# Patient Record
Sex: Female | Born: 1950 | Race: Black or African American | Hispanic: No | Marital: Married | Smoking: Former smoker
Health system: Southern US, Community
[De-identification: ages and names within clinical notes are randomized; demographics above are authoritative.]

## PROBLEM LIST (undated history)

## (undated) DIAGNOSIS — I10 Essential (primary) hypertension: Secondary | ICD-10-CM

## (undated) DIAGNOSIS — R5383 Other fatigue: Secondary | ICD-10-CM

## (undated) DIAGNOSIS — E739 Lactose intolerance, unspecified: Secondary | ICD-10-CM

## (undated) DIAGNOSIS — R5381 Other malaise: Secondary | ICD-10-CM

## (undated) DIAGNOSIS — F209 Schizophrenia, unspecified: Secondary | ICD-10-CM

## (undated) DIAGNOSIS — E785 Hyperlipidemia, unspecified: Secondary | ICD-10-CM

## (undated) DIAGNOSIS — G5600 Carpal tunnel syndrome, unspecified upper limb: Secondary | ICD-10-CM

## (undated) DIAGNOSIS — R7302 Impaired glucose tolerance (oral): Secondary | ICD-10-CM

## (undated) HISTORY — DX: Impaired glucose tolerance (oral): R73.02

## (undated) HISTORY — DX: Other fatigue: R53.83

## (undated) HISTORY — DX: Lactose intolerance, unspecified: E73.9

## (undated) HISTORY — DX: Carpal tunnel syndrome, unspecified upper limb: G56.00

## (undated) HISTORY — DX: Other malaise: R53.81

## (undated) HISTORY — DX: Schizophrenia, unspecified: F20.9

## (undated) HISTORY — DX: Essential (primary) hypertension: I10

## (undated) HISTORY — DX: Hyperlipidemia, unspecified: E78.5

---

## 1993-02-20 HISTORY — PX: OTHER SURGICAL HISTORY: SHX169

## 1997-02-20 HISTORY — PX: DILATION AND CURETTAGE OF UTERUS: SHX78

## 1998-01-28 ENCOUNTER — Ambulatory Visit (HOSPITAL_COMMUNITY): Admission: RE | Admit: 1998-01-28 | Discharge: 1998-01-28 | Payer: Self-pay | Admitting: Gynecology

## 1998-01-28 ENCOUNTER — Other Ambulatory Visit: Admission: RE | Admit: 1998-01-28 | Discharge: 1998-01-28 | Payer: Self-pay | Admitting: Gynecology

## 1998-02-01 ENCOUNTER — Ambulatory Visit (HOSPITAL_COMMUNITY): Admission: RE | Admit: 1998-02-01 | Discharge: 1998-02-01 | Payer: Self-pay | Admitting: Gynecology

## 1998-02-11 ENCOUNTER — Encounter: Payer: Self-pay | Admitting: Gynecology

## 1998-02-11 ENCOUNTER — Ambulatory Visit (HOSPITAL_COMMUNITY): Admission: RE | Admit: 1998-02-11 | Discharge: 1998-02-11 | Payer: Self-pay | Admitting: Gynecology

## 1998-02-15 ENCOUNTER — Ambulatory Visit (HOSPITAL_COMMUNITY): Admission: RE | Admit: 1998-02-15 | Discharge: 1998-02-15 | Payer: Self-pay | Admitting: Gynecology

## 2004-04-11 ENCOUNTER — Ambulatory Visit: Payer: Self-pay | Admitting: Family Medicine

## 2004-05-30 ENCOUNTER — Ambulatory Visit (HOSPITAL_COMMUNITY): Admission: RE | Admit: 2004-05-30 | Discharge: 2004-05-30 | Payer: Self-pay | Admitting: Internal Medicine

## 2005-04-17 ENCOUNTER — Ambulatory Visit: Payer: Self-pay | Admitting: Internal Medicine

## 2005-05-15 ENCOUNTER — Ambulatory Visit: Payer: Self-pay | Admitting: Internal Medicine

## 2005-06-26 ENCOUNTER — Ambulatory Visit: Payer: Self-pay | Admitting: Internal Medicine

## 2006-10-15 ENCOUNTER — Ambulatory Visit: Payer: Self-pay | Admitting: Internal Medicine

## 2006-12-18 ENCOUNTER — Telehealth (INDEPENDENT_AMBULATORY_CARE_PROVIDER_SITE_OTHER): Payer: Self-pay | Admitting: *Deleted

## 2007-10-15 ENCOUNTER — Encounter: Payer: Self-pay | Admitting: Internal Medicine

## 2007-12-13 ENCOUNTER — Encounter: Payer: Self-pay | Admitting: Internal Medicine

## 2008-06-25 ENCOUNTER — Ambulatory Visit: Payer: Self-pay | Admitting: Internal Medicine

## 2008-06-25 DIAGNOSIS — F209 Schizophrenia, unspecified: Secondary | ICD-10-CM

## 2008-06-25 DIAGNOSIS — E785 Hyperlipidemia, unspecified: Secondary | ICD-10-CM | POA: Insufficient documentation

## 2008-06-25 DIAGNOSIS — E739 Lactose intolerance, unspecified: Secondary | ICD-10-CM

## 2008-06-25 DIAGNOSIS — I10 Essential (primary) hypertension: Secondary | ICD-10-CM

## 2008-06-25 HISTORY — DX: Essential (primary) hypertension: I10

## 2008-06-25 HISTORY — DX: Lactose intolerance, unspecified: E73.9

## 2008-06-25 HISTORY — DX: Schizophrenia, unspecified: F20.9

## 2008-06-25 HISTORY — DX: Hyperlipidemia, unspecified: E78.5

## 2008-08-27 ENCOUNTER — Ambulatory Visit: Payer: Self-pay | Admitting: Internal Medicine

## 2008-08-27 DIAGNOSIS — R5381 Other malaise: Secondary | ICD-10-CM

## 2008-08-27 DIAGNOSIS — R5383 Other fatigue: Secondary | ICD-10-CM

## 2008-08-27 HISTORY — DX: Other malaise: R53.81

## 2008-08-27 LAB — CONVERTED CEMR LAB
ALT: 18 units/L (ref 0–35)
AST: 20 units/L (ref 0–37)
Albumin: 3.9 g/dL (ref 3.5–5.2)
Alkaline Phosphatase: 51 units/L (ref 39–117)
BUN: 15 mg/dL (ref 6–23)
Basophils Absolute: 0 10*3/uL (ref 0.0–0.1)
Basophils Relative: 0.4 % (ref 0.0–3.0)
Bilirubin Urine: NEGATIVE
Bilirubin, Direct: 0.1 mg/dL (ref 0.0–0.3)
CO2: 33 meq/L — ABNORMAL HIGH (ref 19–32)
Calcium: 9.5 mg/dL (ref 8.4–10.5)
Chloride: 101 meq/L (ref 96–112)
Cholesterol: 157 mg/dL (ref 0–200)
Creatinine, Ser: 1.3 mg/dL — ABNORMAL HIGH (ref 0.4–1.2)
Eosinophils Absolute: 0.1 10*3/uL (ref 0.0–0.7)
Eosinophils Relative: 1.2 % (ref 0.0–5.0)
Folate: 6.1 ng/mL
GFR calc non Af Amer: 54.52 mL/min (ref >60)
Glucose, Bld: 106 mg/dL — ABNORMAL HIGH (ref 70–99)
HCT: 41.1 % (ref 36.0–46.0)
HDL: 53.2 mg/dL (ref >39.00)
Hemoglobin, Urine: NEGATIVE
Hemoglobin: 13.8 g/dL (ref 12.0–15.0)
Hgb A1c MFr Bld: 5.9 % (ref 4.6–6.5)
Ketones, ur: NEGATIVE mg/dL
LDL Cholesterol: 77 mg/dL (ref 0–99)
Lymphocytes Relative: 34.8 % (ref 12.0–46.0)
Lymphs Abs: 2.4 10*3/uL (ref 0.7–4.0)
MCHC: 33.5 g/dL (ref 30.0–36.0)
MCV: 91.3 fL (ref 78.0–100.0)
Monocytes Absolute: 0.6 10*3/uL (ref 0.1–1.0)
Monocytes Relative: 9.4 % (ref 3.0–12.0)
Neutro Abs: 3.8 10*3/uL (ref 1.4–7.7)
Neutrophils Relative %: 54.2 % (ref 43.0–77.0)
Nitrite: NEGATIVE
Platelets: 216 10*3/uL (ref 150.0–400.0)
Potassium: 3.4 meq/L — ABNORMAL LOW (ref 3.5–5.1)
RBC: 4.51 M/uL (ref 3.87–5.11)
RDW: 13.1 % (ref 11.5–14.6)
Sed Rate: 12 mm/hr (ref 0–22)
Sodium: 143 meq/L (ref 135–145)
Specific Gravity, Urine: 1.02 (ref 1.000–1.030)
TSH: 2.17 microintl units/mL (ref 0.35–5.50)
Total Bilirubin: 0.8 mg/dL (ref 0.3–1.2)
Total CHOL/HDL Ratio: 3
Total Protein, Urine: NEGATIVE mg/dL
Total Protein: 7.3 g/dL (ref 6.0–8.3)
Triglycerides: 134 mg/dL (ref 0.0–149.0)
Urine Glucose: NEGATIVE mg/dL
Urobilinogen, UA: 0.2 (ref 0.0–1.0)
VLDL: 26.8 mg/dL (ref 0.0–40.0)
Vitamin B-12: 222 pg/mL (ref 211–911)
WBC: 6.9 10*3/uL (ref 4.5–10.5)
pH: 5.5 (ref 5.0–8.0)

## 2009-04-20 ENCOUNTER — Telehealth: Payer: Self-pay | Admitting: Internal Medicine

## 2009-06-07 ENCOUNTER — Ambulatory Visit (HOSPITAL_COMMUNITY): Admission: RE | Admit: 2009-06-07 | Discharge: 2009-06-07 | Payer: Self-pay | Admitting: Internal Medicine

## 2009-06-07 LAB — HM MAMMOGRAPHY: HM Mammogram: NEGATIVE

## 2010-03-11 ENCOUNTER — Telehealth: Payer: Self-pay | Admitting: Internal Medicine

## 2010-03-22 NOTE — Progress Notes (Signed)
    Immunization History:  Influenza Immunization History:    Influenza:  historical (02/09/2009)

## 2010-03-24 NOTE — Progress Notes (Signed)
Summary: Rx refill req  Phone Note Refill Request Message from:  Patient on March 11, 2010 10:06 AM  Refills Requested: Medication #1:  LOVASTATIN 40 MG  TABS 1 by mouth once daily   Dosage confirmed as above?Dosage Confirmed   Supply Requested: 3 months  Medication #2:  LISINOPRIL-HYDROCHLOROTHIAZIDE 20-12.5 MG TABS 2 by mouth once daily   Dosage confirmed as above?Dosage Confirmed   Supply Requested: 3 months Pt has appt scheduled in April 2012   Method Requested: Electronic Initial call taken by: Margaret Pyle, CMA,  March 11, 2010 10:06 AM    Prescriptions: LISINOPRIL-HYDROCHLOROTHIAZIDE 20-12.5 MG TABS (LISINOPRIL-HYDROCHLOROTHIAZIDE) 2 by mouth once daily  #180 Tablet x 2   Entered by:   Margaret Pyle, CMA   Authorized by:   Corwin Levins MD   Signed by:   Margaret Pyle, CMA on 03/11/2010   Method used:   Electronically to        [facility]  [address] [phone]* (retail)       [address]       [place], [place]  [id number]       Ph: [phone] or [phone]       Fax: [phone]   RxID:   [id number] LOVASTATIN 40 MG  TABS (LOVASTATIN) 1 by mouth once daily  #30 Tablet x 2   Entered by:   Margaret Pyle, CMA   Authorized by:   Corwin Levins MD   Signed by:   Margaret Pyle, CMA on 03/11/2010   Method used:   [facility]ically to        [facility]  [address] [phone]* (retail)       [address]       [place], [place]  [id number]       Ph: [phone] or [phone]       Fax: [phone]   RxID:   [id number]

## 2010-06-12 ENCOUNTER — Encounter: Payer: Self-pay | Admitting: Internal Medicine

## 2010-06-12 DIAGNOSIS — R7302 Impaired glucose tolerance (oral): Secondary | ICD-10-CM

## 2010-06-12 DIAGNOSIS — Z0000 Encounter for general adult medical examination without abnormal findings: Secondary | ICD-10-CM | POA: Insufficient documentation

## 2010-06-12 HISTORY — DX: Impaired glucose tolerance (oral): R73.02

## 2010-06-15 ENCOUNTER — Encounter: Payer: Self-pay | Admitting: Internal Medicine

## 2010-06-15 ENCOUNTER — Other Ambulatory Visit (INDEPENDENT_AMBULATORY_CARE_PROVIDER_SITE_OTHER): Payer: Medicare Other

## 2010-06-15 ENCOUNTER — Ambulatory Visit (INDEPENDENT_AMBULATORY_CARE_PROVIDER_SITE_OTHER): Payer: Medicare Other | Admitting: Internal Medicine

## 2010-06-15 VITALS — BP 108/78 | HR 77 | Temp 98.0°F | Ht 67.0 in | Wt 180.0 lb

## 2010-06-15 DIAGNOSIS — R7309 Other abnormal glucose: Secondary | ICD-10-CM

## 2010-06-15 DIAGNOSIS — R7302 Impaired glucose tolerance (oral): Secondary | ICD-10-CM

## 2010-06-15 DIAGNOSIS — R5381 Other malaise: Secondary | ICD-10-CM

## 2010-06-15 DIAGNOSIS — E785 Hyperlipidemia, unspecified: Secondary | ICD-10-CM

## 2010-06-15 DIAGNOSIS — R5383 Other fatigue: Secondary | ICD-10-CM

## 2010-06-15 DIAGNOSIS — I10 Essential (primary) hypertension: Secondary | ICD-10-CM

## 2010-06-15 LAB — HEPATIC FUNCTION PANEL
AST: 15 U/L (ref 0–37)
Albumin: 4 g/dL (ref 3.5–5.2)
Total Bilirubin: 1 mg/dL (ref 0.3–1.2)

## 2010-06-15 LAB — BASIC METABOLIC PANEL
BUN: 12 mg/dL (ref 6–23)
Calcium: 9.7 mg/dL (ref 8.4–10.5)
Creatinine, Ser: 1.2 mg/dL (ref 0.4–1.2)
GFR: 60.57 mL/min (ref >60.00)
Potassium: 3.6 mEq/L (ref 3.5–5.1)

## 2010-06-15 LAB — CBC WITH DIFFERENTIAL/PLATELET
Basophils Relative: 0.3 % (ref 0.0–3.0)
Eosinophils Relative: 0.6 % (ref 0.0–5.0)
HCT: 42.3 % (ref 36.0–46.0)
Hemoglobin: 14.5 g/dL (ref 12.0–15.0)
Lymphs Abs: 1.6 10*3/uL (ref 0.7–4.0)
MCV: 89.4 fl (ref 78.0–100.0)
Monocytes Absolute: 0.6 10*3/uL (ref 0.1–1.0)
Monocytes Relative: 9.6 % (ref 3.0–12.0)
Neutro Abs: 4 10*3/uL (ref 1.4–7.7)
RBC: 4.73 Mil/uL (ref 3.87–5.11)
WBC: 6.3 10*3/uL (ref 4.5–10.5)

## 2010-06-15 MED ORDER — BENZTROPINE MESYLATE 2 MG PO TABS: 2.0000 mg | ORAL_TABLET | Freq: Every day | ORAL | Status: DC

## 2010-06-15 MED ORDER — LOVASTATIN 40 MG PO TABS: 40.0000 mg | ORAL_TABLET | Freq: Every day | ORAL | Status: DC

## 2010-06-15 MED ORDER — LISINOPRIL-HYDROCHLOROTHIAZIDE 20-12.5 MG PO TABS: 2.0000 | ORAL_TABLET | Freq: Every day | ORAL | Status: DC

## 2010-06-15 NOTE — Assessment & Plan Note (Signed)
stable overall by hx and exam, most recent lab reviewed with pt, and pt to continue medical treatment as before  Lab Results  Component Value Date   WBC 6.9 08/27/2008   HGB 13.8 08/27/2008   HCT 41.1 08/27/2008   PLT 216.0 08/27/2008   CHOL 157 08/27/2008   TRIG 134.0 08/27/2008   HDL 53.20 08/27/2008   ALT 18 08/27/2008   AST 20 08/27/2008   NA 143 08/27/2008   K 3.4* 08/27/2008   CL 101 08/27/2008   CREATININE 1.3* 08/27/2008   BUN 15 08/27/2008   CO2 33* 08/27/2008   TSH 2.17 08/27/2008   HGBA1C 5.9 08/27/2008   To re-check labs today

## 2010-06-15 NOTE — Assessment & Plan Note (Signed)
stable overall by hx and exam, most recent lab reviewed with pt, and pt to continue medical treatment as before  Lab Results  Component Value Date   LDLCALC 77 08/27/2008   Ton cont diet and meds

## 2010-06-15 NOTE — Progress Notes (Signed)
Quick Note:  Voice message left on [employer] system - lab is negative, normal or otherwise stable, pt to continue same tx ______ 

## 2010-06-15 NOTE — Patient Instructions (Addendum)
Continue all other medications as before Please keep your appointments with your specialists as you have planned - mental health Please go to LAB in the Basement for the blood and/or urine tests to be done today Please call the number on the Blue Card (the [employer] System) for results of testing in 2-3 days Please return in 1 year for your yearly visit, or sooner if needed Your prescriptions were sent to the pharmacy (all except the haldol)

## 2010-06-15 NOTE — Progress Notes (Signed)
  Subjective:    Patient ID: [name] [name], female    DOB: [birth year], 60 y.o.   MRN: [id number]  HPI  Here to f/u; overall doing ok,  Pt denies chest pain, increased sob or doe, wheezing, orthopnea, PND, increased LE swelling, palpitations, dizziness or syncope.  Pt denies new neurological symptoms such as new headache, or facial or extremity weakness or numbness   Pt denies polydipsia, polyuria, or low sugar symptoms such as weakness or confusion improved with po intake.  Pt states overall good compliance with meds, trying to follow lower cholesterol, diabetic diet, wt overall stable but little exercise however.  Does have sense of ongoing fatigue, but denies signficant hypersomnolence.  Pt denies fever, wt loss, night sweats, loss of appetite, or other constitutional symptoms  Overall good compliance with treatment, and good medicine tolerability.  Denies worsening depressive symptoms, suicidal ideation, or panic Past Medical History  Diagnosis Date  . CTS (carpal tunnel syndrome)     bilateral  . GLUCOSE INTOLERANCE 06/25/2008  . HYPERLIPIDEMIA 06/25/2008  . SCHIZOPHRENIA 06/25/2008  . HYPERTENSION 06/25/2008  . FATIGUE 08/27/2008  . Impaired glucose tolerance 06/12/2010   Past Surgical History  Procedure Date  . S/p cervical procedure 1995    LEEP  . Dilation and curettage of uterus 1999    hx of after miscarriage    reports that she has quit smoking. She does not have any smokeless tobacco history on file. She reports that she does not drink alcohol. Her drug history not on file. family history includes Hypertension in her other and Stroke in her other. Allergies  Allergen Reactions  . Nabumetone     REACTION: rash   Current Outpatient Prescriptions on File Prior to Visit  Medication Sig Dispense Refill  . aspirin 81 MG EC tablet Take 81 mg by mouth daily.        . haloperidol (HALDOL) 10 MG tablet Take 10 mg by mouth daily.          Review of Systems Review of Systems  Constitutional:  Negative for diaphoresis and unexpected weight change.  HENT: Negative for drooling and tinnitus.   Eyes: Negative for photophobia and visual disturbance.  Respiratory: Negative for choking and stridor.   Gastrointestinal: Negative for vomiting and blood in stool.  Genitourinary: Negative for hematuria and decreased urine volume.  Musculoskeletal: Negative for gait problem.  Skin: Negative for color change and wound.  Neurological: Negative for tremors and numbness.  Psychiatric/Behavioral: Negative for decreased concentration. The patient is not hyperactive.       Objective:   Physical Exam BP 108/78  Pulse 77  Temp(Src) 98 F (36.7 C) (Oral)  Ht 5\' 7"  (1.702 m)  Wt 180 lb (81.647 kg)  BMI 28.19 kg/m2  SpO2 97% Physical Exam  VS noted Constitutional: Pt appears well-developed and well-nourished.  HENT: Head: Normocephalic.  Right Ear: External ear normal.  Left Ear: External ear normal.  Eyes: Conjunctivae and EOM are normal. Pupils are equal, round, and reactive to light.  Neck: Normal range of motion. Neck supple.  Cardiovascular: Normal rate and regular rhythm.   Pulmonary/Chest: Effort normal and breath sounds normal.  Abd:  Soft, NT, non-distended, + BS Neurological: Pt is alert. No cranial nerve deficit.  Skin: Skin is warm. No erythema.  Psychiatric: Pt behavior is normal. 1+ nervous, not depressed appearing        Assessment & Plan:

## 2010-06-15 NOTE — Assessment & Plan Note (Signed)
stable overall by hx and exam, most recent lab reviewed with pt, and pt to continue medical treatment as before  Lab Results  Component Value Date   HGBA1C 5.9 08/27/2008

## 2010-06-15 NOTE — Assessment & Plan Note (Signed)
Etiology unclear, Exam otherwise benign, to check labs as documented, follow with expectant management  

## 2010-06-19 ENCOUNTER — Encounter: Payer: Self-pay | Admitting: Internal Medicine

## 2011-01-17 ENCOUNTER — Other Ambulatory Visit: Payer: Self-pay | Admitting: Internal Medicine

## 2011-07-23 ENCOUNTER — Other Ambulatory Visit: Payer: Self-pay | Admitting: Internal Medicine

## 2011-08-23 ENCOUNTER — Other Ambulatory Visit: Payer: Self-pay | Admitting: Internal Medicine

## 2011-09-19 ENCOUNTER — Other Ambulatory Visit: Payer: Self-pay | Admitting: Internal Medicine

## 2011-10-11 ENCOUNTER — Ambulatory Visit (INDEPENDENT_AMBULATORY_CARE_PROVIDER_SITE_OTHER): Payer: Medicare Other | Admitting: Internal Medicine

## 2011-10-11 ENCOUNTER — Other Ambulatory Visit (INDEPENDENT_AMBULATORY_CARE_PROVIDER_SITE_OTHER): Payer: Medicare Other

## 2011-10-11 VITALS — BP 122/80 | HR 70 | Temp 97.3°F | Ht 68.0 in | Wt 177.5 lb

## 2011-10-11 DIAGNOSIS — R7302 Impaired glucose tolerance (oral): Secondary | ICD-10-CM

## 2011-10-11 DIAGNOSIS — I10 Essential (primary) hypertension: Secondary | ICD-10-CM

## 2011-10-11 DIAGNOSIS — E785 Hyperlipidemia, unspecified: Secondary | ICD-10-CM

## 2011-10-11 DIAGNOSIS — Z136 Encounter for screening for cardiovascular disorders: Secondary | ICD-10-CM

## 2011-10-11 DIAGNOSIS — R7309 Other abnormal glucose: Secondary | ICD-10-CM

## 2011-10-11 LAB — CBC WITH DIFFERENTIAL/PLATELET
Basophils Relative: 0.5 % (ref 0.0–3.0)
Eosinophils Relative: 0.7 % (ref 0.0–5.0)
HCT: 43.7 % (ref 36.0–46.0)
MCV: 91.9 fl (ref 78.0–100.0)
Monocytes Absolute: 0.5 10*3/uL (ref 0.1–1.0)
Monocytes Relative: 8.4 % (ref 3.0–12.0)
Neutrophils Relative %: 61.4 % (ref 43.0–77.0)
RBC: 4.76 Mil/uL (ref 3.87–5.11)
WBC: 6.6 10*3/uL (ref 4.5–10.5)

## 2011-10-11 LAB — URINALYSIS, ROUTINE W REFLEX MICROSCOPIC
Bilirubin Urine: NEGATIVE
Nitrite: NEGATIVE
Specific Gravity, Urine: 1.01 (ref 1.000–1.030)
Total Protein, Urine: NEGATIVE
pH: 6 (ref 5.0–8.0)

## 2011-10-11 LAB — BASIC METABOLIC PANEL
BUN: 10 mg/dL (ref 6–23)
Creatinine, Ser: 1.2 mg/dL (ref 0.4–1.2)
GFR: 56.94 mL/min — ABNORMAL LOW (ref >60.00)

## 2011-10-11 LAB — LIPID PANEL
LDL Cholesterol: 59 mg/dL (ref 0–99)
Total CHOL/HDL Ratio: 2
Triglycerides: 91 mg/dL (ref 0.0–149.0)

## 2011-10-11 LAB — HEPATIC FUNCTION PANEL
ALT: 15 U/L (ref 0–35)
Total Bilirubin: 0.8 mg/dL (ref 0.3–1.2)

## 2011-10-11 MED ORDER — LOVASTATIN 40 MG PO TABS: 40.0000 mg | ORAL_TABLET | Freq: Every day | ORAL | Status: DC

## 2011-10-11 MED ORDER — LISINOPRIL-HYDROCHLOROTHIAZIDE 20-12.5 MG PO TABS: ORAL_TABLET | ORAL | Status: DC

## 2011-10-11 NOTE — Assessment & Plan Note (Signed)
With recent wt loss likely improved, for a1c

## 2011-10-11 NOTE — Assessment & Plan Note (Addendum)
ECG reviewed as per emr, stable overall by hx and exam, most recent data reviewed with pt, and pt to continue medical treatment as before BP Readings from Last 3 Encounters:  10/11/11 122/80  06/15/10 108/78  08/27/08 128/72

## 2011-10-11 NOTE — Patient Instructions (Addendum)
Please remember to followup with your GYN for the yearly pap smear and/or mammogram Very good on the weight loss from last year! Please go to LAB in the Basement for the blood and/or urine tests to be done today You will be contacted by phone if any changes need to be made immediately.  Otherwise, you will receive a letter about your results with an explanation. Your medications were refilled today Please have the pharmacy call with any refills you may need. Please continue your efforts at being more active, low cholesterol diet, and weight control. Please return in 1 year for your yearly visit, or sooner if needed

## 2011-10-14 ENCOUNTER — Encounter: Payer: Self-pay | Admitting: Internal Medicine

## 2011-10-14 NOTE — Assessment & Plan Note (Signed)
stable overall by hx and exam, most recent data reviewed with pt, and pt to continue medical treatment as before Lab Results  Component Value Date   LDLCALC 59 10/11/2011

## 2011-10-14 NOTE — Progress Notes (Signed)
Subjective:    Patient ID: [name] [name], female    DOB: 13-Apr-1950, 61 y.o.   MRN: [id number]  HPI  Here to f/u; overall doing ok,  Pt denies chest pain, increased sob or doe, wheezing, orthopnea, PND, increased LE swelling, palpitations, dizziness or syncope.  Pt denies new neurological symptoms such as new headache, or facial or extremity weakness or numbness   Pt denies polydipsia, polyuria, or low sugar symptoms such as weakness or confusion improved with po intake.  Pt states overall good compliance with meds, trying to follow lower cholesterol, diabetic diet, wt overall down not to 177 with better diet, but little exercise however.  Needs med refills.   Pt denies fever, night sweats, loss of appetite, or other constitutional symptoms  Overall good compliance with treatment, and good medicine tolerability.  No new complaints Past Medical History  Diagnosis Date  . CTS (carpal tunnel syndrome)     bilateral  . GLUCOSE INTOLERANCE 06/25/2008  . HYPERLIPIDEMIA 06/25/2008  . SCHIZOPHRENIA 06/25/2008  . HYPERTENSION 06/25/2008  . FATIGUE 08/27/2008  . Impaired glucose tolerance 06/12/2010   Past Surgical History  Procedure Date  . S/p cervical procedure 1995    LEEP  . Dilation and curettage of uterus 1999    hx of after miscarriage    reports that she has quit smoking. She does not have any smokeless tobacco history on file. She reports that she does not drink alcohol. Her drug history not on file. family history includes Hypertension in her other and Stroke in her other. Allergies  Allergen Reactions  . Nabumetone     REACTION: rash   Current Outpatient Prescriptions on File Prior to Visit  Medication Sig Dispense Refill  . aspirin 81 MG EC tablet Take 81 mg by mouth daily.        . benztropine (COGENTIN) 2 MG tablet Take 1 tablet (2 mg total) by mouth daily.  90 tablet  3  . haloperidol (HALDOL) 10 MG tablet Take 10 mg by mouth daily.        Marland Kitchen lisinopril-hydrochlorothiazide  (PRINZIDE,ZESTORETIC) 20-12.5 MG per tablet TAKE 2 TABLETS BY MOUTH DAILY  180 tablet  3  . lovastatin (MEVACOR) 40 MG tablet Take 1 tablet (40 mg total) by mouth daily.  90 tablet  3   Review of Systems Review of Systems  Constitutional: Negative for diaphoresis and unexpected weight change.  HENT: Negative for drooling and tinnitus.   Eyes: Negative for photophobia and visual disturbance.  Respiratory: Negative for choking and stridor.   Gastrointestinal: Negative for vomiting and blood in stool.  Genitourinary: Negative for hematuria and decreased urine volume.  Musculoskeletal: Negative for gait problem.  Skin: Negative for color change and wound.  Neurological: Negative for tremors and numbness.  Psychiatric/Behavioral: Negative for decreased concentration. The patient is not hyperactive.      Objective:   Physical Exam BP 122/80  Pulse 70  Temp 97.3 F (36.3 C) (Oral)  Ht 5\' 8"  (1.727 m)  Wt 177 lb 8 oz (80.513 kg)  BMI 26.99 kg/m2  SpO2 97% Physical Exam  VS noted Constitutional: Pt appears well-developed and well-nourished.  HENT: Head: Normocephalic.  Right Ear: External ear normal.  Left Ear: External ear normal.  Eyes: Conjunctivae and EOM are normal. Pupils are equal, round, and reactive to light.  Neck: Normal range of motion. Neck supple.  Cardiovascular: Normal rate and regular rhythm.   Pulmonary/Chest: Effort normal and breath sounds normal.  Abd:  Soft, NT,  non-distended, + BS Neurological: Pt is alert. Not confused.  Skin: Skin is warm. No erythema.  Psychiatric: Pt behavior is normal. Thought content normal.     Assessment & Plan:

## 2011-11-03 ENCOUNTER — Other Ambulatory Visit: Payer: Self-pay | Admitting: Internal Medicine

## 2012-02-20 ENCOUNTER — Other Ambulatory Visit: Payer: Self-pay

## 2012-02-20 MED ORDER — LISINOPRIL-HYDROCHLOROTHIAZIDE 20-12.5 MG PO TABS: 2.0000 | ORAL_TABLET | Freq: Every day | ORAL | Status: DC

## 2012-10-28 ENCOUNTER — Other Ambulatory Visit: Payer: Self-pay | Admitting: Internal Medicine

## 2012-10-29 ENCOUNTER — Other Ambulatory Visit: Payer: Self-pay

## 2012-10-29 MED ORDER — LISINOPRIL-HYDROCHLOROTHIAZIDE 20-12.5 MG PO TABS: 2.0000 | ORAL_TABLET | Freq: Every day | ORAL | Status: DC

## 2012-12-16 ENCOUNTER — Other Ambulatory Visit: Payer: Self-pay | Admitting: Internal Medicine

## 2012-12-17 ENCOUNTER — Telehealth: Payer: Self-pay | Admitting: Internal Medicine

## 2012-12-17 MED ORDER — LOVASTATIN 40 MG PO TABS: 40.0000 mg | ORAL_TABLET | Freq: Every day | ORAL | Status: DC

## 2012-12-17 NOTE — Telephone Encounter (Signed)
Called the patient informed that she needs to schedule appointment  To continue getting refills.  The patient agreed to do so and I did send in #30 days of lovastatin to her pharmacy.

## 2012-12-17 NOTE — Telephone Encounter (Signed)
12/17/2012  Pt left message requesting refill for RX - lovastatin.  Contact # [phone]

## 2012-12-25 ENCOUNTER — Ambulatory Visit: Payer: Medicare Other | Admitting: Internal Medicine

## 2013-01-29 ENCOUNTER — Other Ambulatory Visit: Payer: Self-pay | Admitting: Internal Medicine

## 2013-02-06 ENCOUNTER — Other Ambulatory Visit: Payer: Self-pay | Admitting: Internal Medicine

## 2013-02-07 ENCOUNTER — Other Ambulatory Visit: Payer: Self-pay | Admitting: *Deleted

## 2013-02-07 MED ORDER — LOVASTATIN 40 MG PO TABS: 40.0000 mg | ORAL_TABLET | Freq: Every day | ORAL | Status: DC

## 2013-02-12 ENCOUNTER — Other Ambulatory Visit (INDEPENDENT_AMBULATORY_CARE_PROVIDER_SITE_OTHER): Payer: Medicare Other

## 2013-02-12 ENCOUNTER — Encounter: Payer: Self-pay | Admitting: Internal Medicine

## 2013-02-12 ENCOUNTER — Ambulatory Visit (INDEPENDENT_AMBULATORY_CARE_PROVIDER_SITE_OTHER): Payer: Medicare Other | Admitting: Internal Medicine

## 2013-02-12 VITALS — BP 114/80 | HR 80 | Temp 97.7°F | Ht 68.0 in | Wt 174.2 lb

## 2013-02-12 DIAGNOSIS — Z0000 Encounter for general adult medical examination without abnormal findings: Secondary | ICD-10-CM

## 2013-02-12 DIAGNOSIS — R7309 Other abnormal glucose: Secondary | ICD-10-CM

## 2013-02-12 DIAGNOSIS — E785 Hyperlipidemia, unspecified: Secondary | ICD-10-CM

## 2013-02-12 DIAGNOSIS — R7302 Impaired glucose tolerance (oral): Secondary | ICD-10-CM

## 2013-02-12 DIAGNOSIS — Z23 Encounter for immunization: Secondary | ICD-10-CM

## 2013-02-12 DIAGNOSIS — I10 Essential (primary) hypertension: Secondary | ICD-10-CM

## 2013-02-12 LAB — CBC WITH DIFFERENTIAL/PLATELET
Basophils Absolute: 0 10*3/uL (ref 0.0–0.1)
Basophils Relative: 0.3 % (ref 0.0–3.0)
Eosinophils Absolute: 0 10*3/uL (ref 0.0–0.7)
Hemoglobin: 14.9 g/dL (ref 12.0–15.0)
MCHC: 33.4 g/dL (ref 30.0–36.0)
MCV: 88.3 fl (ref 78.0–100.0)
Monocytes Absolute: 0.5 10*3/uL (ref 0.1–1.0)
Monocytes Relative: 8.3 % (ref 3.0–12.0)
Neutro Abs: 3.7 10*3/uL (ref 1.4–7.7)
RBC: 5.04 Mil/uL (ref 3.87–5.11)
RDW: 14.2 % (ref 11.5–14.6)

## 2013-02-12 LAB — URINALYSIS, ROUTINE W REFLEX MICROSCOPIC
Bilirubin Urine: NEGATIVE
Hgb urine dipstick: NEGATIVE
Ketones, ur: NEGATIVE
Specific Gravity, Urine: 1.01 (ref 1.000–1.030)
Total Protein, Urine: NEGATIVE
Urine Glucose: NEGATIVE
Urobilinogen, UA: 0.2 (ref 0.0–1.0)

## 2013-02-12 LAB — BASIC METABOLIC PANEL
BUN: 9 mg/dL (ref 6–23)
CO2: 30 mEq/L (ref 19–32)
Calcium: 9.6 mg/dL (ref 8.4–10.5)
Chloride: 100 mEq/L (ref 96–112)
Creatinine, Ser: 1.1 mg/dL (ref 0.4–1.2)
Sodium: 138 mEq/L (ref 135–145)

## 2013-02-12 LAB — LIPID PANEL
Cholesterol: 183 mg/dL (ref 0–200)
Triglycerides: 105 mg/dL (ref 0.0–149.0)

## 2013-02-12 LAB — HEPATIC FUNCTION PANEL
ALT: 13 U/L (ref 0–35)
Alkaline Phosphatase: 61 U/L (ref 39–117)
Bilirubin, Direct: 0.2 mg/dL (ref 0.0–0.3)
Total Bilirubin: 0.8 mg/dL (ref 0.3–1.2)
Total Protein: 7.6 g/dL (ref 6.0–8.3)

## 2013-02-12 MED ORDER — ESCITALOPRAM OXALATE 10 MG PO TABS: 5.0000 mg | ORAL_TABLET | Freq: Every day | ORAL | Status: DC

## 2013-02-12 MED ORDER — LOVASTATIN 40 MG PO TABS: 40.0000 mg | ORAL_TABLET | Freq: Every day | ORAL | Status: DC

## 2013-02-12 MED ORDER — LISINOPRIL-HYDROCHLOROTHIAZIDE 20-12.5 MG PO TABS: 2.0000 | ORAL_TABLET | Freq: Every day | ORAL | Status: DC

## 2013-02-12 NOTE — Patient Instructions (Addendum)
You had the tetanus shot today (The Td shot) No new medications or treatment today Please continue all other medications as before, and refills have been done if requested. Please have the pharmacy call with any other refills you may need. Please continue your efforts at being more active, low cholesterol diet, and weight control. You are otherwise up to date with prevention measures today.  Please go to the LAB in the Basement (turn left off the elevator) for the tests to be done today You will be contacted by phone if any changes need to be made immediately.  Otherwise, you will receive a letter about your results with an explanation, but please check with MyChart first.  You will be contacted regarding the referral for: screening mammogram at [facility]  Please keep your appointments with your specialists as you have planned - psychiatry  Please return in 1 year for your yearly visit, or sooner if needed

## 2013-02-12 NOTE — Assessment & Plan Note (Signed)
stable overall by history and exam, recent data reviewed with pt, and pt to continue medical treatment as before,  to f/u any worsening symptoms or concerns Lab Results  Component Value Date   LDLCALC 59 10/11/2011

## 2013-02-12 NOTE — Addendum Note (Signed)
Addended by: Scharlene Gloss B on: 02/12/2013 09:39 AM   Modules accepted: Orders

## 2013-02-12 NOTE — Assessment & Plan Note (Signed)
stable overall by history and exam, recent data reviewed with pt, and pt to continue medical treatment as before,  to f/u any worsening symptoms or concerns Lab Results  Component Value Date   HGBA1C 5.7 10/11/2011

## 2013-02-12 NOTE — Assessment & Plan Note (Signed)
stable overall by history and exam, recent data reviewed with pt, and pt to continue medical treatment as before,  to f/u any worsening symptoms or concerns BP Readings from Last 3 Encounters:  02/12/13 114/80  10/11/11 122/80  06/15/10 108/78

## 2013-02-12 NOTE — Progress Notes (Signed)
Subjective:    Patient ID: [name] [name], female    DOB: Jan 31, 1951, 62 y.o.   MRN: [id number]  HPI  Here for yearly f/u;  Overall doing ok;  Pt denies CP, worsening SOB, DOE, wheezing, orthopnea, PND, worsening LE edema, palpitations, dizziness or syncope.  Pt denies neurological change such as new headache, facial or extremity weakness.  Pt denies polydipsia, polyuria, or low sugar symptoms. Pt states overall good compliance with treatment and medications, good tolerability, and has been trying to follow lower cholesterol diet.  Pt denies worsening depressive symptoms, suicidal ideation or panic. No fever, night sweats, wt loss, loss of appetite, or other constitutional symptoms.  Pt states good ability with ADL's, has low fall risk, home safety reviewed and adequate, no other significant changes in hearing or vision, and only occasionally active with exercise. No new complaints. Now on lexapro 5 qd per psychiatry Past Medical History  Diagnosis Date  . CTS (carpal tunnel syndrome)     bilateral  . GLUCOSE INTOLERANCE 06/25/2008  . HYPERLIPIDEMIA 06/25/2008  . SCHIZOPHRENIA 06/25/2008  . HYPERTENSION 06/25/2008  . FATIGUE 08/27/2008  . Impaired glucose tolerance 06/12/2010   Past Surgical History  Procedure Laterality Date  . S/p cervical procedure  1995    LEEP  . Dilation and curettage of uterus  1999    hx of after miscarriage    reports that she has quit smoking. She does not have any smokeless tobacco history on file. She reports that she does not drink alcohol. Her drug history is not on file. family history includes Hypertension in her other; Stroke in her other. Allergies  Allergen Reactions  . Nabumetone     REACTION: rash   Current Outpatient Prescriptions on File Prior to Visit  Medication Sig Dispense Refill  . aspirin 81 MG EC tablet Take 81 mg by mouth daily.        . benztropine (COGENTIN) 2 MG tablet Take 1 tablet (2 mg total) by mouth daily.  90 tablet  3  . haloperidol  (HALDOL) 10 MG tablet Take 10 mg by mouth daily.         No current facility-administered medications on file prior to visit.   Review of Systems Constitutional: Negative for diaphoresis, activity change, appetite change or unexpected weight change.  HENT: Negative for hearing loss, ear pain, facial swelling, mouth sores and neck stiffness.   Eyes: Negative for pain, redness and visual disturbance.  Respiratory: Negative for shortness of breath and wheezing.   Cardiovascular: Negative for chest pain and palpitations.  Gastrointestinal: Negative for diarrhea, blood in stool, abdominal distention or other pain Genitourinary: Negative for hematuria, flank pain or change in urine volume.  Musculoskeletal: Negative for myalgias and joint swelling.  Skin: Negative for color change and wound.  Neurological: Negative for syncope and numbness. other than noted Hematological: Negative for adenopathy.  Psychiatric/Behavioral: Negative for hallucinations, self-injury, decreased concentration and agitation.      Objective:   Physical Exam BP 114/80  Pulse 80  Temp(Src) 97.7 F (36.5 C) (Oral)  Ht 5\' 8"  (1.727 m)  Wt 174 lb 4 oz (79.039 kg)  BMI 26.50 kg/m2  SpO2 96% VS noted,  Constitutional: Pt is oriented to person, place, and time. Appears well-developed and well-nourished.  Head: Normocephalic and atraumatic.  Right Ear: External ear normal.  Left Ear: External ear normal.  Nose: Nose normal.  Mouth/Throat: Oropharynx is clear and moist.  Eyes: Conjunctivae and EOM are normal. Pupils are equal,  round, and reactive to light.  Neck: Normal range of motion. Neck supple. No JVD present. No tracheal deviation present.  Cardiovascular: Normal rate, regular rhythm, normal heart sounds and intact distal pulses.   Pulmonary/Chest: Effort normal and breath sounds normal.  Abdominal: Soft. Bowel sounds are normal. There is no tenderness. No HSM  Musculoskeletal: Normal range of motion. Exhibits  no edema.  Lymphadenopathy:  Has no cervical adenopathy.  Neurological: Pt is alert and oriented to person, place, and time. Pt has normal reflexes. No cranial nerve deficit.  Skin: Skin is warm and dry. No rash noted.  Psychiatric:  Has  normal mood and affect. Behavior is normal.     Assessment & Plan:

## 2013-02-12 NOTE — Progress Notes (Signed)
Pre-visit discussion using our clinic review tool. No additional management support is needed unless otherwise documented below in the visit note.  

## 2013-02-14 ENCOUNTER — Encounter: Payer: Self-pay | Admitting: Internal Medicine

## 2013-02-14 LAB — HEMOGLOBIN A1C: Hgb A1c MFr Bld: 5.8 % (ref 4.6–6.5)

## 2013-03-26 DIAGNOSIS — F209 Schizophrenia, unspecified: Secondary | ICD-10-CM | POA: Diagnosis not present

## 2013-03-26 DIAGNOSIS — F202 Catatonic schizophrenia: Secondary | ICD-10-CM | POA: Diagnosis not present

## 2013-05-09 ENCOUNTER — Ambulatory Visit: Payer: Medicare Other | Admitting: Internal Medicine

## 2013-06-25 DIAGNOSIS — F209 Schizophrenia, unspecified: Secondary | ICD-10-CM | POA: Diagnosis not present

## 2013-09-22 DIAGNOSIS — F209 Schizophrenia, unspecified: Secondary | ICD-10-CM | POA: Diagnosis not present

## 2014-01-02 ENCOUNTER — Ambulatory Visit (INDEPENDENT_AMBULATORY_CARE_PROVIDER_SITE_OTHER): Payer: Medicare Other

## 2014-01-02 DIAGNOSIS — Z23 Encounter for immunization: Secondary | ICD-10-CM

## 2014-01-23 DIAGNOSIS — F209 Schizophrenia, unspecified: Secondary | ICD-10-CM | POA: Diagnosis not present

## 2014-03-02 ENCOUNTER — Telehealth: Payer: Self-pay | Admitting: Internal Medicine

## 2014-03-02 MED ORDER — LISINOPRIL-HYDROCHLOROTHIAZIDE 20-12.5 MG PO TABS: 2.0000 | ORAL_TABLET | Freq: Every day | ORAL | Status: DC

## 2014-03-02 NOTE — Telephone Encounter (Signed)
Sent enough until march appt...[name] [name]

## 2014-03-02 NOTE — Telephone Encounter (Signed)
Patient has appoint on march 4th.  She is requesting refill of lisinopril sent to [facility] Ph [phone].

## 2014-04-21 DIAGNOSIS — F209 Schizophrenia, unspecified: Secondary | ICD-10-CM | POA: Diagnosis not present

## 2014-04-24 ENCOUNTER — Encounter: Payer: Self-pay | Admitting: Internal Medicine

## 2014-04-24 ENCOUNTER — Other Ambulatory Visit (INDEPENDENT_AMBULATORY_CARE_PROVIDER_SITE_OTHER): Payer: Medicare Other

## 2014-04-24 ENCOUNTER — Ambulatory Visit (INDEPENDENT_AMBULATORY_CARE_PROVIDER_SITE_OTHER): Payer: Medicare Other | Admitting: Internal Medicine

## 2014-04-24 VITALS — BP 118/76 | HR 79 | Temp 97.7°F | Ht 68.0 in | Wt 175.0 lb

## 2014-04-24 DIAGNOSIS — E785 Hyperlipidemia, unspecified: Secondary | ICD-10-CM | POA: Diagnosis not present

## 2014-04-24 DIAGNOSIS — R7302 Impaired glucose tolerance (oral): Secondary | ICD-10-CM

## 2014-04-24 DIAGNOSIS — I10 Essential (primary) hypertension: Secondary | ICD-10-CM | POA: Diagnosis not present

## 2014-04-24 LAB — CBC WITH DIFFERENTIAL/PLATELET
BASOS ABS: 0 10*3/uL (ref 0.0–0.1)
Basophils Relative: 0.4 % (ref 0.0–3.0)
EOS ABS: 0 10*3/uL (ref 0.0–0.7)
Eosinophils Relative: 0.8 % (ref 0.0–5.0)
HEMATOCRIT: 42.8 % (ref 36.0–46.0)
Hemoglobin: 14.7 g/dL (ref 12.0–15.0)
LYMPHS ABS: 1.9 10*3/uL (ref 0.7–4.0)
Lymphocytes Relative: 28.6 % (ref 12.0–46.0)
MCHC: 34.3 g/dL (ref 30.0–36.0)
MCV: 87.2 fl (ref 78.0–100.0)
Monocytes Absolute: 0.7 10*3/uL (ref 0.1–1.0)
Monocytes Relative: 11.1 % (ref 3.0–12.0)
Neutro Abs: 3.8 10*3/uL (ref 1.4–7.7)
Neutrophils Relative %: 59.1 % (ref 43.0–77.0)
PLATELETS: 256 10*3/uL (ref 150.0–400.0)
RBC: 4.91 Mil/uL (ref 3.87–5.11)
RDW: 14.1 % (ref 11.5–15.5)
WBC: 6.5 10*3/uL (ref 4.0–10.5)

## 2014-04-24 LAB — HEPATIC FUNCTION PANEL
ALT: 11 U/L (ref 0–35)
AST: 14 U/L (ref 0–37)
Albumin: 4.2 g/dL (ref 3.5–5.2)
Alkaline Phosphatase: 67 U/L (ref 39–117)
Bilirubin, Direct: 0.1 mg/dL (ref 0.0–0.3)
Total Bilirubin: 0.5 mg/dL (ref 0.2–1.2)
Total Protein: 7.9 g/dL (ref 6.0–8.3)

## 2014-04-24 LAB — BASIC METABOLIC PANEL
BUN: 12 mg/dL (ref 6–23)
CO2: 31 meq/L (ref 19–32)
Calcium: 10 mg/dL (ref 8.4–10.5)
Chloride: 98 mEq/L (ref 96–112)
Creatinine, Ser: 1.16 mg/dL (ref 0.40–1.20)
GFR: 60.97 mL/min (ref >60.00)
GLUCOSE: 118 mg/dL — AB (ref 70–99)
POTASSIUM: 3.7 meq/L (ref 3.5–5.1)
Sodium: 135 mEq/L (ref 135–145)

## 2014-04-24 LAB — URINALYSIS, ROUTINE W REFLEX MICROSCOPIC
Bilirubin Urine: NEGATIVE
HGB URINE DIPSTICK: NEGATIVE
KETONES UR: NEGATIVE
LEUKOCYTES UA: NEGATIVE
NITRITE: NEGATIVE
Specific Gravity, Urine: 1.015 (ref 1.000–1.030)
Total Protein, Urine: NEGATIVE
Urine Glucose: NEGATIVE
Urobilinogen, UA: 0.2 (ref 0.0–1.0)
pH: 6 (ref 5.0–8.0)

## 2014-04-24 LAB — LIPID PANEL
CHOL/HDL RATIO: 3
CHOLESTEROL: 201 mg/dL — AB (ref 0–200)
HDL: 59.2 mg/dL (ref >39.00)
LDL CALC: 117 mg/dL — AB (ref 0–99)
NonHDL: 141.8
Triglycerides: 123 mg/dL (ref 0.0–149.0)
VLDL: 24.6 mg/dL (ref 0.0–40.0)

## 2014-04-24 LAB — HEMOGLOBIN A1C: Hgb A1c MFr Bld: 5.8 % (ref 4.6–6.5)

## 2014-04-24 LAB — TSH: TSH: 2.3 u[IU]/mL (ref 0.35–4.50)

## 2014-04-24 MED ORDER — LOVASTATIN 40 MG PO TABS: 40.0000 mg | ORAL_TABLET | Freq: Every day | ORAL | Status: DC

## 2014-04-24 MED ORDER — LISINOPRIL-HYDROCHLOROTHIAZIDE 20-12.5 MG PO TABS: 2.0000 | ORAL_TABLET | Freq: Every day | ORAL | Status: DC

## 2014-04-24 NOTE — Progress Notes (Signed)
Subjective:    Patient ID: [name] [name], female    DOB: 11/17/1950, 64 y.o.   MRN: [id number]  HPI  Here for yearly f/u;  Overall doing ok;  Pt denies CP, worsening SOB, DOE, wheezing, orthopnea, PND, worsening LE edema, palpitations, dizziness or syncope.  Pt denies neurological change such as new headache, facial or extremity weakness.  Pt denies polydipsia, polyuria, or low sugar symptoms. Pt states overall good compliance with treatment and medications, good tolerability, and has been trying to follow lower cholesterol diet.  Pt denies worsening depressive symptoms, suicidal ideation or panic. No fever, night sweats, wt loss, loss of appetite, or other constitutional symptoms.  Pt states good ability with ADL's, has low fall risk, home safety reviewed and adequate, no other significant changes in hearing or vision, and only occasionally active with exercise.  Cont's tpo see [facility] for psychiatric needs. Has been out of [facility] for 1 mo. No new complaints Past Medical History  Diagnosis Date  . CTS (carpal tunnel syndrome)     bilateral  . GLUCOSE INTOLERANCE 06/25/2008  . HYPERLIPIDEMIA 06/25/2008  . SCHIZOPHRENIA 06/25/2008  . HYPERTENSION 06/25/2008  . FATIGUE 08/27/2008  . Impaired glucose tolerance 06/12/2010   Past Surgical History  Procedure Laterality Date  . S/p cervical procedure  1995    LEEP  . Dilation and curettage of uterus  1999    hx of after miscarriage    reports that she has quit smoking. She does not have any smokeless tobacco history on file. She reports that she does not drink alcohol. Her drug history is not on file. family history includes Hypertension in her other; Stroke in her other. Allergies  Allergen Reactions  . Nabumetone     REACTION: rash   Current Outpatient Prescriptions on File Prior to Visit  Medication Sig Dispense Refill  . aspirin 81 MG EC tablet Take 81 mg by mouth daily.      . benztropine (COGENTIN) 2 MG tablet Take 1 tablet (2 mg total) by  mouth daily. 90 tablet 3   No current facility-administered medications on file prior to visit.     Review of Systems Constitutional: Negative for increased diaphoresis, other activity, appetite or other siginficant weight change  HENT: Negative for worsening hearing loss, ear pain, facial swelling, mouth sores and neck stiffness.   Eyes: Negative for other worsening pain, redness or visual disturbance.  Respiratory: Negative for shortness of breath and wheezing.   Cardiovascular: Negative for chest pain and palpitations.  Gastrointestinal: Negative for diarrhea, blood in stool, abdominal distention or other pain Genitourinary: Negative for hematuria, flank pain or change in urine volume.  Musculoskeletal: Negative for myalgias or other joint complaints.  Skin: Negative for color change and wound.  Neurological: Negative for syncope and numbness. other than noted Hematological: Negative for adenopathy. or other swelling Psychiatric/Behavioral: Negative for hallucinations, self-injury, decreased concentration or other worsening agitation.      Objective:   Physical Exam BP 118/76 mmHg  Pulse 79  Temp(Src) 97.7 F (36.5 C) (Oral)  Ht 5\' 8"  (1.727 m)  Wt 175 lb (79.379 kg)  BMI 26.61 kg/m2  SpO2 95% VS noted,  Constitutional: Pt is oriented to person, place, and time. Appears well-developed and well-nourished.  Head: Normocephalic and atraumatic.  Right Ear: External ear normal.  Left Ear: External ear normal.  Nose: Nose normal.  Mouth/Throat: Oropharynx is clear and moist.  Eyes: Conjunctivae and EOM are normal. Pupils are equal, round, and reactive  to light.  Neck: Normal range of motion. Neck supple. No JVD present. No tracheal deviation present.  Cardiovascular: Normal rate, regular rhythm, normal heart sounds and intact distal pulses.   Pulmonary/Chest: Effort normal and breath sounds without rales or wheezing  Abdominal: Soft. Bowel sounds are normal. NT. No HSM    Musculoskeletal: Normal range of motion. Exhibits no edema.  Lymphadenopathy:  Has no cervical adenopathy.  Neurological: Pt is alert and oriented to person, place, and time. Pt has normal reflexes. No cranial nerve deficit. Motor grossly intact Skin: Skin is warm and dry. No rash noted.  Psychiatric:  Has normal mood and affect. Behavior is normal.      Assessment & Plan:

## 2014-04-24 NOTE — Assessment & Plan Note (Signed)
stable overall by history and exam, recent data reviewed with pt, and pt to continue medical treatment as before,  to f/u any worsening symptoms or concerns Lab Results  Component Value Date   LDLCALC 105* 02/12/2013

## 2014-04-24 NOTE — Progress Notes (Signed)
Pre visit review using our clinic review tool, if applicable. No additional management support is needed unless otherwise documented below in the visit note. 

## 2014-04-24 NOTE — Patient Instructions (Addendum)
Please continue all other medications as before, and refills have been done if requested.  Please have the pharmacy call with any other refills you may need.  Please continue your efforts at being more active, low cholesterol diet, and weight control.  You are otherwise up to date with prevention measures today.  Please keep your appointments with your specialists as you may have planned  Please remember to followup with your GYN for the yearly pap smear and/or mammogram (at [facility])  Please go to the LAB in the Basement (turn left off the elevator) for the tests to be done today  You will be contacted by phone if any changes need to be made immediately.  Otherwise, you will receive a letter about your results with an explanation, but please check with MyChart first.  Please remember to sign up for MyChart if you have not done so, as this will be important to you in the future with finding out test results, communicating by private email, and scheduling acute appointments online when needed.  Please return in 1 year for your yearly visit, or sooner if needed

## 2014-04-24 NOTE — Assessment & Plan Note (Signed)
stable overall by history and exam, recent data reviewed with pt, and pt to continue medical treatment as before,  to f/u any worsening symptoms or concerns BP Readings from Last 3 Encounters:  04/24/14 118/76  02/12/13 114/80  10/11/11 122/80

## 2014-04-24 NOTE — Assessment & Plan Note (Signed)
stable overall by history and exam, recent data reviewed with pt, and pt to continue medical treatment as before,  to f/u any worsening symptoms or concerns Lab Results  Component Value Date   HGBA1C 5.8 02/12/2013

## 2014-07-07 DIAGNOSIS — F209 Schizophrenia, unspecified: Secondary | ICD-10-CM | POA: Diagnosis not present

## 2014-10-15 DIAGNOSIS — F209 Schizophrenia, unspecified: Secondary | ICD-10-CM | POA: Diagnosis not present

## 2014-12-04 ENCOUNTER — Ambulatory Visit (INDEPENDENT_AMBULATORY_CARE_PROVIDER_SITE_OTHER): Payer: Medicare Other

## 2014-12-04 DIAGNOSIS — Z23 Encounter for immunization: Secondary | ICD-10-CM

## 2015-01-01 DIAGNOSIS — F209 Schizophrenia, unspecified: Secondary | ICD-10-CM | POA: Diagnosis not present

## 2015-03-26 DIAGNOSIS — F209 Schizophrenia, unspecified: Secondary | ICD-10-CM | POA: Diagnosis not present

## 2015-04-27 ENCOUNTER — Encounter: Payer: Self-pay | Admitting: Internal Medicine

## 2015-04-27 ENCOUNTER — Other Ambulatory Visit (INDEPENDENT_AMBULATORY_CARE_PROVIDER_SITE_OTHER): Payer: Medicare Other

## 2015-04-27 ENCOUNTER — Ambulatory Visit (INDEPENDENT_AMBULATORY_CARE_PROVIDER_SITE_OTHER): Payer: Medicare Other | Admitting: Internal Medicine

## 2015-04-27 VITALS — BP 112/68 | HR 68 | Temp 97.9°F | Resp 20 | Ht 68.0 in | Wt 174.0 lb

## 2015-04-27 DIAGNOSIS — Z1159 Encounter for screening for other viral diseases: Secondary | ICD-10-CM | POA: Diagnosis not present

## 2015-04-27 DIAGNOSIS — Z0000 Encounter for general adult medical examination without abnormal findings: Secondary | ICD-10-CM

## 2015-04-27 DIAGNOSIS — E785 Hyperlipidemia, unspecified: Secondary | ICD-10-CM | POA: Diagnosis not present

## 2015-04-27 DIAGNOSIS — I10 Essential (primary) hypertension: Secondary | ICD-10-CM

## 2015-04-27 DIAGNOSIS — R7302 Impaired glucose tolerance (oral): Secondary | ICD-10-CM | POA: Diagnosis not present

## 2015-04-27 LAB — BASIC METABOLIC PANEL
BUN: 12 mg/dL (ref 6–23)
CO2: 29 mEq/L (ref 19–32)
Calcium: 10.5 mg/dL (ref 8.4–10.5)
Chloride: 99 mEq/L (ref 96–112)
Creatinine, Ser: 1.09 mg/dL (ref 0.40–1.20)
GFR: 65.3 mL/min (ref >60.00)
GLUCOSE: 103 mg/dL — AB (ref 70–99)
POTASSIUM: 3.8 meq/L (ref 3.5–5.1)
SODIUM: 139 meq/L (ref 135–145)

## 2015-04-27 LAB — URINALYSIS, ROUTINE W REFLEX MICROSCOPIC
Bilirubin Urine: NEGATIVE
Hgb urine dipstick: NEGATIVE
Ketones, ur: NEGATIVE
Leukocytes, UA: NEGATIVE
Nitrite: NEGATIVE
PH: 6 (ref 5.0–8.0)
RBC / HPF: NONE SEEN (ref 0–?)
Specific Gravity, Urine: <=1.005 — AB (ref 1.000–1.030)
TOTAL PROTEIN, URINE-UPE24: NEGATIVE
Urine Glucose: NEGATIVE
Urobilinogen, UA: 0.2 (ref 0.0–1.0)
WBC, UA: NONE SEEN (ref 0–?)

## 2015-04-27 LAB — HEPATIC FUNCTION PANEL
ALBUMIN: 4.3 g/dL (ref 3.5–5.2)
ALK PHOS: 61 U/L (ref 39–117)
ALT: 18 U/L (ref 0–35)
AST: 19 U/L (ref 0–37)
BILIRUBIN TOTAL: 0.6 mg/dL (ref 0.2–1.2)
Bilirubin, Direct: 0.1 mg/dL (ref 0.0–0.3)
Total Protein: 8.2 g/dL (ref 6.0–8.3)

## 2015-04-27 LAB — CBC WITH DIFFERENTIAL/PLATELET
BASOS PCT: 0.4 % (ref 0.0–3.0)
Basophils Absolute: 0 10*3/uL (ref 0.0–0.1)
EOS PCT: 1.1 % (ref 0.0–5.0)
Eosinophils Absolute: 0.1 10*3/uL (ref 0.0–0.7)
HCT: 44.7 % (ref 36.0–46.0)
Hemoglobin: 14.9 g/dL (ref 12.0–15.0)
LYMPHS ABS: 1.6 10*3/uL (ref 0.7–4.0)
Lymphocytes Relative: 20.6 % (ref 12.0–46.0)
MCHC: 33.4 g/dL (ref 30.0–36.0)
MCV: 89.4 fl (ref 78.0–100.0)
MONO ABS: 0.7 10*3/uL (ref 0.1–1.0)
Monocytes Relative: 8.7 % (ref 3.0–12.0)
NEUTROS ABS: 5.5 10*3/uL (ref 1.4–7.7)
NEUTROS PCT: 69.2 % (ref 43.0–77.0)
PLATELETS: 267 10*3/uL (ref 150.0–400.0)
RBC: 5 Mil/uL (ref 3.87–5.11)
RDW: 14.7 % (ref 11.5–15.5)
WBC: 7.9 10*3/uL (ref 4.0–10.5)

## 2015-04-27 LAB — LIPID PANEL
CHOLESTEROL: 189 mg/dL (ref 0–200)
HDL: 65.8 mg/dL (ref >39.00)
LDL CALC: 100 mg/dL — AB (ref 0–99)
NonHDL: 123.12
Total CHOL/HDL Ratio: 3
Triglycerides: 117 mg/dL (ref 0.0–149.0)
VLDL: 23.4 mg/dL (ref 0.0–40.0)

## 2015-04-27 LAB — TSH: TSH: 2.53 u[IU]/mL (ref 0.35–4.50)

## 2015-04-27 LAB — HEMOGLOBIN A1C: HEMOGLOBIN A1C: 5.9 % (ref 4.6–6.5)

## 2015-04-27 NOTE — Assessment & Plan Note (Signed)
stable overall by history and exam, recent data reviewed with pt, and pt to continue medical treatment as before,  to f/u any worsening symptoms or concerns Lab Results  Component Value Date   HGBA1C 5.8 04/24/2014    

## 2015-04-27 NOTE — Assessment & Plan Note (Signed)
stable overall by history and exam, recent data reviewed with pt, and pt to continue medical treatment as before,  to f/u any worsening symptoms or concerns Lab Results  Component Value Date   LDLCALC 117* 04/24/2014

## 2015-04-27 NOTE — Patient Instructions (Addendum)
Please continue all other medications as before, and refills have been done if requested.  Please have the pharmacy call with any other refills you may need.  Please continue your efforts at being more active, low cholesterol diet, and weight control.  You are otherwise up to date with prevention measures today.  Please keep your appointments with your specialists as you may have planned  Please go to the LAB in the Basement (turn left off the elevator) for the tests to be done today  You will be contacted by phone if any changes need to be made immediately.  Otherwise, you will receive a letter about your results with an explanation, but please check with MyChart first.  Please remember to sign up for MyChart if you have not done so, as this will be important to you in the future with finding out test results, communicating by private email, and scheduling acute appointments online when needed.  Please return in 6 months, or sooner if needed   Ms. Tuzzolino , Thank you for taking time to come for your Medicare Wellness Visit. I appreciate your ongoing commitment to your health goals. Please review the following plan we discussed and let me know if I can assist you in the future.   To have eye exam this year  Declined Hepatitis c   These are the goals we discussed: Goals    . patient     To stay healthy; taking your meds; eating right and getting as much exercise as she can       This is a list of the screening recommended for you and due dates:  Health Maintenance  Topic Date Due  .  Hepatitis C: One time screening is recommended by Center for Disease Control  (CDC) for  adults born from [birth year] through 1963.   Mar 23, 1950  . HIV Screening  10/11/1967  . Pap Smear  10/10/1973  . Mammogram  04/29/2012  . Shingles Vaccine  10/10/2012  . Flu Shot  09/21/2015  . Colon Cancer Screening  06/26/2018  . Tetanus Vaccine  02/13/2023

## 2015-04-27 NOTE — Progress Notes (Signed)
Pre visit review using our clinic review tool, if applicable. No additional management support is needed unless otherwise documented below in the visit note. 

## 2015-04-27 NOTE — Progress Notes (Signed)
Subjective:    Patient ID: [name] [name], female    DOB: [birth year], 65 y.o.   MRN: [id number]  HPI  Here for yearly f/u;  Overall doing ok;  Pt denies Chest pain, worsening SOB, DOE, wheezing, orthopnea, PND, worsening LE edema, palpitations, dizziness or syncope.  Pt denies neurological change such as new headache, facial or extremity weakness.  Pt denies polydipsia, polyuria, or low sugar symptoms. Pt states overall good compliance with treatment and medications, good tolerability, and has been trying to follow appropriate diet.  Pt denies worsening depressive symptoms, suicidal ideation or panic. No fever, night sweats, wt loss, loss of appetite, or other constitutional symptoms.  Pt states good ability with ADL's, has low fall risk, home safety reviewed and adequate, no other significant changes in hearing or vision, and only occasionally active with exercise. Declines mammogram  States has been out of lipitor for 1 wk, but willing to take, tolerates ok Wt Readings from Last 3 Encounters:  04/27/15 174 lb (78.926 kg)  04/24/14 175 lb (79.379 kg)  02/12/13 174 lb 4 oz (79.039 kg)   Past Medical History  Diagnosis Date  . CTS (carpal tunnel syndrome)     bilateral  . GLUCOSE INTOLERANCE 06/25/2008  . HYPERLIPIDEMIA 06/25/2008  . SCHIZOPHRENIA 06/25/2008  . HYPERTENSION 06/25/2008  . FATIGUE 08/27/2008  . Impaired glucose tolerance 06/12/2010   Past Surgical History  Procedure Laterality Date  . S/p cervical procedure  1995    LEEP  . Dilation and curettage of uterus  1999    hx of after miscarriage    reports that she has quit smoking. She does not have any smokeless tobacco history on file. She reports that she does not drink alcohol. Her drug history is not on file. family history includes Hypertension in her other; Stroke in her other. Allergies  Allergen Reactions  . Nabumetone     REACTION: rash   Current Outpatient Prescriptions on File Prior to Visit  Medication Sig Dispense  Refill  . aspirin 81 MG EC tablet Take 81 mg by mouth daily.      . benztropine (COGENTIN) 2 MG tablet Take 1 tablet (2 mg total) by mouth daily. 90 tablet 3  . escitalopram (LEXAPRO) 5 MG tablet Take 5 mg by mouth daily.    . haloperidol (HALDOL) 10 MG tablet Take 15 mg by mouth daily. Take 1 1/2 tab by mouth daily    . lisinopril-hydrochlorothiazide (PRINZIDE,ZESTORETIC) 20-12.5 MG per tablet Take 2 tablets by mouth daily. 60 tablet 11  . lovastatin (MEVACOR) 40 MG tablet Take 1 tablet (40 mg total) by mouth daily. 30 tablet 11   No current facility-administered medications on file prior to visit.   Review of Systems Constitutional: Negative for increased diaphoresis, other activity, appetite or siginficant weight change other than noted HENT: Negative for worsening hearing loss, ear pain, facial swelling, mouth sores and neck stiffness.   Eyes: Negative for other worsening pain, redness or visual disturbance.  Respiratory: Negative for shortness of breath and wheezing  Cardiovascular: Negative for chest pain and palpitations.  Gastrointestinal: Negative for diarrhea, blood in stool, abdominal distention or other pain Genitourinary: Negative for hematuria, flank pain or change in urine volume.  Musculoskeletal: Negative for myalgias or other joint complaints.  Skin: Negative for color change and wound or drainage.  Neurological: Negative for syncope and numbness. other than noted Hematological: Negative for adenopathy. or other swelling Psychiatric/Behavioral: Negative for hallucinations, SI, self-injury, decreased concentration or other worsening  agitation.      Objective:   Physical Exam BP 112/68 mmHg  Pulse 68  Temp(Src) 97.9 F (36.6 C) (Oral)  Resp 20  Wt 174 lb (78.926 kg)  SpO2 97% VS noted,  Constitutional: Pt is oriented to person, place, and time. Appears well-developed and well-nourished, in no significant distress Head: Normocephalic and atraumatic.  Right Ear:  External ear normal.  Left Ear: External ear normal.  Nose: Nose normal.  Mouth/Throat: Oropharynx is clear and moist.  Eyes: Conjunctivae and EOM are normal. Pupils are equal, round, and reactive to light.  Neck: Normal range of motion. Neck supple. No JVD present. No tracheal deviation present or significant neck LA or mass Cardiovascular: Normal rate, regular rhythm, normal heart sounds and intact distal pulses.   Pulmonary/Chest: Effort normal and breath sounds without rales or wheezing  Abdominal: Soft. Bowel sounds are normal. NT. No HSM  Musculoskeletal: Normal range of motion. Exhibits no edema.  Lymphadenopathy:  Has no cervical adenopathy.  Neurological: Pt is alert and oriented to person, place, and time. Pt has normal reflexes. No cranial nerve deficit. Motor grossly intact Skin: Skin is warm and dry. No rash noted.  Psychiatric:  Has normal mood and affect. Behavior is normal.     Assessment & Plan:

## 2015-04-27 NOTE — Assessment & Plan Note (Signed)
stable overall by history and exam, recent data reviewed with pt, and pt to continue medical treatment as before,  to f/u any worsening symptoms or concerns BP Readings from Last 3 Encounters:  04/27/15 112/68  04/24/14 118/76  02/12/13 114/80

## 2015-04-27 NOTE — Progress Notes (Signed)
Subjective:   [name] [name] is a 65 y.o. female who presents for an Initial Medicare Annual Wellness Visit.  Review of Systems    HRA assessment completed during visit; [name], [name] The Patient was informed that this wellness visit is to identify risk and educate on how to reduce risk for increase disease through lifestyle changes.   ROS deferred to CPE exam with physician  Medical and family hx Family dad had a stroke  Medical issues  HTN medical managed  Hyperlipidemia chol ratio is 3    BMI: 26 Diet; does not eat a lot of fried food Tries to eat a lot of foods with fiber Uses sweetner; not sugar Breakfast; sometimes; cereal; bacon eggs and biscuit Lunch: sandwich and soup Dinner; meat and vegetable; starch Snacks on crackers; piece of fruit   Exercise; housekeeping;  States she will try to start exercising; will walk a couple of times a week    SAFETY; lives with spouse;  Safety reviewed for the home; one level home No falls;  Bathroom safety; both shower and bath;  Warehouse manager; Yes Smoke detectors yes Firearms safety / to keep in a safe place if they exist Driving accidents and seatbelt/ no accidents Sun protection/ no;  Stressors; no stressors   Medication review/ no   Fall assessment no  Mobilization and Functional losses in the last year./ no  Sleep patterns sleeps ok, no issues    Urinary or fecal incontinence reviewed/ no problems with voiding or bowels    Counseling: Hep c; born between [birth year] and [birth year];  Understands she could have liver cancer; or have contacted the virus by vaccination or other; declines hepatitis c at this time/ Verbalizes understanding for risk and can have drawn at another time if she so chooses.  Given a copy of risk for Hep C  HIV / declines Colonoscopy; 06/2008 -declined [name] [name] consdier colo guard in the future  EKG: 09/2011 Mammogram March 2012 PAP does not have done/ declines for now Hearing: sufficient / 4000hz  in  both ears Ophthalmology exam; will schedule soon ; states she knows she needs to go  Immunizations / declines shingles  Health advice or referrals To have eye exam soon.   Current Care Team reviewed and updated  Cardiac Risk Factors include: dyslipidemia;sedentary lifestyle     Objective:    Today's Vitals   04/27/15 0846  BP: 112/68  Pulse: 68  Temp: 97.9 F (36.6 C)  TempSrc: Oral  Resp: 20  Height: 5\' 8"  (1.727 m)  Weight: 174 lb (78.926 kg)  SpO2: 97%    Current Medications (verified) Outpatient Encounter Prescriptions as of 04/27/2015  Medication Sig  . aspirin 81 MG EC tablet Take 81 mg by mouth daily.    . benztropine (COGENTIN) 2 MG tablet Take 1 tablet (2 mg total) by mouth daily.  Marland Kitchen escitalopram (LEXAPRO) 5 MG tablet Take 5 mg by mouth daily.  . haloperidol (HALDOL) 10 MG tablet Take 15 mg by mouth daily. Take 1 1/2 tab by mouth daily  . lisinopril-hydrochlorothiazide (PRINZIDE,ZESTORETIC) 20-12.5 MG per tablet Take 2 tablets by mouth daily.  Marland Kitchen lovastatin (MEVACOR) 40 MG tablet Take 1 tablet (40 mg total) by mouth daily.   No facility-administered encounter medications on file as of 04/27/2015.    Allergies (verified) Nabumetone   History: Past Medical History  Diagnosis Date  . CTS (carpal tunnel syndrome)     bilateral  . GLUCOSE INTOLERANCE 06/25/2008  . HYPERLIPIDEMIA 06/25/2008  . SCHIZOPHRENIA  06/25/2008  . HYPERTENSION 06/25/2008  . FATIGUE 08/27/2008  . Impaired glucose tolerance 06/12/2010   Past Surgical History  Procedure Laterality Date  . S/p cervical procedure  1995    LEEP  . Dilation and curettage of uterus  1999    hx of after miscarriage   Family History  Problem Relation Age of Onset  . Hypertension Other   . Stroke Other    Social History   Occupational History  . disabled psych    Social History Main Topics  . Smoking status: Former Research scientist (life sciences)  . Smokeless tobacco: Former Systems developer    Quit date: 02/21/1976     Comment: quit a long  time ago;   . Alcohol Use: No  . Drug Use: Not on file  . Sexual Activity: Not on file    Tobacco Counseling Counseling given: Not Answered   Activities of Daily Living In your present state of health, do you have any difficulty performing the following activities: 04/27/2015  Hearing? N  Vision? (No Data)  Difficulty concentrating or making decisions? N  Walking or climbing stairs? N  Dressing or bathing? N  Doing errands, shopping? N  Preparing Food and eating ? N  Using the Toilet? N  In the past six months, have you accidently leaked urine? N  Do you have problems with loss of bowel control? N  Managing your Medications? N  Managing your Finances? N  Housekeeping or managing your Housekeeping? N    Immunizations and Health Maintenance Immunization History  Administered Date(s) Administered  . Influenza Split 12/22/2011  . Influenza Whole 02/09/2009  . Influenza,inj,Quad PF,36+ Mos 01/02/2014, 12/04/2014  . Td 02/20/2002  . Tetanus 02/12/2013   Health Maintenance Due  Topic Date Due  . Hepatitis C Screening  1950-07-09  . HIV Screening  10/11/1967  . PAP SMEAR  10/10/1973  . MAMMOGRAM  04/29/2012  . ZOSTAVAX  10/10/2012    Patient Care Team: Biagio Borg, MD as PCP - Kaleen Mask  Indicate any recent Medical Services you may have received from other than [facility] providers in the past year (date may be approximate).     Assessment:   This is a routine wellness examination for [name] [name]. '  Assessment   Patient presents for yearly preventative medicine examination. Medicare questionnaire screening were completed, i.e. Functional; fall risk; depression, memory loss and hearing all unremarkable   All immunizations and health maintenance protocols were reviewed with the patient and needed orders were placed. Declines the shingles;  Declines hepatitis c but undertands risk of liver cancer if exposed.   Education provided for laboratory screens;  Going to have lab work  drawn  Medication reconciliation, past medical history, social history, problem list and allergies were reviewed in detail with the patient  Goals were established with regard to weight loss and exercise,  Declines Advanced Directive information at this time  Hearing/Vision screen  Hearing Screening   125Hz  250Hz  500Hz  1000Hz  2000Hz  4000Hz  8000Hz   Right ear:      100   Left ear:      100     4000hz  both ears   Dietary issues and exercise activities discussed: Goal is to watch the sweets; to exercise more Current Exercise Habits: Home exercise routine, Type of exercise: walking, Time (Minutes): 30, Frequency (Times/Week): 2, Weekly Exercise (Minutes/Week): 60, Intensity: Moderate  Goals    . patient     To stay healthy; taking your meds; eating right and getting as much exercise as she  can      Depression Screen PHQ 2/9 Scores 04/27/2015 04/27/2015 04/24/2014 02/12/2013  PHQ - 2 Score 0 0 1 0    Denied mood issues; states she is doing well for now Fall Risk Fall Risk  04/27/2015 04/27/2015 04/24/2014 02/12/2013  Falls in the past year? No No No No    Cognitive Function: No flowsheet data found.   AD8 Score 0 / states she has no memory issues;   Screening Tests Health Maintenance  Topic Date Due  . Hepatitis C Screening  Apr 18, 1950  . HIV Screening  10/11/1967  . PAP SMEAR  10/10/1973  . MAMMOGRAM  04/29/2012  . ZOSTAVAX  10/10/2012  . INFLUENZA VACCINE  09/21/2015  . COLONOSCOPY  06/26/2018  . TETANUS/TDAP  02/13/2023      Plan:   Will have an eye exam soon  May consider colo guard in the future/ colonoscopy was declined   Declines Hep c today  Declines shingles but was educated   During the course of the visit, Rhyann was educated and counseled about the following appropriate screening and preventive services:   Vaccines to include/Pneumoccal, Influenza, Hepatitis B, Td, Zostavax, HCV  Electrocardiogram/ 09/2011  Cardiovascular disease screening/ BP good BMI  good  Colorectal cancer screening- declined; may consider colo guard in the future  Bone density screening/ <65  Diabetes screening/ 5.8 to be checked again today  Glaucoma screening/ recommended eye exam  Mammography/PAP/ declines  Nutrition counseling/ wil cut back on sweets   Smoking cessation counseling  Patient Instructions (the written plan) were given to the patient.    [id number], RN   04/27/2015   Medical screening examination/treatment/procedure(s) were performed by non-physician practitioner and as supervising physician I was immediately available for consultation/collaboration. I agree with above. Cathlean Cower, MD

## 2015-04-30 ENCOUNTER — Other Ambulatory Visit: Payer: Self-pay | Admitting: Internal Medicine

## 2015-07-05 ENCOUNTER — Other Ambulatory Visit: Payer: Self-pay | Admitting: Internal Medicine

## 2015-07-27 DIAGNOSIS — F209 Schizophrenia, unspecified: Secondary | ICD-10-CM | POA: Diagnosis not present

## 2015-10-20 DIAGNOSIS — F209 Schizophrenia, unspecified: Secondary | ICD-10-CM | POA: Diagnosis not present

## 2015-12-28 ENCOUNTER — Ambulatory Visit (INDEPENDENT_AMBULATORY_CARE_PROVIDER_SITE_OTHER): Payer: Medicare Other

## 2015-12-28 DIAGNOSIS — Z23 Encounter for immunization: Secondary | ICD-10-CM | POA: Diagnosis not present

## 2016-01-28 ENCOUNTER — Other Ambulatory Visit: Payer: Self-pay | Admitting: Internal Medicine

## 2016-01-28 DIAGNOSIS — F209 Schizophrenia, unspecified: Secondary | ICD-10-CM | POA: Diagnosis not present

## 2016-04-07 DIAGNOSIS — F209 Schizophrenia, unspecified: Secondary | ICD-10-CM | POA: Diagnosis not present

## 2016-04-25 ENCOUNTER — Ambulatory Visit: Payer: Medicare Other | Admitting: Internal Medicine

## 2016-04-25 ENCOUNTER — Telehealth: Payer: Self-pay | Admitting: Internal Medicine

## 2016-04-25 NOTE — Telephone Encounter (Signed)
Attempted to call patient regarding awv. Patient's phone was busy. Will attempt to call patient again.

## 2016-07-20 ENCOUNTER — Other Ambulatory Visit: Payer: Self-pay | Admitting: Internal Medicine

## 2016-07-27 ENCOUNTER — Ambulatory Visit: Payer: Medicare Other | Admitting: Internal Medicine

## 2016-09-07 ENCOUNTER — Encounter: Payer: Medicare Other | Admitting: Internal Medicine

## 2016-09-26 ENCOUNTER — Other Ambulatory Visit: Payer: Self-pay | Admitting: Internal Medicine

## 2016-09-26 ENCOUNTER — Encounter: Payer: Self-pay | Admitting: Internal Medicine

## 2016-09-26 ENCOUNTER — Other Ambulatory Visit (INDEPENDENT_AMBULATORY_CARE_PROVIDER_SITE_OTHER): Payer: Medicare Other

## 2016-09-26 ENCOUNTER — Ambulatory Visit (INDEPENDENT_AMBULATORY_CARE_PROVIDER_SITE_OTHER): Payer: Medicare Other | Admitting: Internal Medicine

## 2016-09-26 VITALS — BP 110/68 | HR 97 | Ht 68.0 in | Wt 174.0 lb

## 2016-09-26 DIAGNOSIS — Z1159 Encounter for screening for other viral diseases: Secondary | ICD-10-CM

## 2016-09-26 DIAGNOSIS — R7302 Impaired glucose tolerance (oral): Secondary | ICD-10-CM

## 2016-09-26 DIAGNOSIS — I10 Essential (primary) hypertension: Secondary | ICD-10-CM | POA: Diagnosis not present

## 2016-09-26 DIAGNOSIS — E785 Hyperlipidemia, unspecified: Secondary | ICD-10-CM | POA: Diagnosis not present

## 2016-09-26 DIAGNOSIS — Z114 Encounter for screening for human immunodeficiency virus [HIV]: Secondary | ICD-10-CM

## 2016-09-26 DIAGNOSIS — Z1231 Encounter for screening mammogram for malignant neoplasm of breast: Secondary | ICD-10-CM | POA: Diagnosis not present

## 2016-09-26 DIAGNOSIS — Z129 Encounter for screening for malignant neoplasm, site unspecified: Secondary | ICD-10-CM

## 2016-09-26 LAB — CBC WITH DIFFERENTIAL/PLATELET
BASOS ABS: 0 10*3/uL (ref 0.0–0.1)
Basophils Relative: 0.5 % (ref 0.0–3.0)
EOS PCT: 1 % (ref 0.0–5.0)
Eosinophils Absolute: 0.1 10*3/uL (ref 0.0–0.7)
HCT: 42.1 % (ref 36.0–46.0)
HEMOGLOBIN: 14.1 g/dL (ref 12.0–15.0)
LYMPHS PCT: 26.6 % (ref 12.0–46.0)
Lymphs Abs: 1.6 10*3/uL (ref 0.7–4.0)
MCHC: 33.5 g/dL (ref 30.0–36.0)
MCV: 91.2 fl (ref 78.0–100.0)
MONOS PCT: 10.1 % (ref 3.0–12.0)
Monocytes Absolute: 0.6 10*3/uL (ref 0.1–1.0)
Neutro Abs: 3.8 10*3/uL (ref 1.4–7.7)
Neutrophils Relative %: 61.8 % (ref 43.0–77.0)
Platelets: 299 10*3/uL (ref 150.0–400.0)
RBC: 4.62 Mil/uL (ref 3.87–5.11)
RDW: 15.1 % (ref 11.5–15.5)
WBC: 6.1 10*3/uL (ref 4.0–10.5)

## 2016-09-26 LAB — HEPATIC FUNCTION PANEL
ALBUMIN: 4.2 g/dL (ref 3.5–5.2)
ALT: 11 U/L (ref 0–35)
AST: 14 U/L (ref 0–37)
Alkaline Phosphatase: 56 U/L (ref 39–117)
Bilirubin, Direct: 0.1 mg/dL (ref 0.0–0.3)
Total Bilirubin: 0.6 mg/dL (ref 0.2–1.2)
Total Protein: 8.4 g/dL — ABNORMAL HIGH (ref 6.0–8.3)

## 2016-09-26 LAB — BASIC METABOLIC PANEL
BUN: 10 mg/dL (ref 6–23)
CALCIUM: 10.1 mg/dL (ref 8.4–10.5)
CO2: 32 mEq/L (ref 19–32)
CREATININE: 1.03 mg/dL (ref 0.40–1.20)
Chloride: 99 mEq/L (ref 96–112)
GFR: 69.39 mL/min (ref >60.00)
GLUCOSE: 105 mg/dL — AB (ref 70–99)
Potassium: 3.4 mEq/L — ABNORMAL LOW (ref 3.5–5.1)
SODIUM: 138 meq/L (ref 135–145)

## 2016-09-26 LAB — LIPID PANEL
CHOL/HDL RATIO: 3
Cholesterol: 160 mg/dL (ref 0–200)
HDL: 51.7 mg/dL (ref >39.00)
LDL CALC: 76 mg/dL (ref 0–99)
NONHDL: 108.16
Triglycerides: 162 mg/dL — ABNORMAL HIGH (ref 0.0–149.0)
VLDL: 32.4 mg/dL (ref 0.0–40.0)

## 2016-09-26 LAB — TSH: TSH: 3.91 u[IU]/mL (ref 0.35–4.50)

## 2016-09-26 MED ORDER — LOVASTATIN 40 MG PO TABS: 40.0000 mg | ORAL_TABLET | Freq: Every day | ORAL | 3 refills | Status: DC

## 2016-09-26 MED ORDER — LISINOPRIL-HYDROCHLOROTHIAZIDE 20-12.5 MG PO TABS: 2.0000 | ORAL_TABLET | Freq: Every day | ORAL | 3 refills | Status: DC

## 2016-09-26 NOTE — Patient Instructions (Addendum)
Please continue all other medications as before, and refills have been done if requested.  Please have the pharmacy call with any other refills you may need.  Please continue your efforts at being more active, low cholesterol diet, and weight control  Please keep your appointments with your specialists as you may have planned  Please go to the LAB in the Basement (turn left off the elevator) for the tests to be done today  You will be contacted by phone if any changes need to be made immediately.  Otherwise, you will receive a letter about your results with an explanation, but please check with MyChart first.  Please remember to sign up for MyChart if you have not done so, as this will be important to you in the future with finding out test results, communicating by private email, and scheduling acute appointments online when needed.  If you have Medicare related insurance (such as traditional Medicare, [employer] or [employer], or similar), Please make an appointment at the [facility] with Sharee Pimple, the [employer], for your Wellness Visit in this office, which is a benefit with your insurance.  Please return in 6 months, or sooner if needed

## 2016-09-26 NOTE — Progress Notes (Addendum)
   Subjective:    Patient ID: [name] [name], female    DOB: 1950/06/03, 66 y.o.   MRN: [id number]  HPI  Here to f/u; overall doing ok,  Pt denies chest pain, increasing sob or doe, wheezing, orthopnea, PND, increased LE swelling, palpitations, dizziness or syncope.  Pt denies new neurological symptoms such as new headache, or facial or extremity weakness or numbness.  Pt denies polydipsia, polyuria, or low sugar episode.  Pt states overall good compliance with meds, mostly trying to follow appropriate diet, with wt overall stable,  but little exercise however.  Again declines colonoscopy.  OK for [facility] at [facility] Past Medical History:  Diagnosis Date  . CTS (carpal tunnel syndrome)    bilateral  . FATIGUE 08/27/2008  . GLUCOSE INTOLERANCE 06/25/2008  . HYPERLIPIDEMIA 06/25/2008  . HYPERTENSION 06/25/2008  . Impaired glucose tolerance 06/12/2010  . SCHIZOPHRENIA 06/25/2008   Past Surgical History:  Procedure Laterality Date  . DILATION AND CURETTAGE OF UTERUS  1999   hx of after miscarriage  . s/p cervical procedure  1995   LEEP    reports that she has quit smoking. She quit smokeless tobacco use about 40 years ago. She reports that she does not drink alcohol. Her drug history is not on file. family history includes Hypertension in her other; Stroke in her other. Allergies  Allergen Reactions  . Nabumetone     REACTION: rash   Current Outpatient Prescriptions on File Prior to Visit  Medication Sig Dispense Refill  . aspirin 81 MG EC tablet Take 81 mg by mouth daily.      . benztropine (COGENTIN) 2 MG tablet Take 1 tablet (2 mg total) by mouth daily. 90 tablet 3  . escitalopram (LEXAPRO) 5 MG tablet Take 5 mg by mouth daily.    . haloperidol (HALDOL) 10 MG tablet Take 15 mg by mouth daily. Take 1 1/2 tab by mouth daily     No current facility-administered medications on file prior to visit.    Review of Systems  Constitutional: Negative for other unusual diaphoresis or sweats HENT:  Negative for ear discharge or swelling Eyes: Negative for other worsening visual disturbances Respiratory: Negative for stridor or other swelling  Gastrointestinal: Negative for worsening distension or other blood Genitourinary: Negative for retention or other urinary change Musculoskeletal: Negative for other MSK pain or swelling Skin: Negative for color change or other new lesions Neurological: Negative for worsening tremors and other numbness  Psychiatric/Behavioral: Negative for worsening agitation or other fatigue All other system neg per pt    Objective:   Physical Exam BP 110/68   Pulse 97   Ht 5\' 8"  (1.727 m)   Wt 174 lb (78.9 kg)   SpO2 98%   BMI 26.46 kg/m  VS noted,  Constitutional: Pt appears in NAD HENT: Head: NCAT.  Right Ear: External ear normal.  Left Ear: External ear normal.  Eyes: . Pupils are equal, round, and reactive to light. Conjunctivae and EOM are normal Nose: without d/c or deformity Neck: Neck supple. Gross normal ROM Cardiovascular: Normal rate and regular rhythm.   Pulmonary/Chest: Effort normal and breath sounds without rales or wheezing.  Abd:  Soft, NT, ND, + BS, no organomegaly Neurological: Pt is alert. At baseline orientation, motor grossly intact Skin: Skin is warm. No rashes, other new lesions, no LE edema Psychiatric: Pt behavior is normal without agitation  No other exam findings     Assessment & Plan:

## 2016-09-27 ENCOUNTER — Encounter: Payer: Self-pay | Admitting: Internal Medicine

## 2016-09-27 ENCOUNTER — Other Ambulatory Visit: Payer: Self-pay | Admitting: Internal Medicine

## 2016-09-27 ENCOUNTER — Telehealth: Payer: Self-pay

## 2016-09-27 DIAGNOSIS — F209 Schizophrenia, unspecified: Secondary | ICD-10-CM | POA: Diagnosis not present

## 2016-09-27 LAB — URINALYSIS, ROUTINE W REFLEX MICROSCOPIC
BILIRUBIN URINE: NEGATIVE
KETONES UR: NEGATIVE
LEUKOCYTES UA: NEGATIVE
NITRITE: NEGATIVE
Specific Gravity, Urine: 1.015 (ref 1.000–1.030)
UROBILINOGEN UA: 1 (ref 0.0–1.0)
Urine Glucose: NEGATIVE
pH: 6.5 (ref 5.0–8.0)

## 2016-09-27 LAB — HEPATITIS C ANTIBODY: HCV AB: NONREACTIVE

## 2016-09-27 LAB — HIV ANTIBODY (ROUTINE TESTING W REFLEX): HIV 1&2 Ab, 4th Generation: NONREACTIVE

## 2016-09-27 MED ORDER — POTASSIUM CHLORIDE ER 10 MEQ PO TBCR: 10.0000 meq | EXTENDED_RELEASE_TABLET | Freq: Every day | ORAL | 3 refills | Status: DC

## 2016-09-27 NOTE — Telephone Encounter (Signed)
-----   Message from Biagio Borg, MD sent at 09/27/2016  9:57 AM EDT ----- Letter sent, cont same tx except  The test results show that your current treatment is OK, except the potassium is mildly low, most likely due to the diuretic fluid pill that is part the blood pressure pill.  We need to add a daily potassium 10 meq per day.  I will send a new prescription, and you should be called from the office.    Shirron to please inform pt, I will do rx

## 2016-09-27 NOTE — Telephone Encounter (Signed)
Informed pt, she expressed understanding 

## 2016-09-27 NOTE — Progress Notes (Signed)
   Subjective:    Patient ID: [name] [name], female    DOB: 1950/06/16, 66 y.o.   MRN: [id number]  HPI    Review of Systems     Objective:   Physical Exam        Assessment & Plan:

## 2016-09-28 NOTE — Assessment & Plan Note (Signed)
stable overall by history and exam, recent data reviewed with pt, and pt to continue medical treatment as before,  to f/u any worsening symptoms or concerns Lab Results  Component Value Date   HGBA1C 5.9 04/27/2015   

## 2016-09-28 NOTE — Assessment & Plan Note (Signed)
stable overall by history and exam, recent data reviewed with pt, and pt to continue medical treatment as before,  to f/u any worsening symptoms or concerns BP Readings from Last 3 Encounters:  09/26/16 110/68  04/27/15 112/68  04/24/14 118/76

## 2016-09-28 NOTE — Assessment & Plan Note (Signed)
stable overall by history and exam, recent data reviewed with pt, and pt to continue medical treatment as before,  to f/u any worsening symptoms or concerns Lab Results  Component Value Date   LDLCALC 76 09/26/2016

## 2016-10-10 ENCOUNTER — Encounter: Payer: Medicare Other | Admitting: Internal Medicine

## 2016-12-02 ENCOUNTER — Ambulatory Visit (INDEPENDENT_AMBULATORY_CARE_PROVIDER_SITE_OTHER): Payer: Medicare Other

## 2016-12-02 DIAGNOSIS — Z23 Encounter for immunization: Secondary | ICD-10-CM | POA: Diagnosis not present

## 2016-12-27 DIAGNOSIS — F209 Schizophrenia, unspecified: Secondary | ICD-10-CM | POA: Diagnosis not present

## 2017-03-27 DIAGNOSIS — F209 Schizophrenia, unspecified: Secondary | ICD-10-CM | POA: Diagnosis not present

## 2017-04-16 ENCOUNTER — Telehealth: Payer: Self-pay

## 2017-04-16 ENCOUNTER — Ambulatory Visit: Payer: Self-pay | Admitting: *Deleted

## 2017-04-16 MED ORDER — POTASSIUM CHLORIDE ER 10 MEQ PO TBCR: 10.0000 meq | EXTENDED_RELEASE_TABLET | Freq: Every day | ORAL | 3 refills | Status: DC

## 2017-04-16 NOTE — Telephone Encounter (Signed)
Copied from [place]. Topic: Quick Communication - Rx Refill/Question >> Apr 16, 2017  9:00 AM Antonieta Iba C wrote: Medication: potassium chloride (K-DUR) 10 MEQ tablet  -  pt called in requesting a call back to discuss her medication.   CB: phone: [phone]    Has the patient contacted their pharmacy? no      Agent: Please be advised that RX refills may take up to 3 business days. We ask that you follow-up with your pharmacy.

## 2017-04-16 NOTE — Telephone Encounter (Signed)
Pt stating that she has been experiencing right foot swelling with tenderness to the bottom of her foot since this weekend. Pt states the foot feels tight and is non pitting at this time. Pt denies any redness, fever or pain to the foot at this time. Pt initially thought the swelling was related to taking potassium chloride which she started taking again today after being off of the medication for 2 months. Explained to pt that swelling was not a symptom usually associated with taking potassium and she would need to have  further evaluation in the office. Pt advised that she would need to be seen for an office visit in the next 3 days based on her current symptoms. Pt verbalized understanding, but asked to have appt made for next week due to her schedule. Advised pt again that she should be seen sooner than next week, but the pt is asking to make appt for next week. Pt also advised that if swelling becomes worse and other symptoms develop prior to appt on Monday to call the office back. Appt scheduled for 3/4 with Dr. Jenny Reichmann per pt's request.  Reason for Disposition . [1] MILD swelling of both ankles (i.e., pedal edema) AND [2] new onset or worsening  Answer Assessment - Initial Assessment Questions 1. ONSET: "When did the swelling start?" (e.g., minutes, hours, days)     This weekend 2. LOCATION: "What part of the leg is swollen?"  "Are both legs swollen or just one leg?"     Just right foot and not the leg 3. SEVERITY: "How bad is the swelling?" (e.g., localized; mild, moderate, severe)  - Localized - small area of swelling localized to one leg  - MILD pedal edema - swelling limited to foot and ankle, pitting edema < 1/4 inch (6 mm) deep, rest and elevation eliminate most or all swelling  - MODERATE edema - swelling of lower leg to knee, pitting edema > 1/4 inch (6 mm) deep, rest and elevation only partially reduce swelling  - SEVERE edema - swelling extends above knee, facial or hand swelling present       Localized, right foot feels tight, non pitting 4. REDNESS: "Does the swelling look red or infected?"     No 5. PAIN: "Is the swelling painful to touch?" If so, ask: "How painful is it?"   (Scale 1-10; mild, moderate or severe)     Tenderness to the bottom of foot 6. FEVER: "Do you have a fever?" If so, ask: "What is it, how was it measured, and when did it start?"      No 7. CAUSE: "What do you think is causing the leg swelling?"     Taking potassium supplements 8. MEDICAL HISTORY: "Do you have a history of heart failure, kidney disease, liver failure, or cancer?"    HTN 9. RECURRENT SYMPTOM: "Have you had leg swelling before?" If so, ask: "When was the last time?" "What happened that time?"     No 10. OTHER SYMPTOMS: "Do you have any other symptoms?" (e.g., chest pain, difficulty breathing)       No complaints of other symptoms 11. PREGNANCY: "Is there any chance you are pregnant?" "When was your last menstrual period?"       n/a  Protocols used: LEG SWELLING AND EDEMA-A-AH

## 2017-04-17 NOTE — Telephone Encounter (Signed)
Please advise. PEC scheduled an appt for 3/4.

## 2017-04-17 NOTE — Telephone Encounter (Signed)
Ok to see 3/4 unless she needs sooner; I agree not likely K related, but ? Plantar fascitiis  She could also see Dr Raeford Razor for this possibly sooner than 3/4

## 2017-04-17 NOTE — Telephone Encounter (Signed)
Can you please schedule a sooner appt with Dr. Raeford Razor and cancel appt with Dr. Jenny Reichmann. Thanks!

## 2017-04-17 NOTE — Telephone Encounter (Signed)
Patient scheduled 2/28 with Raeford Razor

## 2017-04-19 ENCOUNTER — Ambulatory Visit: Payer: Medicare Other | Admitting: Family Medicine

## 2017-04-23 ENCOUNTER — Ambulatory Visit: Payer: Medicare Other | Admitting: Internal Medicine

## 2017-04-24 ENCOUNTER — Ambulatory Visit: Payer: Medicare Other | Admitting: Family Medicine

## 2017-05-01 ENCOUNTER — Ambulatory Visit (INDEPENDENT_AMBULATORY_CARE_PROVIDER_SITE_OTHER): Payer: Medicare Other | Admitting: Internal Medicine

## 2017-05-01 ENCOUNTER — Encounter: Payer: Self-pay | Admitting: Internal Medicine

## 2017-05-01 ENCOUNTER — Telehealth: Payer: Self-pay | Admitting: Internal Medicine

## 2017-05-01 VITALS — BP 118/84 | HR 106 | Temp 98.4°F | Ht 68.0 in | Wt 170.0 lb

## 2017-05-01 DIAGNOSIS — M109 Gout, unspecified: Secondary | ICD-10-CM | POA: Diagnosis not present

## 2017-05-01 DIAGNOSIS — I10 Essential (primary) hypertension: Secondary | ICD-10-CM | POA: Diagnosis not present

## 2017-05-01 DIAGNOSIS — R7302 Impaired glucose tolerance (oral): Secondary | ICD-10-CM

## 2017-05-01 MED ORDER — COLCHICINE 0.6 MG PO TABS: ORAL_TABLET | ORAL | 1 refills | Status: DC

## 2017-05-01 MED ORDER — INDOMETHACIN 50 MG PO CAPS: 50.0000 mg | ORAL_CAPSULE | Freq: Three times a day (TID) | ORAL | 1 refills | Status: DC | PRN

## 2017-05-01 NOTE — Assessment & Plan Note (Signed)
Presumed by hx now improved, for colchicine prn recurrence.

## 2017-05-01 NOTE — Progress Notes (Signed)
Subjective:    Patient ID: [name] [name], female    DOB: 1950/07/05, 67 y.o.   MRN: [id number]  HPI  Here to f/u with c/o acute onset mild to mod right first MTP red, tender, swelling without trauma or fever for several days recently but now improved.  No prior hx of gout.  Pt denies chest pain, increased sob or doe, wheezing, orthopnea, PND, increased LE swelling, palpitations, dizziness or syncope.   Pt denies polydipsia, polyuria,  Potassium pill makes her nausea, resolved without.  Does not want to take.  No other interval change or new compalints  Pt denies new neurological symptoms such as new headache, or facial or extremity weakness or numbness  Past Medical History:  Diagnosis Date  . CTS (carpal tunnel syndrome)    bilateral  . FATIGUE 08/27/2008  . GLUCOSE INTOLERANCE 06/25/2008  . HYPERLIPIDEMIA 06/25/2008  . HYPERTENSION 06/25/2008  . Impaired glucose tolerance 06/12/2010  . SCHIZOPHRENIA 06/25/2008   Past Surgical History:  Procedure Laterality Date  . DILATION AND CURETTAGE OF UTERUS  1999   hx of after miscarriage  . s/p cervical procedure  1995   LEEP    reports that she has quit smoking. She quit smokeless tobacco use about 41 years ago. She reports that she does not drink alcohol. Her drug history is not on file. family history includes Hypertension in her other; Stroke in her other. Allergies  Allergen Reactions  . Nabumetone     REACTION: rash   Current Outpatient Medications on File Prior to Visit  Medication Sig Dispense Refill  . aspirin 81 MG EC tablet Take 81 mg by mouth daily.      . benztropine (COGENTIN) 2 MG tablet Take 1 tablet (2 mg total) by mouth daily. 90 tablet 3  . escitalopram (LEXAPRO) 5 MG tablet Take 5 mg by mouth daily.    . haloperidol (HALDOL) 10 MG tablet Take 15 mg by mouth daily. Take 1 1/2 tab by mouth daily    . lisinopril-hydrochlorothiazide (PRINZIDE,ZESTORETIC) 20-12.5 MG tablet Take 2 tablets by mouth daily. 180 tablet 3  . lovastatin  (MEVACOR) 40 MG tablet Take 1 tablet (40 mg total) by mouth daily. 90 tablet 3  . potassium chloride (K-DUR) 10 MEQ tablet Take 1 tablet (10 mEq total) by mouth daily. 90 tablet 3   No current facility-administered medications on file prior to visit.    Review of Systems  Constitutional: Negative for other unusual diaphoresis or sweats HENT: Negative for ear discharge or swelling Eyes: Negative for other worsening visual disturbances Respiratory: Negative for stridor or other swelling  Gastrointestinal: Negative for worsening distension or other blood Genitourinary: Negative for retention or other urinary change Musculoskeletal: Negative for other MSK pain or swelling Skin: Negative for color change or other new lesions Neurological: Negative for worsening tremors and other numbness  Psychiatric/Behavioral: Negative for worsening agitation or other fatigue All other system neg per pt    Objective:   Physical Exam BP 118/84   Pulse (!) 106   Temp 98.4 F (36.9 C) (Oral)   Ht 5\' 8"  (1.727 m)   Wt 170 lb (77.1 kg)   SpO2 100%   BMI 25.85 kg/m  VS noted,  Constitutional: Pt appears in NAD HENT: Head: NCAT.  Right Ear: External ear normal.  Left Ear: External ear normal.  Eyes: . Pupils are equal, round, and reactive to light. Conjunctivae and EOM are normal Nose: without d/c or deformity Neck: Neck supple. Johney Maine  normal ROM Cardiovascular: Normal rate and regular rhythm.   Pulmonary/Chest: Effort normal and breath sounds without rales or wheezing.  Abd:  Soft, NT, ND, + BS, no organomegaly Neurological: Pt is alert. At baseline orientation, motor grossly intact Skin: Skin is warm. No rashes, other new lesions, no LE edema Psychiatric: Pt behavior is normal without agitation  No other exam findings       Assessment & Plan:

## 2017-05-01 NOTE — Telephone Encounter (Signed)
Narcissa for change indocin to colchicine asd - done erx

## 2017-05-01 NOTE — Assessment & Plan Note (Signed)
stable overall by history and exam, recent data reviewed with pt, and pt to continue medical treatment as before,  to f/u any worsening symptoms or concern Lab Results  Component Value Date   HGBA1C 5.9 04/27/2015

## 2017-05-01 NOTE — Telephone Encounter (Signed)
Copied from [place]. Topic: Quick Communication - See Telephone Encounter >> May 01, 2017 12:22 PM Bea Graff, NT wrote: CRM for notification. See Telephone encounter for: Pt states that her pharmacy called her and said that she is allergic to the indomethacin (INDOCIN). Need something else ordered.  05/01/17.

## 2017-05-01 NOTE — Patient Instructions (Signed)
Please take all new medication as prescribed - the medication for gout if it tries to come back  Please continue all other medications as before, and refills have been done if requested.  Please have the pharmacy call with any other refills you may need.  Please continue your efforts at being more active, low cholesterol diet, and weight control  Please keep your appointments with your specialists as you may have planned

## 2017-05-01 NOTE — Assessment & Plan Note (Signed)
stable overall by history and exam, recent data reviewed with pt, and pt to continue medical treatment as before,  to f/u any worsening symptoms or concerns Lab Results  Component Value Date   HGBA1C 5.9 04/27/2015

## 2017-05-03 ENCOUNTER — Other Ambulatory Visit: Payer: Self-pay

## 2017-05-03 ENCOUNTER — Inpatient Hospital Stay (HOSPITAL_COMMUNITY)
Admission: EM | Admit: 2017-05-03 | Discharge: 2017-05-08 | DRG: 064 | Disposition: A | Discharge status: Rehabilitation Facility | Payer: Medicare Other | Attending: Family Medicine | Admitting: Family Medicine

## 2017-05-03 ENCOUNTER — Encounter (HOSPITAL_COMMUNITY): Payer: Self-pay | Admitting: Emergency Medicine

## 2017-05-03 ENCOUNTER — Telehealth: Payer: Self-pay | Admitting: *Deleted

## 2017-05-03 DIAGNOSIS — Z7982 Long term (current) use of aspirin: Secondary | ICD-10-CM

## 2017-05-03 DIAGNOSIS — J986 Disorders of diaphragm: Secondary | ICD-10-CM | POA: Diagnosis not present

## 2017-05-03 DIAGNOSIS — I21A1 Myocardial infarction type 2: Secondary | ICD-10-CM | POA: Diagnosis not present

## 2017-05-03 DIAGNOSIS — D696 Thrombocytopenia, unspecified: Secondary | ICD-10-CM | POA: Diagnosis present

## 2017-05-03 DIAGNOSIS — E861 Hypovolemia: Secondary | ICD-10-CM | POA: Diagnosis present

## 2017-05-03 DIAGNOSIS — H538 Other visual disturbances: Secondary | ICD-10-CM | POA: Diagnosis present

## 2017-05-03 DIAGNOSIS — I2602 Saddle embolus of pulmonary artery with acute cor pulmonale: Secondary | ICD-10-CM | POA: Diagnosis not present

## 2017-05-03 DIAGNOSIS — I82441 Acute embolism and thrombosis of right tibial vein: Secondary | ICD-10-CM | POA: Diagnosis present

## 2017-05-03 DIAGNOSIS — E871 Hypo-osmolality and hyponatremia: Secondary | ICD-10-CM | POA: Diagnosis not present

## 2017-05-03 DIAGNOSIS — G8191 Hemiplegia, unspecified affecting right dominant side: Secondary | ICD-10-CM | POA: Diagnosis present

## 2017-05-03 DIAGNOSIS — C799 Secondary malignant neoplasm of unspecified site: Secondary | ICD-10-CM

## 2017-05-03 DIAGNOSIS — F209 Schizophrenia, unspecified: Secondary | ICD-10-CM | POA: Diagnosis present

## 2017-05-03 DIAGNOSIS — R482 Apraxia: Secondary | ICD-10-CM | POA: Diagnosis present

## 2017-05-03 DIAGNOSIS — N390 Urinary tract infection, site not specified: Secondary | ICD-10-CM | POA: Diagnosis present

## 2017-05-03 DIAGNOSIS — I2510 Atherosclerotic heart disease of native coronary artery without angina pectoris: Secondary | ICD-10-CM | POA: Diagnosis present

## 2017-05-03 DIAGNOSIS — Z66 Do not resuscitate: Secondary | ICD-10-CM

## 2017-05-03 DIAGNOSIS — R180 Malignant ascites: Secondary | ICD-10-CM

## 2017-05-03 DIAGNOSIS — R5381 Other malaise: Secondary | ICD-10-CM | POA: Diagnosis present

## 2017-05-03 DIAGNOSIS — I6521 Occlusion and stenosis of right carotid artery: Secondary | ICD-10-CM | POA: Diagnosis present

## 2017-05-03 DIAGNOSIS — N179 Acute kidney failure, unspecified: Secondary | ICD-10-CM | POA: Diagnosis present

## 2017-05-03 DIAGNOSIS — I82411 Acute embolism and thrombosis of right femoral vein: Secondary | ICD-10-CM | POA: Diagnosis present

## 2017-05-03 DIAGNOSIS — R188 Other ascites: Secondary | ICD-10-CM

## 2017-05-03 DIAGNOSIS — R29702 NIHSS score 2: Secondary | ICD-10-CM | POA: Diagnosis present

## 2017-05-03 DIAGNOSIS — Z87891 Personal history of nicotine dependence: Secondary | ICD-10-CM

## 2017-05-03 DIAGNOSIS — I639 Cerebral infarction, unspecified: Secondary | ICD-10-CM | POA: Diagnosis not present

## 2017-05-03 DIAGNOSIS — D649 Anemia, unspecified: Secondary | ICD-10-CM | POA: Diagnosis present

## 2017-05-03 DIAGNOSIS — R531 Weakness: Secondary | ICD-10-CM | POA: Diagnosis not present

## 2017-05-03 DIAGNOSIS — I82431 Acute embolism and thrombosis of right popliteal vein: Secondary | ICD-10-CM

## 2017-05-03 DIAGNOSIS — I82890 Acute embolism and thrombosis of other specified veins: Secondary | ICD-10-CM | POA: Diagnosis present

## 2017-05-03 DIAGNOSIS — C7989 Secondary malignant neoplasm of other specified sites: Secondary | ICD-10-CM | POA: Diagnosis present

## 2017-05-03 DIAGNOSIS — D6869 Other thrombophilia: Secondary | ICD-10-CM | POA: Diagnosis present

## 2017-05-03 DIAGNOSIS — I10 Essential (primary) hypertension: Secondary | ICD-10-CM | POA: Diagnosis present

## 2017-05-03 DIAGNOSIS — Z888 Allergy status to other drugs, medicaments and biological substances status: Secondary | ICD-10-CM

## 2017-05-03 DIAGNOSIS — C801 Malignant (primary) neoplasm, unspecified: Secondary | ICD-10-CM | POA: Diagnosis present

## 2017-05-03 DIAGNOSIS — Z79899 Other long term (current) drug therapy: Secondary | ICD-10-CM

## 2017-05-03 DIAGNOSIS — E785 Hyperlipidemia, unspecified: Secondary | ICD-10-CM | POA: Diagnosis present

## 2017-05-03 DIAGNOSIS — I82811 Embolism and thrombosis of superficial veins of right lower extremities: Secondary | ICD-10-CM | POA: Diagnosis present

## 2017-05-03 DIAGNOSIS — R404 Transient alteration of awareness: Secondary | ICD-10-CM | POA: Diagnosis not present

## 2017-05-03 DIAGNOSIS — C7800 Secondary malignant neoplasm of unspecified lung: Secondary | ICD-10-CM | POA: Diagnosis present

## 2017-05-03 DIAGNOSIS — Z515 Encounter for palliative care: Secondary | ICD-10-CM

## 2017-05-03 MED ORDER — SODIUM CHLORIDE 0.9 % IV SOLN: 100.0000 mL/h | INTRAVENOUS | Status: DC

## 2017-05-03 MED ORDER — SODIUM CHLORIDE 0.9 % IV BOLUS (SEPSIS)
500.0000 mL | Freq: Once | INTRAVENOUS | Status: AC
  Administered 2017-05-03: 500 mL via INTRAVENOUS

## 2017-05-03 NOTE — ED Triage Notes (Signed)
Patient with right sided weakness that started on Tuesday.  She had a Dr's appt and has gotten progressively worse and not getting out of bed since then.  She is dizzy and just not feeling well.  CAOx4, no pain.  She is orthostatic and tachycardic with EMS.

## 2017-05-03 NOTE — Telephone Encounter (Signed)
Ok this is done 

## 2017-05-03 NOTE — Telephone Encounter (Signed)
Copied from [place] [id number]. Topic: Referral - Status >> May 03, 2017  8:58 AM Aurelio Brash B wrote: Reason for CRM: PT is requesting a referral to eye Dr.   [name] states her eyesight is not good.  PT states she thinks its her age,  eyesight has gradually got worse over the years   MD is out of the office until Monday will hold until her return.Marland KitchenJohny [name]

## 2017-05-03 NOTE — ED Provider Notes (Signed)
[facility] EMERGENCY DEPARTMENT Provider Note   CSN: [id number] Arrival date & time: 05/03/17  2333     History   Chief Complaint Chief Complaint  Patient presents with  . Weakness    HPI [name] [name] is a 67 y.o. female.  Level 5 caveat for altered mental status.  Patient presents via EMS with concern for stroke.  Husband at bedside reports she is been progressively worsening for the past 2 days has not been able to walk or get out of bed.  She complains of feeling dizzy and not feeling well.  She saw her doctor 2 days ago for gout and her last seen normal was the evening of March 12.  EMS found her to have right-sided weakness with drift of the right arm and right leg.  Patient denies any headache, chest pain, shortness of breath, abdominal pain, nausea, vomiting or fever.  She has a history of hypertension, hyperlipidemia and schizophrenia.  Husband reports she did take an extra dose of Haldol.  EMS states she was orthostatic and tachycardic with standing.   The history is provided by the EMS personnel, a relative and the patient. The history is limited by the condition of the patient.  Weakness  Primary symptoms include dizziness. Pertinent negatives include no shortness of breath, no chest pain and no vomiting.    Past Medical History:  Diagnosis Date  . CTS (carpal tunnel syndrome)    bilateral  . FATIGUE 08/27/2008  . GLUCOSE INTOLERANCE 06/25/2008  . HYPERLIPIDEMIA 06/25/2008  . HYPERTENSION 06/25/2008  . Impaired glucose tolerance 06/12/2010  . SCHIZOPHRENIA 06/25/2008    Patient Active Problem List   Diagnosis Date Noted  . Acute gouty arthritis 05/01/2017  . Impaired glucose tolerance 06/12/2010  . Preventative health care 06/12/2010  . FATIGUE 08/27/2008  . Hyperlipidemia 06/25/2008  . SCHIZOPHRENIA 06/25/2008  . Essential hypertension 06/25/2008    Past Surgical History:  Procedure Laterality Date  . DILATION AND CURETTAGE OF UTERUS  1999   hx of after miscarriage  . s/p cervical procedure  1995   LEEP    OB History    No data available       Home Medications    Prior to Admission medications   Medication Sig Start Date End Date Taking? Authorizing Provider  aspirin 81 MG EC tablet Take 81 mg by mouth daily.      [provider]  benztropine (COGENTIN) 2 MG tablet Take 1 tablet (2 mg total) by mouth daily. 06/15/10   Biagio Borg, MD  colchicine 0.6 MG tablet 1 tab by mouth as needed for gout pain and swelling with repeat 1 per hour until improved or diarrhea 05/01/17   Biagio Borg, MD  escitalopram (LEXAPRO) 5 MG tablet Take 5 mg by mouth daily.    [provider]  haloperidol (HALDOL) 10 MG tablet Take 15 mg by mouth daily. Take 1 1/2 tab by mouth daily    [provider]  lisinopril-hydrochlorothiazide (PRINZIDE,ZESTORETIC) 20-12.5 MG tablet Take 2 tablets by mouth daily. 09/26/16   Biagio Borg, MD  lovastatin (MEVACOR) 40 MG tablet Take 1 tablet (40 mg total) by mouth daily. 09/26/16   Biagio Borg, MD  potassium chloride (K-DUR) 10 MEQ tablet Take 1 tablet (10 mEq total) by mouth daily. 04/16/17   Biagio Borg, MD    Family History Family History  Problem Relation Age of Onset  . Hypertension Other   . Stroke Other  Social History Social History   Tobacco Use  . Smoking status: Former Research scientist (life sciences)  . Smokeless tobacco: Former Systems developer    Quit date: 02/21/1976  . Tobacco comment: quit a long time ago;   Substance Use Topics  . Alcohol use: No    Alcohol/week: 0.0 oz  . Drug use: Not on file     Allergies   Nabumetone   Review of Systems Review of Systems  Constitutional: Positive for activity change, appetite change and fatigue. Negative for fever.  HENT: Negative for congestion and rhinorrhea.   Eyes: Negative for visual disturbance.  Respiratory: Negative for cough, chest tightness and shortness of breath.   Cardiovascular: Negative for chest pain.  Gastrointestinal:  Negative for abdominal pain, nausea and vomiting.  Genitourinary: Negative for dysuria, vaginal bleeding and vaginal discharge.  Musculoskeletal: Negative for arthralgias and myalgias.  Neurological: Positive for dizziness, weakness and light-headedness. Negative for speech difficulty.   all other systems are negative except as noted in the HPI and PMH.     Physical Exam Updated Vital Signs BP 112/79 (BP Location: Left Arm)   Pulse (!) 123   Temp 98.7 F (37.1 C) (Oral)   Resp 19   Ht 5\' 8"  (1.727 m)   Wt 79.8 kg (175 lb 14.8 oz)   SpO2 97%   BMI 26.75 kg/m   Physical Exam  Constitutional: She is oriented to person, place, and time. She appears well-developed and well-nourished. No distress.  HENT:  Head: Normocephalic and atraumatic.  Mouth/Throat: Oropharynx is clear and moist. No oropharyngeal exudate.  Mildly dry mucous membranes  Eyes: Conjunctivae and EOM are normal. Pupils are equal, round, and reactive to light.  Neck: Normal range of motion. Neck supple.  No meningismus.  Cardiovascular: Normal rate, normal heart sounds and intact distal pulses.  No murmur heard. Tachycardic 120s  Pulmonary/Chest: Effort normal and breath sounds normal. No respiratory distress.  Abdominal: Soft. There is no tenderness. There is no rebound and no guarding.  Musculoskeletal: Normal range of motion. She exhibits no edema or tenderness.  Neurological: She is alert and oriented to person, place, and time. No cranial nerve deficit. She exhibits normal muscle tone. Coordination normal.  Patient is alert and oriented x3.  She has decreased grip strength on the right.  There is some difficulty holding right arm up against gravity.  Right arm pronator drift.  Difficulty raising right leg off the bed especially with weakness from knee down.  Unable to flex or extend ankle.  No  Skin: Skin is warm. Capillary refill takes less than 2 seconds.  Psychiatric: She has a normal mood and affect. Her  behavior is normal.  Nursing note and vitals reviewed.    ED Treatments / Results  Labs (all labs ordered are listed, but only abnormal results are displayed) Labs Reviewed  PROTIME-INR - Abnormal; Notable for the following components:      Result Value   Prothrombin Time 16.2 (*)    All other components within normal limits  CBC - Abnormal; Notable for the following components:   WBC 12.2 (*)    RBC 3.85 (*)    Hemoglobin 10.3 (*)    HCT 30.8 (*)    RDW 15.9 (*)    Platelets 121 (*)    All other components within normal limits  DIFFERENTIAL - Abnormal; Notable for the following components:   Neutro Abs 10.0 (*)    Monocytes Absolute 1.1 (*)    All other components within normal limits  COMPREHENSIVE METABOLIC PANEL - Abnormal; Notable for the following components:   Sodium 133 (*)    Chloride 100 (*)    Glucose, Bld 132 (*)    Creatinine, Ser 1.63 (*)    Calcium 8.4 (*)    Total Protein 6.2 (*)    Albumin 2.4 (*)    ALT 8 (*)    GFR calc non Af Amer 32 (*)    GFR calc Af Amer 37 (*)    All other components within normal limits  URINALYSIS, ROUTINE W REFLEX MICROSCOPIC - Abnormal; Notable for the following components:   APPearance HAZY (*)    Hgb urine dipstick MODERATE (*)    Protein, ur 100 (*)    Leukocytes, UA LARGE (*)    Bacteria, UA RARE (*)    Squamous Epithelial / LPF 0-5 (*)    All other components within normal limits  TROPONIN I - Abnormal; Notable for the following components:   Troponin I 0.12 (*)    All other components within normal limits  HEMOGLOBIN A1C - Abnormal; Notable for the following components:   Hgb A1c MFr Bld 6.2 (*)    All other components within normal limits  LIPID PANEL - Abnormal; Notable for the following components:   HDL 38 (*)    All other components within normal limits  BASIC METABOLIC PANEL - Abnormal; Notable for the following components:   Sodium 131 (*)    CO2 20 (*)    Glucose, Bld 103 (*)    Creatinine, Ser 1.48  (*)    Calcium 8.3 (*)    GFR calc non Af Amer 36 (*)    GFR calc Af Amer 42 (*)    All other components within normal limits  CBC - Abnormal; Notable for the following components:   WBC 10.9 (*)    Hemoglobin 10.3 (*)    HCT 31.0 (*)    RDW 15.9 (*)    Platelets 121 (*)    All other components within normal limits  VITAMIN B12 - Abnormal; Notable for the following components:   Vitamin B-12 114 (*)    All other components within normal limits  FOLATE - Abnormal; Notable for the following components:   Folate 4.4 (*)    All other components within normal limits  IRON AND TIBC - Abnormal; Notable for the following components:   Iron 16 (*)    TIBC 167 (*)    Saturation Ratios 10 (*)    All other components within normal limits  FERRITIN - Abnormal; Notable for the following components:   Ferritin 528 (*)    All other components within normal limits  I-STAT CHEM 8, ED - Abnormal; Notable for the following components:   Sodium 132 (*)    Chloride 98 (*)    Creatinine, Ser 1.50 (*)    Glucose, Bld 128 (*)    Calcium, Ion 1.14 (*)    Hemoglobin 10.9 (*)    HCT 32.0 (*)    All other components within normal limits  I-STAT TROPONIN, ED - Abnormal; Notable for the following components:   Troponin i, poc 0.15 (*)    All other components within normal limits  URINE CULTURE  ETHANOL  APTT  RAPID URINE DRUG SCREEN, HOSP PERFORMED  RETICULOCYTES  TROPONIN I  TROPONIN I  POC OCCULT BLOOD, ED  TYPE AND SCREEN  ABO/RH    EKG  EKG Interpretation  Date/Time:  Friday May 04 2017 00:40:01 EDT Ventricular Rate:  109 PR Interval:    QRS Duration: 58 QT Interval:  331 QTC Calculation: 446 R Axis:   116 Text Interpretation:  Sinus or ectopic atrial tachycardia Right axis deviation Nonspecific T abnrm, anterolateral leads Minimal ST elevation, inferior leads No previous ECGs available Confirmed by Ezequiel Essex [phone]) on 05/04/2017 1:08:28 AM Also confirmed by Ezequiel Essex  [phone]), editor Oswaldo Milian, [place] ([id number])  on 05/04/2017 6:58:29 AM       Radiology Dg Chest 2 View  Result Date: 05/04/2017 CLINICAL DATA:  Acute onset of right-sided weakness. Dizziness. Tachycardia and orthostatic hypotension. EXAM: CHEST - 2 VIEW COMPARISON:  None. FINDINGS: There is mild elevation of the right hemidiaphragm. There is no evidence of focal opacification, pleural effusion or pneumothorax. The heart is normal in size; the mediastinal contour is within normal limits. No acute osseous abnormalities are seen. IMPRESSION: Mild elevation of the right hemidiaphragm. Lungs remain grossly clear. Electronically Signed   By: Garald Balding M.D.   On: 05/04/2017 01:25   Ct Head Wo Contrast  Result Date: 05/04/2017 CLINICAL DATA:  Right-sided weakness and dizziness EXAM: CT HEAD WITHOUT CONTRAST TECHNIQUE: Contiguous axial images were obtained from the base of the skull through the vertex without intravenous contrast. COMPARISON:  None. FINDINGS: Brain: Areas of focal hypoattenuation within both occipital lobes suggesting recent infarction. No hemorrhage or mass effect. Otherwise normal appearance of the brain parenchyma and extra axial spaces for age. Vascular: No hyperdense vessel or unexpected vascular calcification. Skull: Normal visualized skull base, calvarium and extracranial soft tissues. Sinuses/Orbits: No sinus fluid levels or advanced mucosal thickening. No mastoid effusion. Normal orbits. 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 MRI recommended. These results were called by telephone at the time of interpretation on 05/04/2017 at 1:23 am to Dr. Ezequiel Essex , who verbally acknowledged these results. Electronically Signed   By: Ulyses Jarred M.D.   On: 05/04/2017 01:23    Procedures Procedures (including critical care time)  Medications Ordered in ED Medications  sodium chloride 0.9 % bolus 500 mL (not  administered)    Followed by  0.9 %  sodium chloride infusion (not administered)     Initial Impression / Assessment and Plan / ED Course  I have reviewed the triage vital signs and the nursing notes.  Pertinent labs & imaging results that were available during my care of the patient were reviewed by me and considered in my medical decision making (see chart for details).    Patient with altered mental status and right-sided weakness for the past 2 days.  Code stroke not activated due to delay in presentation.  Patient found to be tachycardic and dry appearing.  She is given IV fluids.  Patient not a TPA candidate due to delayed presentation.  Hemoglobin has dropped 4 g in the past year.  FOBT is negative.  Denies any dark or bloody stools.  CT head shows subacute occipital infarcts. Does not explain R sided weakness. D/w Dr. Lorraine Lax of neurology who will evaluate.   IV Hydration given.  Tachycardia improving. Labs show AK I with drop in hemoglobin.  Hemoccult is negative. Mild troponin elevation without chest pain or shortness of breath.  Patient will be admitted for stroke workup.  Discussed with Dr. Myna Hidalgo.  CRITICAL CARE Performed by: Ezequiel Essex Total critical care time: 34 minutes Critical care time was exclusive of separately billable procedures and treating other patients. Critical care was necessary to treat or prevent imminent or  life-threatening deterioration. Critical care was time spent personally by me on the following activities: development of treatment plan with patient and/or surrogate as well as nursing, discussions with consultants, evaluation of patient's response to treatment, examination of patient, obtaining history from patient or surrogate, ordering and performing treatments and interventions, ordering and review of laboratory studies, ordering and review of radiographic studies, pulse oximetry and re-evaluation of patient's condition.   Final Clinical  Impressions(s) / ED Diagnoses   Final diagnoses:  None    ED Discharge Orders    None       Kailand Seda, Annie Main, MD 05/04/17 [id number]

## 2017-05-04 ENCOUNTER — Encounter (HOSPITAL_COMMUNITY): Payer: Self-pay | Admitting: *Deleted

## 2017-05-04 ENCOUNTER — Emergency Department (HOSPITAL_COMMUNITY): Payer: Medicare Other

## 2017-05-04 ENCOUNTER — Other Ambulatory Visit: Payer: Self-pay

## 2017-05-04 ENCOUNTER — Inpatient Hospital Stay (HOSPITAL_COMMUNITY): Payer: Medicare Other

## 2017-05-04 ENCOUNTER — Observation Stay (HOSPITAL_BASED_OUTPATIENT_CLINIC_OR_DEPARTMENT_OTHER): Payer: Medicare Other

## 2017-05-04 ENCOUNTER — Observation Stay (HOSPITAL_COMMUNITY): Payer: Medicare Other

## 2017-05-04 DIAGNOSIS — I10 Essential (primary) hypertension: Secondary | ICD-10-CM | POA: Diagnosis not present

## 2017-05-04 DIAGNOSIS — Z87891 Personal history of nicotine dependence: Secondary | ICD-10-CM | POA: Diagnosis not present

## 2017-05-04 DIAGNOSIS — I21A1 Myocardial infarction type 2: Secondary | ICD-10-CM | POA: Diagnosis present

## 2017-05-04 DIAGNOSIS — I2602 Saddle embolus of pulmonary artery with acute cor pulmonale: Secondary | ICD-10-CM

## 2017-05-04 DIAGNOSIS — I63433 Cerebral infarction due to embolism of bilateral posterior cerebral arteries: Secondary | ICD-10-CM | POA: Diagnosis not present

## 2017-05-04 DIAGNOSIS — I82811 Embolism and thrombosis of superficial veins of right lower extremities: Secondary | ICD-10-CM | POA: Diagnosis present

## 2017-05-04 DIAGNOSIS — I82441 Acute embolism and thrombosis of right tibial vein: Secondary | ICD-10-CM | POA: Diagnosis present

## 2017-05-04 DIAGNOSIS — N179 Acute kidney failure, unspecified: Secondary | ICD-10-CM | POA: Diagnosis not present

## 2017-05-04 DIAGNOSIS — D6869 Other thrombophilia: Secondary | ICD-10-CM | POA: Diagnosis present

## 2017-05-04 DIAGNOSIS — D649 Anemia, unspecified: Secondary | ICD-10-CM | POA: Diagnosis present

## 2017-05-04 DIAGNOSIS — E861 Hypovolemia: Secondary | ICD-10-CM | POA: Diagnosis present

## 2017-05-04 DIAGNOSIS — G8191 Hemiplegia, unspecified affecting right dominant side: Secondary | ICD-10-CM | POA: Diagnosis not present

## 2017-05-04 DIAGNOSIS — R7989 Other specified abnormal findings of blood chemistry: Secondary | ICD-10-CM | POA: Diagnosis not present

## 2017-05-04 DIAGNOSIS — C7802 Secondary malignant neoplasm of left lung: Secondary | ICD-10-CM | POA: Diagnosis not present

## 2017-05-04 DIAGNOSIS — I82411 Acute embolism and thrombosis of right femoral vein: Secondary | ICD-10-CM | POA: Diagnosis present

## 2017-05-04 DIAGNOSIS — D62 Acute posthemorrhagic anemia: Secondary | ICD-10-CM | POA: Diagnosis not present

## 2017-05-04 DIAGNOSIS — R180 Malignant ascites: Secondary | ICD-10-CM | POA: Diagnosis not present

## 2017-05-04 DIAGNOSIS — Z515 Encounter for palliative care: Secondary | ICD-10-CM | POA: Diagnosis not present

## 2017-05-04 DIAGNOSIS — I82431 Acute embolism and thrombosis of right popliteal vein: Secondary | ICD-10-CM

## 2017-05-04 DIAGNOSIS — Z66 Do not resuscitate: Secondary | ICD-10-CM | POA: Diagnosis not present

## 2017-05-04 DIAGNOSIS — E871 Hypo-osmolality and hyponatremia: Secondary | ICD-10-CM | POA: Diagnosis not present

## 2017-05-04 DIAGNOSIS — F209 Schizophrenia, unspecified: Secondary | ICD-10-CM

## 2017-05-04 DIAGNOSIS — N390 Urinary tract infection, site not specified: Secondary | ICD-10-CM | POA: Diagnosis not present

## 2017-05-04 DIAGNOSIS — J986 Disorders of diaphragm: Secondary | ICD-10-CM | POA: Diagnosis not present

## 2017-05-04 DIAGNOSIS — I639 Cerebral infarction, unspecified: Secondary | ICD-10-CM

## 2017-05-04 DIAGNOSIS — I2699 Other pulmonary embolism without acute cor pulmonale: Secondary | ICD-10-CM | POA: Diagnosis not present

## 2017-05-04 DIAGNOSIS — I69351 Hemiplegia and hemiparesis following cerebral infarction affecting right dominant side: Secondary | ICD-10-CM | POA: Diagnosis not present

## 2017-05-04 DIAGNOSIS — C482 Malignant neoplasm of peritoneum, unspecified: Secondary | ICD-10-CM | POA: Diagnosis not present

## 2017-05-04 DIAGNOSIS — C7800 Secondary malignant neoplasm of unspecified lung: Secondary | ICD-10-CM | POA: Diagnosis present

## 2017-05-04 DIAGNOSIS — I2510 Atherosclerotic heart disease of native coronary artery without angina pectoris: Secondary | ICD-10-CM | POA: Diagnosis present

## 2017-05-04 DIAGNOSIS — C7801 Secondary malignant neoplasm of right lung: Secondary | ICD-10-CM | POA: Diagnosis not present

## 2017-05-04 DIAGNOSIS — H538 Other visual disturbances: Secondary | ICD-10-CM | POA: Diagnosis present

## 2017-05-04 DIAGNOSIS — C7989 Secondary malignant neoplasm of other specified sites: Secondary | ICD-10-CM | POA: Diagnosis present

## 2017-05-04 DIAGNOSIS — E785 Hyperlipidemia, unspecified: Secondary | ICD-10-CM | POA: Diagnosis present

## 2017-05-04 DIAGNOSIS — R188 Other ascites: Secondary | ICD-10-CM | POA: Diagnosis not present

## 2017-05-04 DIAGNOSIS — D72829 Elevated white blood cell count, unspecified: Secondary | ICD-10-CM | POA: Diagnosis not present

## 2017-05-04 DIAGNOSIS — I82890 Acute embolism and thrombosis of other specified veins: Secondary | ICD-10-CM | POA: Diagnosis present

## 2017-05-04 DIAGNOSIS — C799 Secondary malignant neoplasm of unspecified site: Secondary | ICD-10-CM | POA: Diagnosis not present

## 2017-05-04 DIAGNOSIS — E8809 Other disorders of plasma-protein metabolism, not elsewhere classified: Secondary | ICD-10-CM | POA: Diagnosis not present

## 2017-05-04 LAB — TROPONIN I
TROPONIN I: 0.12 ng/mL — AB (ref <0.03)
TROPONIN I: 0.13 ng/mL — AB (ref <0.03)
Troponin I: 0.15 ng/mL (ref <0.03)

## 2017-05-04 LAB — CBC
HEMATOCRIT: 30.8 % — AB (ref 36.0–46.0)
HEMATOCRIT: 31 % — AB (ref 36.0–46.0)
HEMOGLOBIN: 10.3 g/dL — AB (ref 12.0–15.0)
Hemoglobin: 10.3 g/dL — ABNORMAL LOW (ref 12.0–15.0)
MCH: 26.4 pg (ref 26.0–34.0)
MCH: 26.8 pg (ref 26.0–34.0)
MCHC: 33.2 g/dL (ref 30.0–36.0)
MCHC: 33.4 g/dL (ref 30.0–36.0)
MCV: 79.5 fL (ref 78.0–100.0)
MCV: 80 fL (ref 78.0–100.0)
Platelets: 121 10*3/uL — ABNORMAL LOW (ref 150–400)
Platelets: 121 10*3/uL — ABNORMAL LOW (ref 150–400)
RBC: 3.85 MIL/uL — ABNORMAL LOW (ref 3.87–5.11)
RBC: 3.9 MIL/uL (ref 3.87–5.11)
RDW: 15.9 % — AB (ref 11.5–15.5)
RDW: 15.9 % — ABNORMAL HIGH (ref 11.5–15.5)
WBC: 10.9 10*3/uL — AB (ref 4.0–10.5)
WBC: 12.2 10*3/uL — ABNORMAL HIGH (ref 4.0–10.5)

## 2017-05-04 LAB — RAPID URINE DRUG SCREEN, HOSP PERFORMED
Amphetamines: NOT DETECTED
Barbiturates: NOT DETECTED
Benzodiazepines: NOT DETECTED
COCAINE: NOT DETECTED
OPIATES: NOT DETECTED
Tetrahydrocannabinol: NOT DETECTED

## 2017-05-04 LAB — I-STAT TROPONIN, ED: TROPONIN I, POC: 0.15 ng/mL — AB (ref 0.00–0.08)

## 2017-05-04 LAB — HEMOGLOBIN A1C
HEMOGLOBIN A1C: 6.2 % — AB (ref 4.8–5.6)
MEAN PLASMA GLUCOSE: 131.24 mg/dL

## 2017-05-04 LAB — COMPREHENSIVE METABOLIC PANEL
ALT: 8 U/L — ABNORMAL LOW (ref 14–54)
ANION GAP: 11 (ref 5–15)
AST: 18 U/L (ref 15–41)
Albumin: 2.4 g/dL — ABNORMAL LOW (ref 3.5–5.0)
Alkaline Phosphatase: 54 U/L (ref 38–126)
BUN: 15 mg/dL (ref 6–20)
CHLORIDE: 100 mmol/L — AB (ref 101–111)
CO2: 22 mmol/L (ref 22–32)
Calcium: 8.4 mg/dL — ABNORMAL LOW (ref 8.9–10.3)
Creatinine, Ser: 1.63 mg/dL — ABNORMAL HIGH (ref 0.44–1.00)
GFR, EST AFRICAN AMERICAN: 37 mL/min — AB (ref >60)
GFR, EST NON AFRICAN AMERICAN: 32 mL/min — AB (ref >60)
Glucose, Bld: 132 mg/dL — ABNORMAL HIGH (ref 65–99)
POTASSIUM: 3.7 mmol/L (ref 3.5–5.1)
Sodium: 133 mmol/L — ABNORMAL LOW (ref 135–145)
Total Bilirubin: 0.6 mg/dL (ref 0.3–1.2)
Total Protein: 6.2 g/dL — ABNORMAL LOW (ref 6.5–8.1)

## 2017-05-04 LAB — URINALYSIS, ROUTINE W REFLEX MICROSCOPIC
BILIRUBIN URINE: NEGATIVE
Glucose, UA: NEGATIVE mg/dL
KETONES UR: NEGATIVE mg/dL
Nitrite: NEGATIVE
Protein, ur: 100 mg/dL — AB
Specific Gravity, Urine: 1.009 (ref 1.005–1.030)
pH: 6 (ref 5.0–8.0)

## 2017-05-04 LAB — LIPID PANEL
Cholesterol: 137 mg/dL (ref 0–200)
HDL: 38 mg/dL — AB (ref >40)
LDL Cholesterol: 71 mg/dL (ref 0–99)
Total CHOL/HDL Ratio: 3.6 RATIO
Triglycerides: 139 mg/dL (ref <150)
VLDL: 28 mg/dL (ref 0–40)

## 2017-05-04 LAB — I-STAT CHEM 8, ED
BUN: 15 mg/dL (ref 6–20)
Calcium, Ion: 1.14 mmol/L — ABNORMAL LOW (ref 1.15–1.40)
Chloride: 98 mmol/L — ABNORMAL LOW (ref 101–111)
Creatinine, Ser: 1.5 mg/dL — ABNORMAL HIGH (ref 0.44–1.00)
GLUCOSE: 128 mg/dL — AB (ref 65–99)
HEMATOCRIT: 32 % — AB (ref 36.0–46.0)
HEMOGLOBIN: 10.9 g/dL — AB (ref 12.0–15.0)
POTASSIUM: 3.7 mmol/L (ref 3.5–5.1)
SODIUM: 132 mmol/L — AB (ref 135–145)
TCO2: 22 mmol/L (ref 22–32)

## 2017-05-04 LAB — ABO/RH: ABO/RH(D): O POS

## 2017-05-04 LAB — DIFFERENTIAL
BASOS ABS: 0 10*3/uL (ref 0.0–0.1)
BASOS PCT: 0 %
EOS ABS: 0.1 10*3/uL (ref 0.0–0.7)
Eosinophils Relative: 1 %
Lymphocytes Relative: 8 %
Lymphs Abs: 1 10*3/uL (ref 0.7–4.0)
MONO ABS: 1.1 10*3/uL — AB (ref 0.1–1.0)
MONOS PCT: 9 %
Neutro Abs: 10 10*3/uL — ABNORMAL HIGH (ref 1.7–7.7)
Neutrophils Relative %: 82 %

## 2017-05-04 LAB — TYPE AND SCREEN
ABO/RH(D): O POS
ANTIBODY SCREEN: NEGATIVE

## 2017-05-04 LAB — RETICULOCYTES
RBC.: 3.9 MIL/uL (ref 3.87–5.11)
Retic Count, Absolute: 50.7 10*3/uL (ref 19.0–186.0)
Retic Ct Pct: 1.3 % (ref 0.4–3.1)

## 2017-05-04 LAB — BASIC METABOLIC PANEL
ANION GAP: 10 (ref 5–15)
BUN: 14 mg/dL (ref 6–20)
CHLORIDE: 101 mmol/L (ref 101–111)
CO2: 20 mmol/L — ABNORMAL LOW (ref 22–32)
Calcium: 8.3 mg/dL — ABNORMAL LOW (ref 8.9–10.3)
Creatinine, Ser: 1.48 mg/dL — ABNORMAL HIGH (ref 0.44–1.00)
GFR calc non Af Amer: 36 mL/min — ABNORMAL LOW (ref >60)
GFR, EST AFRICAN AMERICAN: 42 mL/min — AB (ref >60)
Glucose, Bld: 103 mg/dL — ABNORMAL HIGH (ref 65–99)
POTASSIUM: 3.8 mmol/L (ref 3.5–5.1)
SODIUM: 131 mmol/L — AB (ref 135–145)

## 2017-05-04 LAB — APTT: APTT: 32 s (ref 24–36)

## 2017-05-04 LAB — PROTIME-INR
INR: 1.32
Prothrombin Time: 16.2 seconds — ABNORMAL HIGH (ref 11.4–15.2)

## 2017-05-04 LAB — IRON AND TIBC
IRON: 16 ug/dL — AB (ref 28–170)
Saturation Ratios: 10 % — ABNORMAL LOW (ref 10.4–31.8)
TIBC: 167 ug/dL — ABNORMAL LOW (ref 250–450)
UIBC: 151 ug/dL

## 2017-05-04 LAB — POC OCCULT BLOOD, ED: Fecal Occult Bld: NEGATIVE

## 2017-05-04 LAB — FOLATE: Folate: 4.4 ng/mL — ABNORMAL LOW (ref >5.9)

## 2017-05-04 LAB — ETHANOL

## 2017-05-04 LAB — FERRITIN: FERRITIN: 528 ng/mL — AB (ref 11–307)

## 2017-05-04 LAB — VITAMIN B12: Vitamin B-12: 114 pg/mL — ABNORMAL LOW (ref 180–914)

## 2017-05-04 MED ORDER — HALOPERIDOL 5 MG PO TABS
15.0000 mg | ORAL_TABLET | Freq: Every day | ORAL | Status: DC
  Administered 2017-05-04 – 2017-05-08 (×5): 15 mg via ORAL
  Filled 2017-05-04 (×6): qty 3

## 2017-05-04 MED ORDER — ACETAMINOPHEN 160 MG/5ML PO SOLN: 650.0000 mg | ORAL | Status: DC | PRN

## 2017-05-04 MED ORDER — ACETAMINOPHEN 325 MG PO TABS
650.0000 mg | ORAL_TABLET | ORAL | Status: DC | PRN
  Administered 2017-05-05 – 2017-05-07 (×9): 650 mg via ORAL
  Filled 2017-05-04 (×9): qty 2

## 2017-05-04 MED ORDER — SODIUM CHLORIDE 0.9 % IV SOLN
1.0000 g | Freq: Every day | INTRAVENOUS | Status: DC
  Administered 2017-05-04 – 2017-05-08 (×4): 1 g via INTRAVENOUS
  Filled 2017-05-04 (×6): qty 10

## 2017-05-04 MED ORDER — PRAVASTATIN SODIUM 40 MG PO TABS
40.0000 mg | ORAL_TABLET | Freq: Every day | ORAL | Status: DC
  Administered 2017-05-04 – 2017-05-07 (×4): 40 mg via ORAL
  Filled 2017-05-04 (×4): qty 1

## 2017-05-04 MED ORDER — ACETAMINOPHEN 650 MG RE SUPP: 650.0000 mg | RECTAL | Status: DC | PRN

## 2017-05-04 MED ORDER — CYANOCOBALAMIN 1000 MCG/ML IJ SOLN
1000.0000 ug | Freq: Once | INTRAMUSCULAR | Status: AC
Start: 2017-05-04 — End: 2017-05-04
  Administered 2017-05-04: 1000 ug via INTRAMUSCULAR
  Filled 2017-05-04: qty 1

## 2017-05-04 MED ORDER — SODIUM CHLORIDE 0.9 % IV BOLUS (SEPSIS)
1000.0000 mL | Freq: Once | INTRAVENOUS | Status: AC
  Administered 2017-05-04: 1000 mL via INTRAVENOUS

## 2017-05-04 MED ORDER — HEPARIN (PORCINE) IN NACL 100-0.45 UNIT/ML-% IJ SOLN
1100.0000 [IU]/h | INTRAMUSCULAR | Status: DC
  Administered 2017-05-04: 1200 [IU]/h via INTRAVENOUS
  Filled 2017-05-04: qty 250

## 2017-05-04 MED ORDER — SENNOSIDES-DOCUSATE SODIUM 8.6-50 MG PO TABS: 1.0000 | ORAL_TABLET | Freq: Every evening | ORAL | Status: DC | PRN

## 2017-05-04 MED ORDER — IOPAMIDOL (ISOVUE-370) INJECTION 76%
INTRAVENOUS | Status: AC
  Administered 2017-05-04: 60 mL
  Filled 2017-05-04: qty 100

## 2017-05-04 MED ORDER — ESCITALOPRAM OXALATE 10 MG PO TABS
5.0000 mg | ORAL_TABLET | Freq: Every day | ORAL | Status: DC
  Administered 2017-05-04 – 2017-05-08 (×5): 5 mg via ORAL
  Filled 2017-05-04 (×5): qty 1

## 2017-05-04 MED ORDER — BENZTROPINE MESYLATE 0.5 MG PO TABS
2.0000 mg | ORAL_TABLET | Freq: Every day | ORAL | Status: DC
  Administered 2017-05-04 – 2017-05-08 (×5): 2 mg via ORAL
  Filled 2017-05-04 (×5): qty 4

## 2017-05-04 MED ORDER — ASPIRIN EC 81 MG PO TBEC
81.0000 mg | DELAYED_RELEASE_TABLET | Freq: Every day | ORAL | Status: DC
  Administered 2017-05-04 – 2017-05-08 (×5): 81 mg via ORAL
  Filled 2017-05-04 (×5): qty 1

## 2017-05-04 MED ORDER — STROKE: EARLY STAGES OF RECOVERY BOOK
Freq: Once | Status: AC
  Administered 2017-05-04: 1

## 2017-05-04 MED ORDER — SODIUM CHLORIDE 0.9 % IV SOLN
INTRAVENOUS | Status: AC
  Administered 2017-05-04: 16:00:00 via INTRAVENOUS

## 2017-05-04 MED ORDER — APIXABAN 5 MG PO TABS
10.0000 mg | ORAL_TABLET | Freq: Two times a day (BID) | ORAL | Status: DC
  Administered 2017-05-04: 10 mg via ORAL
  Filled 2017-05-04: qty 2

## 2017-05-04 MED ORDER — METOPROLOL TARTRATE 5 MG/5ML IV SOLN
5.0000 mg | Freq: Four times a day (QID) | INTRAVENOUS | Status: DC
  Administered 2017-05-04 – 2017-05-05 (×5): 5 mg via INTRAVENOUS
  Filled 2017-05-04 (×6): qty 5

## 2017-05-04 MED ORDER — APIXABAN 5 MG PO TABS: 5.0000 mg | ORAL_TABLET | Freq: Two times a day (BID) | ORAL | Status: DC

## 2017-05-04 MED ORDER — IOPAMIDOL (ISOVUE-370) INJECTION 76%
INTRAVENOUS | Status: AC
  Administered 2017-05-04: 50 mL
  Filled 2017-05-04: qty 50

## 2017-05-04 MED ORDER — VITAMIN B-12 1000 MCG PO TABS
1000.0000 ug | ORAL_TABLET | Freq: Every day | ORAL | Status: DC
  Administered 2017-05-05 – 2017-05-08 (×4): 1000 ug via ORAL
  Filled 2017-05-04 (×4): qty 1

## 2017-05-04 MED ORDER — SODIUM CHLORIDE 0.9 % IV SOLN
INTRAVENOUS | Status: AC
  Administered 2017-05-04: 05:00:00 via INTRAVENOUS

## 2017-05-04 NOTE — Progress Notes (Addendum)
Patient admitted early this morning by my partner.  67 year old female admitted with blurred vision right-sided, and loss of appetite and loss of weight.  Per family she has not been eating or drinking well in the past 1 week.  She is admitted with a diagnosis of subacute stroke.  Her CT of the head showed subacute infarct in bilateral occipital lobes without mass-effect or hemorrhage.  MRI of the brain and MRA of the head and neck echocardiogram is pending at this time.  P T OT and speech eval pending at this time.  Labs shows a sodium of 131 potassium 3.8 mean glucose of 131 BUN of 14 creatinine 1.48.  Troponin 0 0.12-0.13.  EKG with sinus tach no acute changes but with very low voltage.  Doppler of the lower extremity shows acute DVT involving the femoral and posterior tibial veins of the right lower extremity.  Acute superficial vein thrombosis also noted.  Neurology has been consulted appreciate their input.  Will consult cardiology for elevated troponin atypical EKG findings.  Follow-up echo.  I will start her on Eliquis twice a day  For DVT.  Her lipid panel shows a LDL of 71 triglycerides 139 HDL 38 cholesterol 137.  Hemoglobin A1c 6.2.  B12 level is 114.  Will start B12 supplementation.  Continue aspirin and statin.patient in sinus tach asymptomatic.check ekg,ct chest ro PE.continue ivf.lopressor iv.  I WENT OFF AT 7 PM.The results of CT CHEST WERE NOT called to me by radiology.

## 2017-05-04 NOTE — Progress Notes (Signed)
OT Cancellation Note  Patient Details Name: [name] [name] MRN: [id number] DOB: [birth year]   Cancelled Treatment:    Reason Eval/Treat Not Completed: Medical issues which prohibited therapy; Pt noted to have DVT in RLE as of this AM. Will follow up for OT eval as pt is medically ready and as schedule allows.  Lou Cal, OT Pager [phone] 05/04/2017   [name] 05/04/2017, 10:34 AM

## 2017-05-04 NOTE — Telephone Encounter (Signed)
Notified pt MD has placed referral will receive call back once appt has been set-up w/appt info.[name] KitchenJohny Chess

## 2017-05-04 NOTE — Progress Notes (Signed)
Notified Dr. Rodena Piety that I did not put the SCD this am because patient has DVT. She gave verbal order to d/c the order for SCD. Told her also that IVF order expired at this time. She gave verbal to reorder for another 10 hrs.

## 2017-05-04 NOTE — Progress Notes (Addendum)
STROKE TEAM PROGRESS NOTE   SUBJECTIVE (INTERVAL HISTORY) Her husband is at the bedside.  Overall she feels her condition is stable. However, she still has tachycardia and needed metoprolol injection. Found to have RLE DVT. Negative TCD bubble study, concerning for malignancy. Will need pan CT.    OBJECTIVE Temp:  [98.3 F (36.8 C)-99.6 F (37.6 C)] 98.8 F (37.1 C) (03/15 2100) Pulse Rate:  [102-124] 113 (03/15 2100) Cardiac Rhythm: Sinus tachycardia (03/15 2116) Resp:  [18-29] 18 (03/15 2100) BP: (102-141)/(58-80) 114/73 (03/15 2100) SpO2:  [91 %-97 %] 91 % (03/15 2100) Weight:  [175 lb 14.8 oz (79.8 kg)] 175 lb 14.8 oz (79.8 kg) (03/15 0300)  No results for input(s): GLUCAP in the last 168 hours. Recent Labs  Lab 05/03/17 2352 05/04/17 0000 05/04/17 0457  NA 133* 132* 131*  K 3.7 3.7 3.8  CL 100* 98* 101  CO2 22  --  20*  GLUCOSE 132* 128* 103*  BUN 15 15 14   CREATININE 1.63* 1.50* 1.48*  CALCIUM 8.4*  --  8.3*   Recent Labs  Lab 05/03/17 2352  AST 18  ALT 8*  ALKPHOS 54  BILITOT 0.6  PROT 6.2*  ALBUMIN 2.4*   Recent Labs  Lab 05/03/17 2352 05/04/17 0000 05/04/17 0457  WBC 12.2*  --  10.9*  NEUTROABS 10.0*  --   --   HGB 10.3* 10.9* 10.3*  HCT 30.8* 32.0* 31.0*  MCV 80.0  --  79.5  PLT 121*  --  121*   Recent Labs  Lab 05/04/17 0457 05/04/17 1010 05/04/17 1653  TROPONINI 0.12* 0.13* 0.15*   Recent Labs    05/03/17 2352  LABPROT 16.2*  INR 1.32   Recent Labs    05/04/17 0212  COLORURINE YELLOW  LABSPEC 1.009  PHURINE 6.0  GLUCOSEU NEGATIVE  HGBUR MODERATE*  BILIRUBINUR NEGATIVE  KETONESUR NEGATIVE  PROTEINUR 100*  NITRITE NEGATIVE  LEUKOCYTESUR LARGE*       Component Value Date/Time   CHOL 137 05/04/2017 0457   TRIG 139 05/04/2017 0457   HDL 38 (L) 05/04/2017 0457   CHOLHDL 3.6 05/04/2017 0457   VLDL 28 05/04/2017 0457   LDLCALC 71 05/04/2017 0457   Lab Results  Component Value Date   HGBA1C 6.2 (H) 05/04/2017       Component Value Date/Time   LABOPIA NONE DETECTED 05/04/2017 0212   COCAINSCRNUR NONE DETECTED 05/04/2017 0212   LABBENZ NONE DETECTED 05/04/2017 0212   AMPHETMU NONE DETECTED 05/04/2017 0212   THCU NONE DETECTED 05/04/2017 0212   LABBARB NONE DETECTED 05/04/2017 0212    Recent Labs  Lab 05/03/17 2352  ETH <10    I have personally reviewed the radiological images below and agree with the radiology interpretations.  Ct Angio Head W Or Wo Contrast  Result Date: 05/04/2017 CLINICAL DATA:  Stroke follow-up. Bilateral occipital infarcts on CT. Right-sided weakness. Blurry vision. Dizziness. EXAM: CT ANGIOGRAPHY HEAD AND NECK TECHNIQUE: Multidetector CT imaging of the head and neck was performed using the standard protocol during bolus administration of intravenous contrast. Multiplanar CT image reconstructions and MIPs were obtained to evaluate the vascular anatomy. Carotid stenosis measurements (when applicable) are obtained utilizing NASCET criteria, using the distal internal carotid diameter as the denominator. CONTRAST:  50 mL Isovue 370 COMPARISON:  Noncontrast head CT earlier today. No prior angiographic imaging. FINDINGS: CTA NECK FINDINGS Aortic arch: Normal variant aortic arch branching pattern with a common origin of the brachiocephalic and left common carotid arteries. Widely patent arch  vessel origins. Right carotid system: Widely patent common carotid artery. ICA occlusion at its origin without reconstitution in the neck. Patent ECA. Left carotid system: Patent without evidence of stenosis or dissection. Vertebral arteries: Patent and codominant without evidence of stenosis or dissection. Skeleton: Moderate disc degeneration throughout the cervical spine. Other neck: No mass or enlarged lymph nodes. Upper chest: Clear lung apices. Review of the MIP images confirms the above findings CTA HEAD FINDINGS Anterior circulation: The intracranial right ICA is occluded proximally with distal  reconstitution at the supraclinoid level via the posterior communicating artery. The intracranial left ICA is widely patent. ACAs and MCAs are patent with mild branch vessel irregularity but no evidence of proximal branch occlusion or significant stenosis. No aneurysm or vascular malformation. Posterior circulation: The intracranial vertebral arteries are widely patent to the basilar. Left PICA, bilateral AICA, and bilateral SCA origins are patent. Right PICA is not well seen. The basilar artery is widely patent. There are small to medium-sized right and diminutive left posterior communicating arteries. PCAs are patent without evidence of significant stenosis. No aneurysm or vascular malformation. Venous sinuses: Patent. Anatomic variants: None. Delayed phase: Left caudate lacunar infarct, more conspicuous than on the earlier CT though favored to be chronic given evidence of mild volume loss. No abnormal enhancement. Review of the MIP images confirms the above findings IMPRESSION: 1. 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 2. Otherwise patent major arterial vasculature in the circle of Willis and neck without evidence of significant stenosis. These results were communicated to Dr. Rosalin Hawking at 11:52 am on 05/04/2017 by text page via the [facility] messaging system. Electronically Signed   By: Logan Bores M.D.   On: 05/04/2017 11:53   Dg Chest 2 View  Result Date: 05/04/2017 CLINICAL DATA:  Acute onset of right-sided weakness. Dizziness. Tachycardia and orthostatic hypotension. EXAM: CHEST - 2 VIEW COMPARISON:  None. FINDINGS: There is mild elevation of the right hemidiaphragm. There is no evidence of focal opacification, pleural effusion or pneumothorax. The heart is normal in size; the mediastinal contour is within normal limits. No acute osseous abnormalities are seen. IMPRESSION: Mild elevation of the right hemidiaphragm. Lungs remain grossly clear. Electronically Signed   By: Garald Balding M.D.   On: 05/04/2017 01:25   Ct Head Wo Contrast  Result Date: 05/04/2017 CLINICAL DATA:  Right-sided weakness and dizziness EXAM: CT HEAD WITHOUT CONTRAST TECHNIQUE: Contiguous axial images were obtained from the base of the skull through the vertex without intravenous contrast. COMPARISON:  None. FINDINGS: Brain: Areas of focal hypoattenuation within both occipital lobes suggesting recent infarction. No hemorrhage or mass effect. Otherwise normal appearance of the brain parenchyma and extra axial spaces for age. Vascular: No hyperdense vessel or unexpected vascular calcification. Skull: Normal visualized skull base, calvarium and extracranial soft tissues. Sinuses/Orbits: No sinus fluid levels or advanced mucosal thickening. No mastoid effusion. Normal orbits. 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 MRI recommended. These results were called by telephone at the time of interpretation on 05/04/2017 at 1:23 am to Dr. Ezequiel Essex , who verbally acknowledged these results. Electronically Signed   By: Ulyses Jarred M.D.   On: 05/04/2017 01:23   Ct Angio Neck W Or Wo Contrast  Result Date: 05/04/2017 CLINICAL DATA:  Stroke follow-up. Bilateral occipital infarcts on CT. Right-sided weakness. Blurry vision. Dizziness. EXAM: CT ANGIOGRAPHY HEAD AND NECK TECHNIQUE: Multidetector CT imaging of the head and neck was performed using the  standard protocol during bolus administration of intravenous contrast. Multiplanar CT image reconstructions and MIPs were obtained to evaluate the vascular anatomy. Carotid stenosis measurements (when applicable) are obtained utilizing NASCET criteria, using the distal internal carotid diameter as the denominator. CONTRAST:  50 mL Isovue 370 COMPARISON:  Noncontrast head CT earlier today. No prior angiographic imaging. FINDINGS: CTA NECK FINDINGS Aortic arch: Normal variant aortic arch branching pattern with a  common origin of the brachiocephalic and left common carotid arteries. Widely patent arch vessel origins. Right carotid system: Widely patent common carotid artery. ICA occlusion at its origin without reconstitution in the neck. Patent ECA. Left carotid system: Patent without evidence of stenosis or dissection. Vertebral arteries: Patent and codominant without evidence of stenosis or dissection. Skeleton: Moderate disc degeneration throughout the cervical spine. Other neck: No mass or enlarged lymph nodes. Upper chest: Clear lung apices. Review of the MIP images confirms the above findings CTA HEAD FINDINGS Anterior circulation: The intracranial right ICA is occluded proximally with distal reconstitution at the supraclinoid level via the posterior communicating artery. The intracranial left ICA is widely patent. ACAs and MCAs are patent with mild branch vessel irregularity but no evidence of proximal branch occlusion or significant stenosis. No aneurysm or vascular malformation. Posterior circulation: The intracranial vertebral arteries are widely patent to the basilar. Left PICA, bilateral AICA, and bilateral SCA origins are patent. Right PICA is not well seen. The basilar artery is widely patent. There are small to medium-sized right and diminutive left posterior communicating arteries. PCAs are patent without evidence of significant stenosis. No aneurysm or vascular malformation. Venous sinuses: Patent. Anatomic variants: None. Delayed phase: Left caudate lacunar infarct, more conspicuous than on the earlier CT though favored to be chronic given evidence of mild volume loss. No abnormal enhancement. Review of the MIP images confirms the above findings IMPRESSION: 1. 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 2. Otherwise patent major arterial vasculature in the circle of Willis and neck without evidence of significant stenosis. These results were communicated to Dr. Rosalin Hawking at 11:52 am  on 05/04/2017 by text page via the [facility] messaging system. Electronically Signed   By: Logan Bores M.D.   On: 05/04/2017 11:53   Ct Angio Chest Pe W Or Wo Contrast  Result Date: 05/04/2017 CLINICAL DATA:  67 year old female with history of acute deep venous thrombosis today. EXAM: CT ANGIOGRAPHY CHEST WITH CONTRAST TECHNIQUE: Multidetector CT imaging of the chest was performed using the standard protocol during bolus administration of intravenous contrast. Multiplanar CT image reconstructions and MIPs were obtained to evaluate the vascular anatomy. CONTRAST:  87mL ISOVUE-370 IOPAMIDOL (ISOVUE-370) INJECTION 76% COMPARISON:  No priors. FINDINGS: Cardiovascular: In the distal left main pulmonary artery extending into lobar, segmental and subsegmental sized branches throughout the left lung there is a large filling defect, compatible with pulmonary embolism. This appears to be predominantly nonocclusive, and some of the filling defects are centrally located while others are more eccentric, suggesting a combination of both acute and chronic thrombus. In addition, in the distal right main pulmonary artery there is a small filling defect, compatible with additional embolism. Heart size is upper limits of normal, however, right ventricle appears dilated when compared with the (right ventricular diameter of 44 mm versus 38 mm on the left (RV to LV ratio of 1.16)). There is no significant pericardial fluid, thickening or pericardial calcification. Mediastinum/Nodes: No pathologically enlarged mediastinal or hilar lymph nodes. Esophagus is unremarkable in appearance. No axillary lymphadenopathy. Lungs/Pleura: Several scattered  small 2-5 mm pulmonary nodules are noted throughout the lungs bilaterally. Patchy areas of ground-glass attenuation in the lungs bilaterally interspersed with areas of lucency, which could reflect heterogeneous perfusion of the lungs in the setting of pulmonary embolism. No confluent consolidative  airspace disease. Trace bilateral pleural effusions lying dependently. Upper Abdomen: Diffuse low attenuation throughout the visualized hepatic parenchyma, indicative of hepatic steatosis. Large volume of ascites incompletely imaged. Nodularity throughout the omentum, concerning for intraperitoneal spread of malignancy. Left kidney is severely atrophic. Musculoskeletal: There are no aggressive appearing lytic or blastic lesions noted in the visualized portions of the skeleton. Review of the MIP images confirms the above findings. IMPRESSION: 1. Study is positive for a large burden of what appears to be both chronic thrombus an acute pulmonary embolism in the lungs bilaterally (left much greater than right), with CT evidence of right heart strain (RV/LV Ratio = 1.16) consistent with at least submassive (intermediate risk) PE. The presence of right heart strain has been associated with an increased risk of morbidity and mortality. Please activate Code PE by paging [phone]. 2. Multiple small pulmonary nodules scattered throughout the lungs bilaterally measuring 2-5 mm in size. These are nonspecific, but given the findings in the abdomen, these are concerning for potential metastatic lesions. Attention on follow-up studies is recommended. 3. Trace bilateral pleural effusions lying dependently. 4. Large volume of ascites in the abdomen is likely malignant given the apparent omental nodularity. Further evaluation with dedicated CT the abdomen and pelvis with IV contrast is recommended in the near future to evaluate for source of primary malignancy. Critical Value/emergent results were called by telephone at the time of interpretation on 05/04/2017 at 7:34 pm to nurse Jenny Reichmann for Dr. Landis Gandy, who verbally acknowledged these results. Electronically Signed   By: Vinnie Langton M.D.   On: 05/04/2017 19:35   Mr Brain Wo Contrast  Result Date: 05/04/2017 CLINICAL DATA:  67 y/o F; stroke follow-up. Right-sided  weakness and blurred vision. EXAM: MRI HEAD WITHOUT CONTRAST TECHNIQUE: Axial DWI, coronal DWI, sagittal T1 FLAIR, axial T2 FLAIR, and axial SWAN sequences were acquired. COMPARISON:  05/04/2017 CT of head and CTA of head. FINDINGS: Brain: Small regions of reduced diffusion in right posterior parietal and left occipital lobes compatible with acute/early subacute infarction. Additionally there are a few punctate foci with reduced diffusion on ADC in left frontal and right parietal lobes as well as left thalamus. There are numerous additional foci of hyperintensity scattered throughout the supratentorial and infratentorial brain with intermediate to increased diffusion on ADC compatible with late subacute to chronic infarctions. Curvilinear susceptibility hypointensity within the right posterior parietal acute infarction is compatible with petechial hemorrhage. No additional susceptibility hypointensity to indicate intracranial hemorrhage. No hydrocephalus or significant mass effect. Areas of infarction are associated with T2 FLAIR hyperintense signal abnormality and there is a background of moderate chronic microvascular ischemic changes of the brain. Vascular: Loss of flow void in right petrous and cavernous ICA with known occlusion. Skull and upper cervical spine: Normal marrow signal. Sinuses/Orbits: Negative. Other: None. IMPRESSION: 1. Small regions of acute/early subacute infarction within right posterior parietal and left occipital lobes stable from prior CT given differences in technique. Few additional punctate foci in left frontal, right parietal, and left thalamus. 2. Petechial hemorrhage associated with right posterior parietal infarction. 3. Multiple additional subcentimeter foci of late subacute to chronic infarction scattered throughout the supratentorial and infratentorial brain. 4. Background of moderate chronic microvascular ischemic changes of the brain. Electronically Signed  By: Kristine Garbe M.D.   On: 05/04/2017 21:05   LE venous doppler  Right: There is evidence of acute DVT in the Femoral vein, and Posterior Tibial veins. There is evidence of acute superficial thrombosis of the great saphenous vein. No cystic structure found in the popliteal fossa. Left: There is no evidence of deep vein thrombosis in the lower extremity.There is no evidence of superficial venous thrombosis. No cystic structure found in the popliteal fossa.  TCD bubble study Negative for PFO  TTE pending   PHYSICAL EXAM  Temp:  [98.3 F (36.8 C)-99.6 F (37.6 C)] 98.8 F (37.1 C) (03/15 2100) Pulse Rate:  [102-124] 113 (03/15 2100) Resp:  [18-29] 18 (03/15 2100) BP: (102-141)/(58-80) 114/73 (03/15 2100) SpO2:  [91 %-97 %] 91 % (03/15 2100) Weight:  [175 lb 14.8 oz (79.8 kg)] 175 lb 14.8 oz (79.8 kg) (03/15 0300)  General - Well nourished, well developed, in no apparent distress.  Ophthalmologic - fundi not visualized due to noncooperation.  Abdomen - extended abdomen, nontender, but semi-firm   Cardiovascular - Regular rhythm but tachycardia.  Mental Status -  Level of arousal and orientation to time, place, and person were intact. Language including expression, naming, repetition, comprehension was assessed and found intact. Fund of Knowledge was assessed and was intact  Cranial Nerves II - XII - II - Visual field intact OU, mild simultagnosia on the left. III, IV, VI - Extraocular movements intact. V - Facial sensation intact bilaterally. VII - Facial movement intact bilaterally. VIII - Hearing & vestibular intact bilaterally. X - Palate elevates symmetrically. XI - Chin turning & shoulder shrug intact bilaterally. XII - Tongue protrusion intact.  Motor Strength - The patient's strength was normal in all extremities and pronator drift was absent.  Bulk was normal and fasciculations were absent.   Motor Tone - Muscle tone was assessed at the neck and appendages and  was normal.  Reflexes - The patient's reflexes were symmetrical in all extremities and she had no pathological reflexes.  Sensory - Light touch, temperature/pinprick were assessed and were symmetrical.    Coordination - The patient had normal movements in the hands with no ataxia or dysmetria but left ocular apraxia.  Tremor was absent.  Gait and Station - deferred.   ASSESSMENT/PLAN [name] [name] is a 67 y.o. female with history of hypertension, hyperlipidemia, schizophrenia admitted for transient right-sided weakness, blurry vision and dizziness. No tPA given due to out of window.    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 Yes; Fluid consistency: Thin   aspirin 81 mg daily prior to admission, now on heparin IV.   Ongoing aggressive stroke risk factor management  Therapy recommendations:  Pending   Disposition:  Pending   DVT and PE  LE venous doppler showed RLE DVT  CTA chest showed acute and chronic PE with sub-massive PE  On heparin IV  Likely due to hypercoagulable state secondary to advance maligancy  Abdominal and lung metastatic cancer  CTA chest showed multiple lung nodules  CTA upper abdomen showed large volume ascites, omental nodularity  Likely the cause of hypercoagulable state  On haparin IV  Recommend pan CT for further evaluation of malignancy  Oncology consult    Hypertension Stable Permissive hypertension (OK if <180/105) for 24-48 hours post stroke and then gradually normalized within 5-7 days.  Long term BP goal normotensive  Hyperlipidemia  Home meds:  lovastatin   LDL 71, goal < 70  Now on pravastatin  Other Stroke Risk Factors  Advanced age  Other Active Problems  Schizophrenia   AKI Cre 1.63->1.50->1.48  Hospital day # 0    Rosalin Hawking, MD PhD Stroke Neurology 05/04/2017 10:53 PM    To contact Stroke Continuity provider, please refer to [email or web address] After hours, contact General Neurology

## 2017-05-04 NOTE — Progress Notes (Signed)
Called and notified Dr. Rodena Piety that patient is positive for DVT on RLE per Vascular Tech, Marya Amsler

## 2017-05-04 NOTE — Progress Notes (Signed)
Stroke sheet started on patient will pass on in report.

## 2017-05-04 NOTE — ED Notes (Signed)
purwick in place at this time. 

## 2017-05-04 NOTE — H&P (Signed)
History and Physical    BAMBIE PIZZOLATO NID:[id number] DOB: [birth year] DOA: 05/03/2017  PCP: Biagio Borg, MD   Patient coming from: Home  Chief Complaint: Blurred vision, weakness, loss of appetite   HPI: [name] [name] is a 67 y.o. female with medical history significant for schizophrenia, coronary artery disease, and hypertension, now presenting to the emergency department for evaluation of weakness, malaise, blurred vision, and loss of appetite.  Patient reports that she been in her usual state of health until approximately 3-4 days ago when she developed a nonspecific malaise and loss of appetite.  Then, approximately 2 days ago, she noted weakness, particularly on her right side, as well as blurred vision.  She denies fevers, chills, chest pain, palpitations, cough, or shortness of breath.  No abdominal pain, but mild lower abdominal discomfort.   ED Course: Upon arrival to the ED, patient is found to be afebrile, saturating well on room air, tachycardic to 120, and normal blood pressure.  EKG features a sinus or ectopic atrial tachycardia with rate 109.  Chest x-ray is notable for mild elevation of the right hemidiaphragm, but with lungs clear.  Noncontrast head CT reveals a small bilateral occipital acute to early subacute occipital infarct without hemorrhage or mass-effect.  Chemistry panel reveals a sodium of 133, albumin 2.4, and creatinine of 1.63, up from priors in the 1-1.1 range.  CBC is notable for leukocytosis to 12,200, mild thrombocytopenia, and hemoglobin of 10.3, down from 14.1 last August.  Fecal occult blood testing was negative.  INR is slightly elevated.  Troponin is elevated to 0.15.  UDS is negative and ethanol is undetectable.  Urine is compatible with infection.  Neurology was consulted by the ED physician, type and screen was performed, patient was treated with 1.5 L of normal saline, remained slightly tachycardic but otherwise stable, and will be observed on the telemetry  unit for ongoing evaluation and management of ischemic CVA.  Review of Systems:  All other systems reviewed and apart from HPI, are negative.  Past Medical History:  Diagnosis Date  . CTS (carpal tunnel syndrome)    bilateral  . FATIGUE 08/27/2008  . GLUCOSE INTOLERANCE 06/25/2008  . HYPERLIPIDEMIA 06/25/2008  . HYPERTENSION 06/25/2008  . Impaired glucose tolerance 06/12/2010  . SCHIZOPHRENIA 06/25/2008    Past Surgical History:  Procedure Laterality Date  . DILATION AND CURETTAGE OF UTERUS  1999   hx of after miscarriage  . s/p cervical procedure  1995   LEEP     reports that she has quit smoking. She quit smokeless tobacco use about 41 years ago. She reports that she does not drink alcohol. Her drug history is not on file.  Allergies  Allergen Reactions  . Nabumetone     REACTION: rash    Family History  Problem Relation Age of Onset  . Hypertension Other   . Stroke Other      Prior to Admission medications   Medication Sig Start Date End Date Taking? Authorizing Provider  aspirin 81 MG EC tablet Take 81 mg by mouth daily.     Yes [provider]  benztropine (COGENTIN) 2 MG tablet Take 1 tablet (2 mg total) by mouth daily. 06/15/10  Yes Biagio Borg, MD  colchicine 0.6 MG tablet 1 tab by mouth as needed for gout pain and swelling with repeat 1 per hour until improved or diarrhea 05/01/17  Yes Biagio Borg, MD  escitalopram (LEXAPRO) 5 MG tablet Take 5 mg by mouth  daily.   Yes [provider]  haloperidol (HALDOL) 10 MG tablet Take 15 mg by mouth daily. Take 1 1/2 tab by mouth daily   Yes [provider]  lisinopril-hydrochlorothiazide (PRINZIDE,ZESTORETIC) 20-12.5 MG tablet Take 2 tablets by mouth daily. 09/26/16  Yes Biagio Borg, MD  lovastatin (MEVACOR) 40 MG tablet Take 1 tablet (40 mg total) by mouth daily. 09/26/16  Yes Biagio Borg, MD  potassium chloride (K-DUR) 10 MEQ tablet Take 1 tablet (10 mEq total) by mouth daily. 04/16/17  Yes Biagio Borg, MD    Physical Exam: Vitals:   05/04/17 0130 05/04/17 0200 05/04/17 0230 05/04/17 0300  BP: 114/74 130/77 129/80 (!) 141/80  Pulse: (!) 118 (!) 112 (!) 102 (!) 117  Resp: (!) 26 (!) 29 20 20   Temp:    98.6 F (37 C)  TempSrc:    Oral  SpO2: 93% 94% 96% 94%  Weight:    79.8 kg (175 lb 14.8 oz)  Height:    5\' 8"  (1.727 m)      Constitutional: NAD, calm  Eyes: PERTLA, lids and conjunctivae normal ENMT: Mucous membranes are moist. Posterior pharynx clear of any exudate or lesions.   Neck: normal, supple, no masses, no thyromegaly Respiratory: clear to auscultation bilaterally, no wheezing. No accessory muscle use.  Cardiovascular: Rate ~120 and regular. No significant JVD. Abdomen: No distension, no tenderness, no masses palpated. Bowel sounds normal.  Musculoskeletal: no clubbing / cyanosis. No joint deformity upper and lower extremities.   Skin: no significant rashes, lesions, ulcers. Warm, dry, well-perfused. Neurologic: No gross facial asymmetry. Mild dysarthria. Sensation to light touch intact. Mild global weakness.  Psychiatric: Alert and oriented x 3. Pleasant and cooperative.     Labs on Admission: I have personally reviewed following labs and imaging studies  CBC: Recent Labs  Lab 05/03/17 2352 05/04/17 0000  WBC 12.2*  --   NEUTROABS 10.0*  --   HGB 10.3* 10.9*  HCT 30.8* 32.0*  MCV 80.0  --   PLT 121*  --    Basic Metabolic Panel: Recent Labs  Lab 05/03/17 2352 05/04/17 0000  NA 133* 132*  K 3.7 3.7  CL 100* 98*  CO2 22  --   GLUCOSE 132* 128*  BUN 15 15  CREATININE 1.63* 1.50*  CALCIUM 8.4*  --    GFR: Estimated Creatinine Clearance: 42 mL/min (A) (by C-G formula based on SCr of 1.5 mg/dL (H)). Liver Function Tests: Recent Labs  Lab 05/03/17 2352  AST 18  ALT 8*  ALKPHOS 54  BILITOT 0.6  PROT 6.2*  ALBUMIN 2.4*   No results for input(s): LIPASE, AMYLASE in the last 168 hours. No results for input(s): AMMONIA in the last 168  hours. Coagulation Profile: Recent Labs  Lab 05/03/17 2352  INR 1.32   Cardiac Enzymes: No results for input(s): CKTOTAL, CKMB, CKMBINDEX, TROPONINI in the last 168 hours. BNP (last 3 results) No results for input(s): PROBNP in the last 8760 hours. HbA1C: No results for input(s): HGBA1C in the last 72 hours. CBG: No results for input(s): GLUCAP in the last 168 hours. Lipid Profile: No results for input(s): CHOL, HDL, LDLCALC, TRIG, CHOLHDL, LDLDIRECT in the last 72 hours. Thyroid Function Tests: No results for input(s): TSH, T4TOTAL, FREET4, T3FREE, THYROIDAB in the last 72 hours. Anemia Panel: No results for input(s): VITAMINB12, FOLATE, FERRITIN, TIBC, IRON, RETICCTPCT in the last 72 hours. Urine analysis:    Component Value Date/Time   COLORURINE YELLOW  05/04/2017 0212   APPEARANCEUR HAZY (A) 05/04/2017 0212   LABSPEC 1.009 05/04/2017 0212   PHURINE 6.0 05/04/2017 0212   GLUCOSEU NEGATIVE 05/04/2017 0212   GLUCOSEU NEGATIVE 09/26/2016 1658   HGBUR MODERATE (A) 05/04/2017 0212   BILIRUBINUR NEGATIVE 05/04/2017 0212   KETONESUR NEGATIVE 05/04/2017 0212   PROTEINUR 100 (A) 05/04/2017 0212   UROBILINOGEN 1.0 09/26/2016 1658   NITRITE NEGATIVE 05/04/2017 0212   LEUKOCYTESUR LARGE (A) 05/04/2017 0212   Sepsis Labs: @LABRCNTIP (procalcitonin:4,lacticidven:4) )No results found for this or any previous visit (from the past 240 hour(s)).   Radiological Exams on Admission: Dg Chest 2 View  Result Date: 05/04/2017 CLINICAL DATA:  Acute onset of right-sided weakness. Dizziness. Tachycardia and orthostatic hypotension. EXAM: CHEST - 2 VIEW COMPARISON:  None. FINDINGS: There is mild elevation of the right hemidiaphragm. There is no evidence of focal opacification, pleural effusion or pneumothorax. The heart is normal in size; the mediastinal contour is within normal limits. No acute osseous abnormalities are seen. IMPRESSION: Mild elevation of the right hemidiaphragm. Lungs remain  grossly clear. Electronically Signed   By: Garald Balding M.D.   On: 05/04/2017 01:25   Ct Head Wo Contrast  Result Date: 05/04/2017 CLINICAL DATA:  Right-sided weakness and dizziness EXAM: CT HEAD WITHOUT CONTRAST TECHNIQUE: Contiguous axial images were obtained from the base of the skull through the vertex without intravenous contrast. COMPARISON:  None. FINDINGS: Brain: Areas of focal hypoattenuation within both occipital lobes suggesting recent infarction. No hemorrhage or mass effect. Otherwise normal appearance of the brain parenchyma and extra axial spaces for age. Vascular: No hyperdense vessel or unexpected vascular calcification. Skull: Normal visualized skull base, calvarium and extracranial soft tissues. Sinuses/Orbits: No sinus fluid levels or advanced mucosal thickening. No mastoid effusion. Normal orbits. 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 MRI recommended. These results were called by telephone at the time of interpretation on 05/04/2017 at 1:23 am to Dr. Ezequiel Essex , who verbally acknowledged these results. Electronically Signed   By: Ulyses Jarred M.D.   On: 05/04/2017 01:23    EKG: Independently reviewed. Sinus or ectopic atrial tachycardia (rate 109).   Assessment/Plan  1. Ischemic CVA  - Presents with 2 days of blurred vision, malaise, and weakness that is worse on right  - CT head reveals acute or early subacute infarcts in bilateral occipital lobes without mass-effect or hemorrhage   - Neurology is consulting and much appreciated  - Check MRI brain, MRA head & neck, echocardiogram, fasting lipids, A1c  - Continue cardiac monitoring, frequent neuro checks, PT/OT/SLP evals  - Continue ASA, statin    2. Acute kidney injury  - SCr is 1.63 on admission, previously 1-1.1  - Likely prerenal azotemia in setting of loss of appetite for several days  - Treated in ED with 1.5 liters NS and continued on IVF  -  Renally-dose medications, avoid nephrotoxins, repeat chem panel in am    3. UTI  - No dysuria, but has lower abdominal discomfort and malaise  - UA compatible with infection - Plan to send urine for culture and start Rocephin   4. Schizophrenia  - Stable; denies SI, HI, or hallucinations  - Continue Haldol with cogentin, Lexapro    5. Anemia; thrombocytopenia  - Hgb is 10.3 on admission, down from 14.1 last August  - Platelets 121,000 on admission, previously normal  - No bleeding identified; FOBT negative in ED   - Check anemia panel  6. Hypertension  - BP at goal  - Hold lisinopril-HCTZ for now pending vascular imaging   7. Hyponatremia  - Serum sodium is 133 in setting of hypovolemia  - Treated in ED with 1.5 liters NS and continued on NS infusion  - Repeat chem panel in am   8. Tachyarrhythmia  - Presents with sinus or ectopic atrial tachycardia, rate 110-120  - Appears to be sinus on monitor but continues to have HR in 110-120 range despite IVF  - Plan to confirm she received the IVF as no bag hanging, repeat EKG   9. Elevated troponin - Troponin elevated to 0.15 on admission without angina  - Continue cardiac monitoring, trend troponin, repeat EKG, continue ASA and statin    DVT prophylaxis: SCD's  Code Status: Full  Family Communication: Husband updated at bedside Consults called: Neurology Admission status: Observation    Vianne Bulls, MD [facility] Pager [phone]  If 7PM-7AM, please contact night-coverage [email or web address] Password [email or web address]  05/04/2017, 4:43 AM

## 2017-05-04 NOTE — Progress Notes (Signed)
Accompanied patient to CT scan earlier from 11:00 to 11:45H. No apparent distress noted.

## 2017-05-04 NOTE — Progress Notes (Signed)
Transcranial bubble study completed. Dr. Erlinda Hong performed. No HITS at rest or during Valsalva. No apparent PFO. Oda Cogan, BS, RDMS, RVT

## 2017-05-04 NOTE — Consult Note (Signed)
Reason for Consult:Troponin elevation Referring Physician: Jacki Cones, MD  [name] [name] is an 67 y.o. female.   HPI:   67 y/o female with hypertension, Hyperlipidemia, schizophrenia, presented to [facility] emergency department on 05/04/2017 with complaints of generalized weakness, blurred vision. Workup showed noncontrast head CT with small bilateral occipital acute to early subacute infarcts without hemorrhage or mass effect.  CT angiogram showed completely occluded proximal right ICA, with reconstitution and distal ICA. Reportedly, transcranial ultrasound with bubble study did not show any shunts. Vascular ultrasound of lower extremity showed acute DVT and femoral vein, posterior tibial vein, and acute superficial thrombosis of great saphenous vein. Troponin mildly elevated at 0.15 ng/mL without any rise and fall suggesting myocardial infarction. We were consulted for the troponin elevation.   Talking to patient and her husband, it appears that patient has been having bilateral leg swelling and pain for last 5 days. This was followed by generalized weakness, focal right foot weakness, and blurry vision starting Thursday. Prior to this, patient has been significantly limited in her functional ability, perhaps due to her underlying psychiatric illness, for last several days. Her only physical activity includes basic minimal hygiene. She denies any chest pain. She does endorse shortness of breath, not necessarily related to exertion. She's not had any lightheadedness, presyncopal or syncopal episodes. Since she's been in the hospital, she says that her vision and weakness is improving.  On a separate note, she reports fibroids and vaginal bleeding, last episode of week ago. Noticeably, her hemoglobin and platelet count is much lower than her baseline. She has had acute kidney injury this hospital admission.     Past Medical History:  Diagnosis Date  . CTS (carpal tunnel syndrome)     bilateral  . FATIGUE 08/27/2008  . GLUCOSE INTOLERANCE 06/25/2008  . HYPERLIPIDEMIA 06/25/2008  . HYPERTENSION 06/25/2008  . Impaired glucose tolerance 06/12/2010  . SCHIZOPHRENIA 06/25/2008    Past Surgical History:  Procedure Laterality Date  . DILATION AND CURETTAGE OF UTERUS  1999   hx of after miscarriage  . s/p cervical procedure  1995   LEEP    Family History  Problem Relation Age of Onset  . Hypertension Other   . Stroke Other     Social History:  reports that she has quit smoking. She quit smokeless tobacco use about 41 years ago. She reports that she does not drink alcohol. Her drug history is not on file.  Allergies:  Allergies  Allergen Reactions  . Nabumetone Rash    Medications: I have reviewed the patient's current medications.  Results for orders placed or performed during the hospital encounter of 05/03/17 (from the past 48 hour(s))  Ethanol     Status: None   Collection Time: 05/03/17 11:52 PM  Result Value Ref Range   Alcohol, Ethyl (B) <10 <10 mg/dL    Comment:        LOWEST DETECTABLE LIMIT FOR SERUM ALCOHOL IS 10 mg/dL FOR MEDICAL PURPOSES ONLY Performed at [facility] [address] [place], [place] [address]   Protime-INR     Status: Abnormal   Collection Time: 05/03/17 11:52 PM  Result Value Ref Range   Prothrombin Time 16.2 (H) 11.4 - 15.2 seconds   INR 1.32     Comment: Performed at [facility] [address], [place], [place] [address]  APTT     Status: None   Collection Time: 05/03/17 11:52 PM  Result Value Ref Range   aPTT  32 24 - 36 seconds    Comment: Performed at [facility], [place] [address] [place], [place] [address]  CBC     Status: Abnormal   Collection Time: 05/03/17 11:52 PM  Result Value Ref Range   WBC 12.2 (H) 4.0 - 10.5 K/uL   RBC 3.85 (L) 3.87 - 5.11 MIL/uL   Hemoglobin 10.3 (L) 12.0 - 15.0 g/dL   HCT 30.8 (L) 36.0 - 46.0 %   MCV 80.0 78.0 - 100.0 fL   MCH 26.8 26.0 - 34.0 pg   MCHC 33.4 30.0 -  36.0 g/dL   RDW 15.9 (H) 11.5 - 15.5 %   Platelets 121 (L) 150 - 400 K/uL    Comment: Performed at [facility], [place] [address], [place], [place] [address]  Differential     Status: Abnormal   Collection Time: 05/03/17 11:52 PM  Result Value Ref Range   Neutrophils Relative % 82 %   Neutro Abs 10.0 (H) 1.7 - 7.7 K/uL   Lymphocytes Relative 8 %   Lymphs Abs 1.0 0.7 - 4.0 K/uL   Monocytes Relative 9 %   Monocytes Absolute 1.1 (H) 0.1 - 1.0 K/uL   Eosinophils Relative 1 %   Eosinophils Absolute 0.1 0.0 - 0.7 K/uL   Basophils Relative 0 %   Basophils Absolute 0.0 0.0 - 0.1 K/uL    Comment: Performed at [facility] [address], [place], [place] [address]  Comprehensive metabolic panel     Status: Abnormal   Collection Time: 05/03/17 11:52 PM  Result Value Ref Range   Sodium 133 (L) 135 - 145 mmol/L   Potassium 3.7 3.5 - 5.1 mmol/L   Chloride 100 (L) 101 - 111 mmol/L   CO2 22 22 - 32 mmol/L   Glucose, Bld 132 (H) 65 - 99 mg/dL   BUN 15 6 - 20 mg/dL   Creatinine, Ser 1.63 (H) 0.44 - 1.00 mg/dL   Calcium 8.4 (L) 8.9 - 10.3 mg/dL   Total Protein 6.2 (L) 6.5 - 8.1 g/dL   Albumin 2.4 (L) 3.5 - 5.0 g/dL   AST 18 15 - 41 U/L   ALT 8 (L) 14 - 54 U/L   Alkaline Phosphatase 54 38 - 126 U/L   Total Bilirubin 0.6 0.3 - 1.2 mg/dL   GFR calc non Af Amer 32 (L) >60 mL/min   GFR calc Af Amer 37 (L) >60 mL/min    Comment: (NOTE) The eGFR has been calculated using the CKD EPI equation. This calculation has not been validated in all clinical situations. eGFR's persistently <60 mL/min signify possible Chronic Kidney Disease.    Anion gap 11 5 - 15    Comment: Performed at [facility] [address], [place], [place] [address]  I-stat troponin, ED     Status: Abnormal   Collection Time: 05/03/17 11:57 PM  Result Value Ref Range   Troponin i, poc 0.15 (HH) 0.00 - 0.08 ng/mL   Comment NOTIFIED PHYSICIAN    Comment 3            Comment: Due to the release kinetics of  cTnI, a negative result within the first hours of the onset of symptoms does not rule out myocardial infarction with certainty. If myocardial infarction is still suspected, repeat the test at appropriate intervals.   I-Stat Chem 8, ED     Status: Abnormal   Collection Time: 05/04/17 12:00 AM  Result Value Ref Range  Sodium 132 (L) 135 - 145 mmol/L   Potassium 3.7 3.5 - 5.1 mmol/L   Chloride 98 (L) 101 - 111 mmol/L   BUN 15 6 - 20 mg/dL   Creatinine, Ser 1.50 (H) 0.44 - 1.00 mg/dL   Glucose, Bld 128 (H) 65 - 99 mg/dL   Calcium, Ion 1.14 (L) 1.15 - 1.40 mmol/L   TCO2 22 22 - 32 mmol/L   Hemoglobin 10.9 (L) 12.0 - 15.0 g/dL   HCT 32.0 (L) 36.0 - 46.0 %  POC occult blood, ED Provider will collect     Status: None   Collection Time: 05/04/17 12:43 AM  Result Value Ref Range   Fecal Occult Bld NEGATIVE NEGATIVE  Type and screen [place]     Status: None   Collection Time: 05/04/17  1:30 AM  Result Value Ref Range   ABO/RH(D) O POS    Antibody Screen NEG    Sample Expiration      05/07/2017 Performed at [facility], [place] [address], [place], [place] [address]   ABO/Rh     Status: None   Collection Time: 05/04/17  1:30 AM  Result Value Ref Range   ABO/RH(D)      O POS Performed at [facility] [address], [place], [place] [address]   Urine rapid drug screen (hosp performed)     Status: None   Collection Time: 05/04/17  2:12 AM  Result Value Ref Range   Opiates NONE DETECTED NONE DETECTED   Cocaine NONE DETECTED NONE DETECTED   Benzodiazepines NONE DETECTED NONE DETECTED   Amphetamines NONE DETECTED NONE DETECTED   Tetrahydrocannabinol NONE DETECTED NONE DETECTED   Barbiturates NONE DETECTED NONE DETECTED    Comment: (NOTE) DRUG SCREEN FOR MEDICAL PURPOSES ONLY.  IF CONFIRMATION IS NEEDED FOR ANY PURPOSE, NOTIFY LAB WITHIN 5 DAYS. LOWEST DETECTABLE LIMITS FOR URINE DRUG SCREEN Drug Class                     Cutoff  (ng/mL) Amphetamine and metabolites    1000 Barbiturate and metabolites    200 Benzodiazepine                 465 Tricyclics and metabolites     300 Opiates and metabolites        300 Cocaine and metabolites        300 THC                            50 Performed at [facility], [place] [address] [place], [place] [address]   Urinalysis, Routine w reflex microscopic     Status: Abnormal   Collection Time: 05/04/17  2:12 AM  Result Value Ref Range   Color, Urine YELLOW YELLOW   APPearance HAZY (A) CLEAR   Specific Gravity, Urine 1.009 1.005 - 1.030   pH 6.0 5.0 - 8.0   Glucose, UA NEGATIVE NEGATIVE mg/dL   Hgb urine dipstick MODERATE (A) NEGATIVE   Bilirubin Urine NEGATIVE NEGATIVE   Ketones, ur NEGATIVE NEGATIVE mg/dL   Protein, ur 100 (A) NEGATIVE mg/dL   Nitrite NEGATIVE NEGATIVE   Leukocytes, UA LARGE (A) NEGATIVE   RBC / HPF 6-30 0 - 5 RBC/hpf   WBC, UA TOO NUMEROUS TO COUNT 0 - 5 WBC/hpf   Bacteria, UA RARE (A) NONE SEEN   Squamous Epithelial / LPF 0-5 (A) NONE SEEN  Mucus PRESENT     Comment: Performed at [facility], [place] [address], [place], [place] [address]  Troponin I (q 6hr x 3)     Status: Abnormal   Collection Time: 05/04/17  4:57 AM  Result Value Ref Range   Troponin I 0.12 (HH) <0.03 ng/mL    Comment: CRITICAL RESULT CALLED TO, READ BACK BY AND VERIFIED WITH: Titus Dubin J,RN 05/04/17 0350 WAYK Performed at [facility] [address] [place], [place] [address]   Hemoglobin A1c     Status: Abnormal   Collection Time: 05/04/17  4:57 AM  Result Value Ref Range   Hgb A1c MFr Bld 6.2 (H) 4.8 - 5.6 %    Comment: (NOTE) Pre diabetes:          5.7%-6.4% Diabetes:              >6.4% Glycemic control for   <7.0% adults with diabetes    Mean Plasma Glucose 131.24 mg/dL    Comment: Performed at [facility] [address], [place], [place] [address]  Lipid panel     Status: Abnormal   Collection Time: 05/04/17  4:57 AM  Result Value Ref  Range   Cholesterol 137 0 - 200 mg/dL   Triglycerides 139 <150 mg/dL   HDL 38 (L) >40 mg/dL   Total CHOL/HDL Ratio 3.6 RATIO   VLDL 28 0 - 40 mg/dL   LDL Cholesterol 71 0 - 99 mg/dL    Comment:        Total Cholesterol/HDL:CHD Risk Coronary Heart Disease Risk Table                     Men   Women  1/2 Average Risk   3.4   3.3  Average Risk       5.0   4.4  2 X Average Risk   9.6   7.1  3 X Average Risk  23.4   11.0        Use the calculated Patient Ratio above and the CHD Risk Table to determine the patient's CHD Risk.        ATP III CLASSIFICATION (LDL):  <100     mg/dL   Optimal  100-129  mg/dL   Near or Above                    Optimal  130-159  mg/dL   Borderline  160-189  mg/dL   High  >190     mg/dL   Very High Performed at [facility] [address] [place], [place] [address]   Basic metabolic panel     Status: Abnormal   Collection Time: 05/04/17  4:57 AM  Result Value Ref Range   Sodium 131 (L) 135 - 145 mmol/L   Potassium 3.8 3.5 - 5.1 mmol/L   Chloride 101 101 - 111 mmol/L   CO2 20 (L) 22 - 32 mmol/L   Glucose, Bld 103 (H) 65 - 99 mg/dL   BUN 14 6 - 20 mg/dL   Creatinine, Ser 1.48 (H) 0.44 - 1.00 mg/dL   Calcium 8.3 (L) 8.9 - 10.3 mg/dL   GFR calc non Af Amer 36 (L) >60 mL/min   GFR calc Af Amer 42 (L) >60 mL/min    Comment: (NOTE) The eGFR has been calculated using the CKD EPI equation. This calculation has not been validated in all clinical situations. eGFR's persistently <60 mL/min signify  possible Chronic Kidney Disease.    Anion gap 10 5 - 15    Comment: Performed at [facility] [address], [place], [place] [address]  CBC     Status: Abnormal   Collection Time: 05/04/17  4:57 AM  Result Value Ref Range   WBC 10.9 (H) 4.0 - 10.5 K/uL   RBC 3.90 3.87 - 5.11 MIL/uL   Hemoglobin 10.3 (L) 12.0 - 15.0 g/dL   HCT 31.0 (L) 36.0 - 46.0 %   MCV 79.5 78.0 - 100.0 fL   MCH 26.4 26.0 - 34.0 pg   MCHC 33.2 30.0 - 36.0 g/dL   RDW 15.9  (H) 11.5 - 15.5 %   Platelets 121 (L) 150 - 400 K/uL    Comment: Performed at [facility], [place] [address], [place], [place] [address]  Vitamin B12     Status: Abnormal   Collection Time: 05/04/17  4:57 AM  Result Value Ref Range   Vitamin B-12 114 (L) 180 - 914 pg/mL    Comment: (NOTE) This assay is not validated for testing neonatal or myeloproliferative syndrome specimens for Vitamin B12 levels. Performed at [facility], [place] [address], [place], [place] [address]   Folate     Status: Abnormal   Collection Time: 05/04/17  4:57 AM  Result Value Ref Range   Folate 4.4 (L) >5.9 ng/mL    Comment: Performed at [facility], [place] [address] [place], [place] [address]  Iron and TIBC     Status: Abnormal   Collection Time: 05/04/17  4:57 AM  Result Value Ref Range   Iron 16 (L) 28 - 170 ug/dL   TIBC 167 (L) 250 - 450 ug/dL   Saturation Ratios 10 (L) 10.4 - 31.8 %   UIBC 151 ug/dL    Comment: Performed at [facility], [place] [address] [place], [place] [address]  Ferritin     Status: Abnormal   Collection Time: 05/04/17  4:57 AM  Result Value Ref Range   Ferritin 528 (H) 11 - 307 ng/mL    Comment: Performed at [facility], [place] [address], [place], [place] [address]  Reticulocytes     Status: None   Collection Time: 05/04/17  4:57 AM  Result Value Ref Range   Retic Ct Pct 1.3 0.4 - 3.1 %   RBC. 3.90 3.87 - 5.11 MIL/uL   Retic Count, Absolute 50.7 19.0 - 186.0 K/uL    Comment: Performed at [facility] [address], [place], [place] [address]  Troponin I (q 6hr x 3)     Status: Abnormal   Collection Time: 05/04/17 10:10 AM  Result Value Ref Range   Troponin I 0.13 (HH) <0.03 ng/mL    Comment: CRITICAL VALUE NOTED.  VALUE IS CONSISTENT WITH PREVIOUSLY REPORTED AND CALLED VALUE. Performed at [facility], [place] [address] [place],  [address]     Ct Angio Head W Or Wo Contrast  Result Date: 05/04/2017 CLINICAL DATA:  Stroke  follow-up. Bilateral occipital infarcts on CT. Right-sided weakness. Blurry vision. Dizziness. EXAM: CT ANGIOGRAPHY HEAD AND NECK TECHNIQUE: Multidetector CT imaging of the head and neck was performed using the standard protocol during bolus administration of intravenous contrast. Multiplanar CT image reconstructions and MIPs were obtained to evaluate the vascular anatomy. Carotid stenosis measurements (when applicable) are obtained utilizing NASCET criteria, using the distal internal carotid diameter as the denominator. CONTRAST:  50  mL Isovue 370 COMPARISON:  Noncontrast head CT earlier today. No prior angiographic imaging. FINDINGS: CTA NECK FINDINGS Aortic arch: Normal variant aortic arch branching pattern with a common origin of the brachiocephalic and left common carotid arteries. Widely patent arch vessel origins. Right carotid system: Widely patent common carotid artery. ICA occlusion at its origin without reconstitution in the neck. Patent ECA. Left carotid system: Patent without evidence of stenosis or dissection. Vertebral arteries: Patent and codominant without evidence of stenosis or dissection. Skeleton: Moderate disc degeneration throughout the cervical spine. Other neck: No mass or enlarged lymph nodes. Upper chest: Clear lung apices. Review of the MIP images confirms the above findings CTA HEAD FINDINGS Anterior circulation: The intracranial right ICA is occluded proximally with distal reconstitution at the supraclinoid level via the posterior communicating artery. The intracranial left ICA is widely patent. ACAs and MCAs are patent with mild branch vessel irregularity but no evidence of proximal branch occlusion or significant stenosis. No aneurysm or vascular malformation. Posterior circulation: The intracranial vertebral arteries are widely patent to the basilar. Left PICA, bilateral AICA, and bilateral SCA origins are patent. Right PICA is not well seen. The basilar artery is widely patent.  There are small to medium-sized right and diminutive left posterior communicating arteries. PCAs are patent without evidence of significant stenosis. No aneurysm or vascular malformation. Venous sinuses: Patent. Anatomic variants: None. Delayed phase: Left caudate lacunar infarct, more conspicuous than on the earlier CT though favored to be chronic given evidence of mild volume loss. No abnormal enhancement. Review of the MIP images confirms the above findings IMPRESSION: 1. 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 2. Otherwise patent major arterial vasculature in the circle of Willis and neck without evidence of significant stenosis. These results were communicated to Dr. Rosalin Hawking at 11:52 am on 05/04/2017 by text page via the [facility] messaging system. Electronically Signed   By: Logan Bores M.D.   On: 05/04/2017 11:53   Dg Chest 2 View  Result Date: 05/04/2017 CLINICAL DATA:  Acute onset of right-sided weakness. Dizziness. Tachycardia and orthostatic hypotension. EXAM: CHEST - 2 VIEW COMPARISON:  None. FINDINGS: There is mild elevation of the right hemidiaphragm. There is no evidence of focal opacification, pleural effusion or pneumothorax. The heart is normal in size; the mediastinal contour is within normal limits. No acute osseous abnormalities are seen. IMPRESSION: Mild elevation of the right hemidiaphragm. Lungs remain grossly clear. Electronically Signed   By: Garald Balding M.D.   On: 05/04/2017 01:25   Ct Head Wo Contrast  Result Date: 05/04/2017 CLINICAL DATA:  Right-sided weakness and dizziness EXAM: CT HEAD WITHOUT CONTRAST TECHNIQUE: Contiguous axial images were obtained from the base of the skull through the vertex without intravenous contrast. COMPARISON:  None. FINDINGS: Brain: Areas of focal hypoattenuation within both occipital lobes suggesting recent infarction. No hemorrhage or mass effect. Otherwise normal appearance of the brain parenchyma and extra axial  spaces for age. Vascular: No hyperdense vessel or unexpected vascular calcification. Skull: Normal visualized skull base, calvarium and extracranial soft tissues. Sinuses/Orbits: No sinus fluid levels or advanced mucosal thickening. No mastoid effusion. Normal orbits. 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 MRI recommended. These results were called by telephone at the time of interpretation on 05/04/2017 at 1:23 am to Dr. Ezequiel Essex , who verbally acknowledged these results. Electronically Signed   By: Ulyses Jarred M.D.   On: 05/04/2017 01:23   Ct Angio Neck W  Or Wo Contrast  Result Date: 05/04/2017 CLINICAL DATA:  Stroke follow-up. Bilateral occipital infarcts on CT. Right-sided weakness. Blurry vision. Dizziness. EXAM: CT ANGIOGRAPHY HEAD AND NECK TECHNIQUE: Multidetector CT imaging of the head and neck was performed using the standard protocol during bolus administration of intravenous contrast. Multiplanar CT image reconstructions and MIPs were obtained to evaluate the vascular anatomy. Carotid stenosis measurements (when applicable) are obtained utilizing NASCET criteria, using the distal internal carotid diameter as the denominator. CONTRAST:  50 mL Isovue 370 COMPARISON:  Noncontrast head CT earlier today. No prior angiographic imaging. FINDINGS: CTA NECK FINDINGS Aortic arch: Normal variant aortic arch branching pattern with a common origin of the brachiocephalic and left common carotid arteries. Widely patent arch vessel origins. Right carotid system: Widely patent common carotid artery. ICA occlusion at its origin without reconstitution in the neck. Patent ECA. Left carotid system: Patent without evidence of stenosis or dissection. Vertebral arteries: Patent and codominant without evidence of stenosis or dissection. Skeleton: Moderate disc degeneration throughout the cervical spine. Other neck: No mass or enlarged lymph nodes.  Upper chest: Clear lung apices. Review of the MIP images confirms the above findings CTA HEAD FINDINGS Anterior circulation: The intracranial right ICA is occluded proximally with distal reconstitution at the supraclinoid level via the posterior communicating artery. The intracranial left ICA is widely patent. ACAs and MCAs are patent with mild branch vessel irregularity but no evidence of proximal branch occlusion or significant stenosis. No aneurysm or vascular malformation. Posterior circulation: The intracranial vertebral arteries are widely patent to the basilar. Left PICA, bilateral AICA, and bilateral SCA origins are patent. Right PICA is not well seen. The basilar artery is widely patent. There are small to medium-sized right and diminutive left posterior communicating arteries. PCAs are patent without evidence of significant stenosis. No aneurysm or vascular malformation. Venous sinuses: Patent. Anatomic variants: None. Delayed phase: Left caudate lacunar infarct, more conspicuous than on the earlier CT though favored to be chronic given evidence of mild volume loss. No abnormal enhancement. Review of the MIP images confirms the above findings IMPRESSION: 1. 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 2. Otherwise patent major arterial vasculature in the circle of Willis and neck without evidence of significant stenosis. These results were communicated to Dr. Rosalin Hawking at 11:52 am on 05/04/2017 by text page via the [facility] messaging system. Electronically Signed   By: Logan Bores M.D.   On: 05/04/2017 11:53   LE Doppler 05/04/2017: Final Interpretation: Right: There is evidence of acute DVT in the Femoral vein, and Posterior Tibial veins. There is evidence of acute superficial thrombosis of the great saphenous vein. No cystic structure found in the popliteal fossa. Left: There is no evidence of deep vein thrombosis in the lower extremity.There is no evidence of superficial venous  thrombosis. No cystic structure found in the popliteal fossa.  EKG 05/04/2017: Possible ectopic atrial rhythm 100 bpm. Normal axis. Possible old inferior infarct. Anterolateral deep T-wave inversions, cardiac surgery ischemia.  Echocardiogram pending  Review of Systems  Constitutional: Positive for malaise/fatigue and weight loss.  HENT: Negative.   Eyes: Positive for blurred vision (Now improving). Negative for double vision.  Respiratory: Positive for shortness of breath. Negative for cough and hemoptysis.   Cardiovascular: Positive for leg swelling. Negative for chest pain, orthopnea, claudication and PND.  Gastrointestinal: Negative for abdominal pain, diarrhea, nausea and vomiting.  Genitourinary:       Vaginal bleeding  Musculoskeletal: Negative.   Skin: Negative.  Neurological: Negative for seizures and loss of consciousness.       Generalized weakness, focal right foot weakness. Improving   Endo/Heme/Allergies: Does not bruise/bleed easily.  Psychiatric/Behavioral:       Schizophrenia  All other systems reviewed and are negative.  Blood pressure 110/68, pulse (!) 123, temperature 99.3 F (37.4 C), temperature source Oral, resp. rate 20, height 5' 8"  (1.727 m), weight 79.8 kg (175 lb 14.8 oz), SpO2 92 %. Physical Exam  Nursing note and vitals reviewed. Constitutional: She is oriented to person, place, and time. She appears well-developed and well-nourished. No distress.  HENT:  Head: Normocephalic.  Eyes: Pupils are equal, round, and reactive to light.  Neck: Normal range of motion. Neck supple. No JVD present.  Cardiovascular: Regular rhythm.  Murmur (Soft II/VI early systolic apical murmur) heard. Respiratory: Effort normal and breath sounds normal. She has no wheezes. She has no rales.  GI: Soft. She exhibits distension (Possible ascites). There is no tenderness.  Musculoskeletal: Normal range of motion. She exhibits no edema.  Lymphadenopathy:    She has no  cervical adenopathy.  Neurological: She is alert and oriented to person, place, and time. A cranial nerve deficit is present.  No gross focal deficit   Skin: Skin is warm and dry.  Psychiatric: She has a normal mood and affect.    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 Anemia, tachycardia, CVA Acute femoral DVT Sinus/ectopic atrial tachcyardia Anemia: Could this be related to blood loss from vaginal bleeding? Reports h/o fibroids Possible ascites AKICKD H/o hypertension  Recommendations: While DVT and bilateral occipital stroke raise concern for paradoxical embolus with PFO, Transcranial Doppler is sensitive for shunt protection. Negative transitional Doppler would reduce possibility of PFO. However, cardiaoembolic etiology still possible. Recommend transthoracic echocardiogram with contrast study. Recommend outpatient TEE evaluation. Will arrange this. Sinus or possible ectopic atrial tachycardia could be secondary to anemia. Transfer to sick echocardiogram will also evaluate for any structural abnormalities. Her troponin elevation is not due to acute MI. Would arrange for outpatient stress test. I am concerned about femoral DVT in a schizophrenic patient with significant immobilization, perhaps due to psychiatric issues. Also, she reports history of vaginal bleeding, has abdominal distention, with significant drop in her hemoglobin and platelet count, compared to baseline. Agree with evaluation for any abdominal malignant etiology for her distention, as well as DVT. Also, consider hypercoagulability workup if no secondary cause for DVT identified.     Bekah Igoe J Buell Parcel 05/04/2017, 3:56 PM   Cynthia Stainback Esther Hardy, MD [facility] [place] Pager: [phone] Office: [phone] If no answer Cell [phone]

## 2017-05-04 NOTE — Progress Notes (Addendum)
Bilateral lower extremity venous duplex has been completed. There is evidence of acute deep vein thrombosis involving the femoral, and posterior tibial veins of the right lower extremity. There is also evidence of acute superficial vein thrombosis involving the greater saphenous vein of the right lower extremity.  Results were given to the patient's nurse, Mal[place].  05/04/17 8:19 AM [name] [name] RVT

## 2017-05-04 NOTE — Progress Notes (Signed)
PT Cancellation Note  Patient Details Name: [name] [name] MRN: [id number] DOB: December 01, 1950   Cancelled Treatment:    Reason Eval/Treat Not Completed: Medical issues which prohibited therapy. Chart reviewed, with newly identified DVT as of this morning. Per policy, with hold PT services at this time and allow for 5-hours of therapeutic dosing anticoagulation unless another plan is determined by attending. Additionally, most recent Troponin: 0.12, awaiting subsequent values for trend prior to OOB assessment.   8:50 AM, 05/04/17 Etta Grandchild, PT, DPT Physical Therapist - [place] [phone] (Pager)  [phone] (Office)     Kimberely Mccannon C 05/04/2017, 8:47 AM

## 2017-05-04 NOTE — ED Notes (Signed)
Nurse currently drawing labs 

## 2017-05-04 NOTE — Progress Notes (Signed)
received elevated troponin of 0.14, paged on call.

## 2017-05-04 NOTE — Progress Notes (Signed)
ANTICOAGULATION CONSULT NOTE - Initial Consult  Pharmacy Consult:  Eliquis >> Heparin Indication:  Acute DVT / PE with RHS  Allergies  Allergen Reactions  . Nabumetone Rash    Patient Measurements: Height: 5\' 8"  (172.7 cm) Weight: 175 lb 14.8 oz (79.8 kg) IBW/kg (Calculated) : 63.9 Heparin Dosing Weight: 80 kg  Vital Signs: Temp: 98.6 F (37 C) (03/15 1755) Temp Source: Oral (03/15 1257) BP: 102/70 (03/15 1755) Pulse Rate: 123 (03/15 1023)  Labs: Recent Labs    05/03/17 2352 05/04/17 0000 05/04/17 0457 05/04/17 1010 05/04/17 1653  HGB 10.3* 10.9* 10.3*  --   --   HCT 30.8* 32.0* 31.0*  --   --   PLT 121*  --  121*  --   --   APTT 32  --   --   --   --   LABPROT 16.2*  --   --   --   --   INR 1.32  --   --   --   --   CREATININE 1.63* 1.50* 1.48*  --   --   TROPONINI  --   --  0.12* 0.13* 0.15*    Estimated Creatinine Clearance: 42.6 mL/min (A) (by C-G formula based on SCr of 1.48 mg/dL (H)).   Medical History: Past Medical History:  Diagnosis Date  . CTS (carpal tunnel syndrome)    bilateral  . FATIGUE 08/27/2008  . GLUCOSE INTOLERANCE 06/25/2008  . HYPERLIPIDEMIA 06/25/2008  . HYPERTENSION 06/25/2008  . Impaired glucose tolerance 06/12/2010  . SCHIZOPHRENIA 06/25/2008      Assessment: [name] [name] presented with blurry vision, right-sided weakness and dizziness.  CT showed new CVA without hemorrhage.  He also has an acute DVT and was started on Eliquis (first dose today 05/04/17 at 1216).  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  Platelet count is low but no bleeding reported.  Will start IV heparin 12 hours after the Eliquis loading dose and without a bolus.  Difficult situation having to weigh the clot burden and risk for hemorrhagic transformation.   Goal of Therapy:  Heparin level 0.3 - 0.5 units/mL Monitor platelets by anticoagulation protocol: Yes    Plan:  D/C Eliquis At midnight, start IV heparin  gtt at 1200 units/hr Check 6 hr aPTT and heparin level Daily heparin level, aPTT and CBC   Ajani Schnieders D. Mina Marble, PharmD, BCPS Pager:  [phone] 05/04/2017, 8:03 PM

## 2017-05-04 NOTE — Progress Notes (Signed)
Right femoral cath site with no bleeding, hematoma. Ok to discharge.  Nigel Mormon, MD [facility] [place] Pager: [phone] Office: [phone] If no answer Cell [phone]

## 2017-05-04 NOTE — Progress Notes (Signed)
SLP Cancellation Note  Patient Details Name: DEMISHA NOKES MRN: [id number] DOB: 08/15/1950   Cancelled treatment:       Reason Eval/Treat Not Completed: Patient at procedure or test/unavailable. Headed to procedure   Iliany Losier, Katherene Ponto 05/04/2017, 11:07 AM

## 2017-05-04 NOTE — Consult Note (Signed)
Requesting Physician: Dr. Wyvonnia Dusky    Chief Complaint:   History obtained from: Patient and Chart     HPI:                                                                                                                                       [name] [name] is an 67 y.o. female  With past medical history of hypertension, hyperlipidemia, schizophrenia presents with right-sided weakness that began around 10:30 PM last night. Prior to this, the patient has been complaining of blurry vision and dizziness that started on Friday.  CT head in ED showed bilateral occipital lobe strokes. Blood pressure currently 630 systolic.    Date last known well: 3.9.19 tPA Given: outside window NIHSS: 2 Baseline MRS 0     Past Medical History:  Diagnosis Date  . CTS (carpal tunnel syndrome)    bilateral  . FATIGUE 08/27/2008  . GLUCOSE INTOLERANCE 06/25/2008  . HYPERLIPIDEMIA 06/25/2008  . HYPERTENSION 06/25/2008  . Impaired glucose tolerance 06/12/2010  . SCHIZOPHRENIA 06/25/2008    Past Surgical History:  Procedure Laterality Date  . DILATION AND CURETTAGE OF UTERUS  1999   hx of after miscarriage  . s/p cervical procedure  1995   LEEP    Family History  Problem Relation Age of Onset  . Hypertension Other   . Stroke Other    Social History:  reports that she has quit smoking. She quit smokeless tobacco use about 41 years ago. She reports that she does not drink alcohol. Her drug history is not on file.  Allergies:  Allergies  Allergen Reactions  . Nabumetone     REACTION: rash    Medications:                                                                                                                        I reviewed home medications   ROS:  14 systems reviewed and negative except above    Examination:                                                                                                       General: Appears well-developed and well-nourished.  Psych: Affect appropriate to situation Eyes: No scleral injection HENT: No OP obstrucion Head: Normocephalic.  Cardiovascular: Sinus tachycardia Respiratory: Effort normal and breath sounds normal to anterior ascultation GI: Soft.  No distension. There is no tenderness.  Skin: WDI   Neurological Examination Mental Status: Alert, oriented, thought content appropriate.  Speech fluent without evidence of aphasia. Able to follow 3 step commands without difficulty. Cranial Nerves: II: Visual fields grossly normal, no obvious VF cuts III,IV, VI: ptosis not present, extra-ocular motions intact bilaterally, pupils equal, round, reactive to light and accommodation V,VII: smile symmetric, facial light touch sensation normal bilaterally VIII: hearing normal bilaterally IX,X: uvula rises symmetrically XI: bilateral shoulder shrug XII: midline tongue extension Motor: Right : Upper extremity   4/5    Left:     Upper extremity   5/5  Lower extremity   4+/5     Lower extremity   5/5 Tone and bulk:normal tone throughout; no atrophy noted Sensory: Pinprick and light touch intact throughout, bilaterally Deep Tendon Reflexes: 2+ and symmetric throughout Plantars: Right: downgoing   Left: downgoing Cerebellar: normal finger-to-nose, normal rapid alternating movements and normal heel-to-shin test Gait: normal gait and station     Lab Results: Basic Metabolic Panel: Recent Labs  Lab 05/03/17 2352 05/04/17 0000  NA 133* 132*  K 3.7 3.7  CL 100* 98*  CO2 22  --   GLUCOSE 132* 128*  BUN 15 15  CREATININE 1.63* 1.50*  CALCIUM 8.4*  --     CBC: Recent Labs  Lab 05/03/17 2352 05/04/17 0000  WBC 12.2*  --   NEUTROABS 10.0*  --   HGB 10.3* 10.9*  HCT 30.8* 32.0*  MCV 80.0  --   PLT 121*  --     Coagulation Studies: Recent Labs    05/03/17 2352  LABPROT 16.2*  INR 1.32     Imaging: Dg Chest 2 View  Result Date: 05/04/2017 CLINICAL DATA:  Acute onset of right-sided weakness. Dizziness. Tachycardia and orthostatic hypotension. EXAM: CHEST - 2 VIEW COMPARISON:  None. FINDINGS: There is mild elevation of the right hemidiaphragm. There is no evidence of focal opacification, pleural effusion or pneumothorax. The heart is normal in size; the mediastinal contour is within normal limits. No acute osseous abnormalities are seen. IMPRESSION: Mild elevation of the right hemidiaphragm. Lungs remain grossly clear. Electronically Signed   By: Garald Balding M.D.   On: 05/04/2017 01:25   Ct Head Wo Contrast  Result Date: 05/04/2017 CLINICAL DATA:  Right-sided weakness and dizziness EXAM: CT HEAD WITHOUT CONTRAST TECHNIQUE: Contiguous axial images were obtained from the base of the skull through the vertex without intravenous contrast. COMPARISON:  None. FINDINGS: Brain: Areas of focal hypoattenuation within both occipital lobes suggesting recent infarction. No hemorrhage or mass effect. Otherwise normal appearance of the  brain parenchyma and extra axial spaces for age. Vascular: No hyperdense vessel or unexpected vascular calcification. Skull: Normal visualized skull base, calvarium and extracranial soft tissues. Sinuses/Orbits: No sinus fluid levels or advanced mucosal thickening. No mastoid effusion. Normal orbits. 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 MRI recommended. These results were called by telephone at the time of interpretation on 05/04/2017 at 1:23 am to Dr. Ezequiel Essex , who verbally acknowledged these results. Electronically Signed   By: Ulyses Jarred M.D.   On: 05/04/2017 01:23     ASSESSMENT AND PLAN  [name] [name] is an 67 y.o. female  With past medical history of hypertension, hyperlipidemia, schizophrenia presents with right-sided weakness that began around 10:30 PM last night and blurry vision  that began on Friday. CT head showed bilateral occipital lobe strokes that does not explain patient's current weakness. I suspect that she's had another stroke last night. Suspect cardioembolic etiology vs watershed in etiology.MR angiogram have been ordered and are pending.   Acute Ischemic Stroke   Risk factors:HTN, HLD  Recommend # MRI of the brain without contrast #MRA Head and neck  #Transthoracic Echo  # Start patient on ASA 325mg  daily #Start or continue Atorvastatin 80 mg/other high intensity statin # BP goal: permissive HTN upto 220/120 mmHG # HBAIC and Lipid profile # Telemetry monitoring # Frequent neuro checks # stroke swallow screen  Please page stroke NP  Or  PA  Or MD from 8am -4 pm  as this patient from this time will be  followed by the stroke.   You can look them up on [email or web address]  Password [facility]    Sushanth Aroor [facility] Pager Number [phone]

## 2017-05-04 NOTE — Progress Notes (Signed)
ANTICOAGULATION CONSULT NOTE - Initial Consult  Pharmacy Consult for Eliquis Indication: DVT  Allergies  Allergen Reactions  . Nabumetone     REACTION: rash    Patient Measurements: Height: 5\' 8"  (172.7 cm) Weight: 175 lb 14.8 oz (79.8 kg) IBW/kg (Calculated) : 63.9  Vital Signs: Temp: 99.6 F (37.6 C) (03/15 1040) Temp Source: Oral (03/15 1040) BP: 103/72 (03/15 1023) Pulse Rate: 123 (03/15 1023)  Labs: Recent Labs    05/03/17 2352 05/04/17 0000 05/04/17 0457 05/04/17 1010  HGB 10.3* 10.9* 10.3*  --   HCT 30.8* 32.0* 31.0*  --   PLT 121*  --  121*  --   APTT 32  --   --   --   LABPROT 16.2*  --   --   --   INR 1.32  --   --   --   CREATININE 1.63* 1.50* 1.48*  --   TROPONINI  --   --  0.12* 0.13*    Estimated Creatinine Clearance: 42.6 mL/min (A) (by C-G formula based on SCr of 1.48 mg/dL (H)).   Medical History: Past Medical History:  Diagnosis Date  . CTS (carpal tunnel syndrome)    bilateral  . FATIGUE 08/27/2008  . GLUCOSE INTOLERANCE 06/25/2008  . HYPERLIPIDEMIA 06/25/2008  . HYPERTENSION 06/25/2008  . Impaired glucose tolerance 06/12/2010  . SCHIZOPHRENIA 06/25/2008   Assessment: CC/HPI: Blurry vision, R sided weakness, dizziness. CT=showed bilateral occipital lobe strokes  PMH: hypertension, hyperlipidemia, schizophrenia, carpal tunnel, glucose intolerance, HTN, HLD  Anticoag: New DVT and new CVA. Thrombocytopenia, anemia with Hgb 10.3, Plts 121 - 3/15: LE venous duplex: There is evidence of acute deep vein thrombosis involving the femoral vein of the right lower extremity. There is also evidence of acute superficial vein thrombosis involving the greater saphenous vein of the right lower extremity.   Goal of Therapy:  Therapeutic oral anticoagulation Monitor platelets by anticoagulation protocol: Yes   Plan:  Eliquis 10mg  BID x 7d then 5mg  BID  Nivin Braniff S. Alford Highland, PharmD, BCPS Clinical Staff Pharmacist Pager [phone]  Eilene Ghazi  [place] 05/04/2017,11:25 AM

## 2017-05-04 NOTE — Progress Notes (Signed)
Pt has to be escorted to have his bubble study, Nurse is currently busy and unable to do go down with the patient. Charge nurse called to see if bubble study can be done portable? Sharee Pimple said she will have to ask the doctor if that can be done and get back to Nurse.

## 2017-05-05 ENCOUNTER — Inpatient Hospital Stay (HOSPITAL_COMMUNITY): Payer: Medicare Other

## 2017-05-05 DIAGNOSIS — I2602 Saddle embolus of pulmonary artery with acute cor pulmonale: Secondary | ICD-10-CM

## 2017-05-05 DIAGNOSIS — I82431 Acute embolism and thrombosis of right popliteal vein: Secondary | ICD-10-CM

## 2017-05-05 LAB — HEPARIN LEVEL (UNFRACTIONATED)
HEPARIN UNFRACTIONATED: 1.42 [IU]/mL — AB (ref 0.30–0.70)
HEPARIN UNFRACTIONATED: 1.2 [IU]/mL — AB (ref 0.30–0.70)

## 2017-05-05 LAB — ECHOCARDIOGRAM COMPLETE
HEIGHTINCHES: 68 in
Weight: 2814.83 oz

## 2017-05-05 LAB — CBC
HCT: 32.2 % — ABNORMAL LOW (ref 36.0–46.0)
Hemoglobin: 10.6 g/dL — ABNORMAL LOW (ref 12.0–15.0)
MCH: 26.2 pg (ref 26.0–34.0)
MCHC: 32.9 g/dL (ref 30.0–36.0)
MCV: 79.7 fL (ref 78.0–100.0)
PLATELETS: 159 10*3/uL (ref 150–400)
RBC: 4.04 MIL/uL (ref 3.87–5.11)
RDW: 16 % — AB (ref 11.5–15.5)
WBC: 10.9 10*3/uL — AB (ref 4.0–10.5)

## 2017-05-05 LAB — URINE CULTURE

## 2017-05-05 LAB — APTT
APTT: 127 s — AB (ref 24–36)
aPTT: 100 seconds — ABNORMAL HIGH (ref 24–36)
aPTT: 101 seconds — ABNORMAL HIGH (ref 24–36)

## 2017-05-05 MED ORDER — HEPARIN (PORCINE) IN NACL 100-0.45 UNIT/ML-% IJ SOLN
950.0000 [IU]/h | INTRAMUSCULAR | Status: DC
  Administered 2017-05-05: 900 [IU]/h via INTRAVENOUS
  Administered 2017-05-07 – 2017-05-08 (×3): 950 [IU]/h via INTRAVENOUS
  Filled 2017-05-05 (×3): qty 250

## 2017-05-05 MED ORDER — HYDRALAZINE HCL 20 MG/ML IJ SOLN: 5.0000 mg | Freq: Four times a day (QID) | INTRAMUSCULAR | Status: DC | PRN

## 2017-05-05 NOTE — Progress Notes (Signed)
PT Cancellation Note  Patient Details Name: [name] [name] MRN: [id number] DOB: 05-06-1950   Cancelled Treatment:    Reason Eval/Treat Not Completed: Medical issues which prohibited therapy. Pt was started on heparin drip 23:49 last night for new DVT. Per PT protocol we will need to wait 24 hours prior to mobilizing the patient OOB. PT to return 3/17 to perform PT eval.  Kittie Plater, PT, DPT Pager #: [phone] Office #: [phone]    [place] 05/05/2017, 8:21 AM

## 2017-05-05 NOTE — Progress Notes (Signed)
ANTICOAGULATION CONSULT NOTE - Follow Up Consult  Pharmacy Consult for Heparin Indication: Acute DVT/PE w/ RHS  Allergies  Allergen Reactions  . Nabumetone Rash    Patient Measurements: Height: 5\' 8"  (172.7 cm) Weight: 175 lb 14.8 oz (79.8 kg) IBW/kg (Calculated) : 63.9 Heparin Dosing Weight: 80 kg  Vital Signs: Temp: 97.8 F (36.6 C) (03/16 0400) Temp Source: Oral (03/16 0400) BP: 110/73 (03/16 0400) Pulse Rate: 95 (03/16 0400)  Labs: Recent Labs    05/03/17 2352 05/04/17 0000 05/04/17 0457 05/04/17 1010 05/04/17 1653 05/05/17 0535  HGB 10.3* 10.9* 10.3*  --   --  10.6*  HCT 30.8* 32.0* 31.0*  --   --  32.2*  PLT 121*  --  121*  --   --  159  APTT 32  --   --   --   --  100*  LABPROT 16.2*  --   --   --   --   --   INR 1.32  --   --   --   --   --   HEPARINUNFRC  --   --   --   --   --  1.42*  CREATININE 1.63* 1.50* 1.48*  --   --   --   TROPONINI  --   --  0.12* 0.13* 0.15*  --     Estimated Creatinine Clearance: 42.6 mL/min (A) (by C-G formula based on SCr of 1.48 mg/dL (H)).   Assessment: Assessment: 46 YOF presented with blurry vision, right-sided weakness and dizziness.  CT showed new CVA without hemorrhage.  He also has an acute DVT and was started on Eliquis (first dose 05/04/17 at 1216). Was stopped and changed to heparin due to chronic/acute PE w/ RHS. Started at 12am this morning. CBC stable and per RN, no bleeding noted. Difficult situation having to weigh the clot burden and risk for hemorrhagic transformation.  Heparin level supratherapeutic and aPTT borderline, but had one dose Eliquis yesterday.   Goal of Therapy:  Heparin level 0.3 - 0.5 units/mL Monitor platelets by anticoagulation protocol: Yes    Plan:  Decrease heparin to 1100 units/hr Check 6 hr aPTT and heparin level Daily heparin level, aPTT, CBC Monitor clinical course, s/sx bleeding   Nida Boatman, PharmD PGY1 Acute Care Pharmacy Resident Pager:  [phone] 05/05/2017,7:28 AM

## 2017-05-05 NOTE — Progress Notes (Addendum)
STROKE TEAM PROGRESS NOTE   History Per Consult Note 05/04/2017 [name] [name] is an 67 y.o. female  With past medical history of hypertension, hyperlipidemia, and schizophrenia presents with right-sided weakness that began around 10:30 PM last night. Prior to this, the patient has been complaining of blurry vision and dizziness that started on Friday. CT head in ED showed bilateral occipital lobe strokes. Blood pressure currently 295 systolic.   Date last known well: 3.9.19 tPA Given: outside window NIHSS: 2 Baseline MRS 0   SUBJECTIVE (INTERVAL HISTORY) Her husband is at the bedside.  Overall she feels her condition is stable.  Found to have RLE DVT. Negative TCD bubble study, concerning for malignancy. Will need pan CT.    OBJECTIVE Temp:  [97.7 F (36.5 C)-99 F (37.2 C)] 98.2 F (36.8 C) (03/16 1103) Pulse Rate:  [88-113] 97 (03/16 1103) Cardiac Rhythm: Normal sinus rhythm (03/16 0702) Resp:  [16-20] 16 (03/16 1103) BP: (102-124)/(64-82) 106/78 (03/16 1103) SpO2:  [91 %-100 %] 98 % (03/16 0400)  No results for input(s): GLUCAP in the last 168 hours. Recent Labs  Lab 05/03/17 2352 05/04/17 0000 05/04/17 0457  NA 133* 132* 131*  K 3.7 3.7 3.8  CL 100* 98* 101  CO2 22  --  20*  GLUCOSE 132* 128* 103*  BUN 15 15 14   CREATININE 1.63* 1.50* 1.48*  CALCIUM 8.4*  --  8.3*   Recent Labs  Lab 05/03/17 2352  AST 18  ALT 8*  ALKPHOS 54  BILITOT 0.6  PROT 6.2*  ALBUMIN 2.4*   Recent Labs  Lab 05/03/17 2352 05/04/17 0000 05/04/17 0457 05/05/17 0535  WBC 12.2*  --  10.9* 10.9*  NEUTROABS 10.0*  --   --   --   HGB 10.3* 10.9* 10.3* 10.6*  HCT 30.8* 32.0* 31.0* 32.2*  MCV 80.0  --  79.5 79.7  PLT 121*  --  121* 159   Recent Labs  Lab 05/04/17 0457 05/04/17 1010 05/04/17 1653  TROPONINI 0.12* 0.13* 0.15*   Recent Labs    05/03/17 2352  LABPROT 16.2*  INR 1.32   Recent Labs    05/04/17 0212  COLORURINE YELLOW  LABSPEC 1.009  PHURINE 6.0  GLUCOSEU  NEGATIVE  HGBUR MODERATE*  BILIRUBINUR NEGATIVE  KETONESUR NEGATIVE  PROTEINUR 100*  NITRITE NEGATIVE  LEUKOCYTESUR LARGE*       Component Value Date/Time   CHOL 137 05/04/2017 0457   TRIG 139 05/04/2017 0457   HDL 38 (L) 05/04/2017 0457   CHOLHDL 3.6 05/04/2017 0457   VLDL 28 05/04/2017 0457   LDLCALC 71 05/04/2017 0457   Lab Results  Component Value Date   HGBA1C 6.2 (H) 05/04/2017      Component Value Date/Time   LABOPIA NONE DETECTED 05/04/2017 0212   COCAINSCRNUR NONE DETECTED 05/04/2017 0212   LABBENZ NONE DETECTED 05/04/2017 0212   AMPHETMU NONE DETECTED 05/04/2017 0212   THCU NONE DETECTED 05/04/2017 0212   LABBARB NONE DETECTED 05/04/2017 0212    Recent Labs  Lab 05/03/17 2352  ETH <10    I have personally reviewed the radiological images below and agree with the radiology interpretations.  IMAGING  Ct Angio Head W Or Wo Contrast 05/04/2017 IMPRESSION:  1. 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  2. Otherwise patent major arterial vasculature in the circle of Willis and neck without evidence of significant stenosis.    Dg Chest 2 View 05/04/2017 IMPRESSION:  Mild elevation of the right hemidiaphragm. Lungs remain  grossly clear.    Ct Head Wo Contrast 05/04/2017 IMPRESSION:  Small bilateral occipital acute to early subacute infarcts without hemorrhage or mass effect. MRI recommended.    Ct Angio Neck W Or Wo Contrast 05/04/2017 IMPRESSION:  1. 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  2. Otherwise patent major arterial vasculature in the circle of Willis and neck without evidence of significant stenosis.      Ct Angio Chest Pe W Or Wo Contrast 05/04/2017 IMPRESSION:  1. Study is positive for a large burden of what appears to be both chronic thrombus an acute pulmonary embolism in the lungs bilaterally (left much greater than right), with CT evidence of right heart strain (RV/LV Ratio =  1.16) consistent with at least submassive (intermediate risk) PE. The presence of right heart strain has been associated with an increased risk of morbidity and mortality. Please activate Code PE by paging [phone].  2. Multiple small pulmonary nodules scattered throughout the lungs bilaterally measuring 2-5 mm in size. These are nonspecific, but given the findings in the abdomen, these are concerning for potential metastatic lesions. Attention on follow-up studies is recommended.  3. Trace bilateral pleural effusions lying dependently.  4. Large volume of ascites in the abdomen is likely malignant given the apparent omental nodularity. Further evaluation with dedicated CT the abdomen and pelvis with IV contrast is recommended in the near future to evaluate for source of primary malignancy.     Mr Brain Wo Contrast 05/04/2017 IMPRESSION:  1. Small regions of acute/early subacute infarction within right posterior parietal and left occipital lobes stable from prior CT given differences in technique. Few additional punctate foci in left frontal, right parietal, and left thalamus.  2. Petechial hemorrhage associated with right posterior parietal infarction.  3. Multiple additional subcentimeter foci of late subacute to chronic infarction scattered throughout the supratentorial and infratentorial brain.  4. Background of moderate chronic microvascular ischemic changes of the brain.    LE venous doppler  Right: There is evidence of acute DVT in the Femoral vein, and Posterior Tibial veins. There is evidence of acute superficial thrombosis of the great saphenous vein. No cystic structure found in the popliteal fossa. Left: There is no evidence of deep vein thrombosis in the lower extremity.There is no evidence of superficial venous thrombosis. No cystic structure found in the popliteal fossa.  TCD bubble study Negative for PFO   TTE pending   PHYSICAL EXAM  Vitals:   05/05/17 0000 05/05/17  0400 05/05/17 0756 05/05/17 1103  BP: 108/78 110/73 124/82 106/78  Pulse: 88 95 (!) 102 97  Resp: 18 18 17 16   Temp: 97.7 F (36.5 C) 97.8 F (36.6 C) 98 F (36.7 C) 98.2 F (36.8 C)  TempSrc: Oral Oral Oral Axillary  SpO2: 100% 98%    Weight:      Height:        General - Well nourished, well developed, in no apparent distress.  Ophthalmologic - fundi not visualized due to noncooperation.  Abdomen - extended abdomen, nontender, but semi-firm   Cardiovascular - Regular rhythm but tachycardia.  Mental Status -  Level of arousal and orientation to time, place, and person were intact. Language including expression, naming, repetition, comprehension was assessed and found intact. Fund of Knowledge was assessed and was intact  Cranial Nerves II - XII - II - Visual field intact OU, mild simultagnosia on the left. III, IV, VI - Extraocular movements intact. V - Facial sensation intact bilaterally. VII -  Facial movement intact bilaterally. VIII - Hearing & vestibular intact bilaterally. X - Palate elevates symmetrically. XI - Chin turning & shoulder shrug intact bilaterally. XII - Tongue protrusion intact.  Motor Strength - The patient's strength was normal in all extremities and pronator drift was absent.  Bulk was normal and fasciculations were absent.   Motor Tone - Muscle tone was assessed at the neck and appendages and was normal.  Reflexes - The patient's reflexes were symmetrical in all extremities and she had no pathological reflexes.  Sensory - Light touch, temperature/pinprick were assessed and were symmetrical.    Coordination - The patient had normal movements in the hands with no ataxia or dysmetria but left ocular apraxia.  Tremor was absent.  Gait and Station - deferred.   ASSESSMENT/PLAN [name] [name] is a 67 y.o. female with history of hypertension, hyperlipidemia, and schizophrenia admitted for transient right-sided weakness, blurry vision and  dizziness. No tPA given due to out of window.    Stroke:  bilateral supra and infra tentorial infarcts, consistent with systemic emboli, given all evidence, concerning for hypercoagulable state due to advanced malignancy  Resultant visual simultanagnosia on the left, and ocular apraxia on the left  MRI bilateral supra and infra patchy and punctate tentorial infarcts  CT head and neck - right ICA occlusion consistent with thrombus at right ICA bifurcation  TCD bubble study negative  2D Echo Negative for PFO, thrombus  LDL 72  HgbA1c 6.2  Was on eliquis but now heparin IV for VTE prophylaxis Fall precautions  Diet Heart Room service appropriate? Yes; Fluid consistency: Thin   aspirin 81 mg daily prior to admission, now on heparin IV.   Ongoing aggressive stroke risk factor management  Therapy recommendations:  Pending   Disposition:  Pending   DVT and PE  LE venous doppler showed RLE DVT  CTA chest showed acute and chronic PE with sub-massive PE  On heparin IV  Likely due to hypercoagulable state secondary to advanced maligancy  Abdominal and lung metastatic cancer  CTA chest showed multiple lung nodules  CTA upper abdomen showed large volume ascites, omental nodularity  Likely the cause of hypercoagulable state  On haparin IV  Recommend pan CT for further evaluation of malignancy  Recommend Oncology consult   Hypertension Stable Permissive hypertension (OK if <180/105) for 24-48 hours post stroke and then gradually normalized within 5-7 days.  Long term BP goal normotensive  Hyperlipidemia  Home meds:  lovastatin   LDL 71, goal < 70  Now on pravastatin  Other Stroke Risk Factors  Advanced age  Other Active Problems  Schizophrenia   AKI Cre 1.63->1.50->1.48  Hyponatremia - 131   Anemia - 10.6 / 32.2  History of vaginal bleeding  Hypocalcemia - 8.3  Mild leukocytosis - 10.9  Elevated troponin enzymes  Outpatient TEE recommended  by cardiology (consult 3/15)   Plan  Recommend pan CT for further evaluation of malignancy  Recommend Oncology consult   Will need long-term anticoagulation  The stroke team will sign off at this time. Please call if we can be of further assistance.  Follow-up [facility] Jessica RN 6 - 8 weeks  Hospital day # 1  Personally examined patient and images, and have participated in and made any corrections needed to history, physical, neuro exam,assessment and plan as stated above.  I have personally obtained the history, evaluated lab date, reviewed imaging studies and agree with radiology interpretations.    Sarina Ill, MD Stroke Neurology  To contact Stroke Continuity provider, please refer to [email or web address] After hours, contact General Neurology

## 2017-05-05 NOTE — Progress Notes (Signed)
Echocardiogram 2D Echocardiogram has been performed.  Joelene Millin 05/05/2017, 12:51 PM

## 2017-05-05 NOTE — Progress Notes (Signed)
CTA findings noted. Suspect primary malignant etiology for systemic venous thromboembolism. Trop elevation likely due to PE in this setting. TEE/stress test on hold for now. Please call us back, if any questions.  Nigel Mormon, MD [facility]. [place] Pager: [phone] Office: [phone] If no answer Cell [phone]

## 2017-05-05 NOTE — Evaluation (Signed)
Speech Language Pathology Evaluation Patient Details Name: [name] [name] MRN: [id number] DOB: 03/24/1950 Today's Date: 05/05/2017 Time: 7062-3762 SLP Time Calculation (min) (ACUTE ONLY): 22 min  Problem List:  Patient Active Problem List   Diagnosis Date Noted  . Stroke ([place]) 05/04/2017  . AKI (acute kidney injury) ([place]) 05/04/2017  . Normocytic anemia 05/04/2017  . Acute lower UTI 05/04/2017  . Cerebral infarction ([place])   . Acute deep vein thrombosis (DVT) of popliteal vein of right lower extremity (HCC)   . Acute saddle pulmonary embolism with acute cor pulmonale (HCC)   . Acute gouty arthritis 05/01/2017  . Impaired glucose tolerance 06/12/2010  . Preventative health care 06/12/2010  . FATIGUE 08/27/2008  . Hyperlipidemia 06/25/2008  . Schizophrenia ([place]) 06/25/2008  . Essential hypertension 06/25/2008   Past Medical History:  Past Medical History:  Diagnosis Date  . CTS (carpal tunnel syndrome)    bilateral  . FATIGUE 08/27/2008  . GLUCOSE INTOLERANCE 06/25/2008  . HYPERLIPIDEMIA 06/25/2008  . HYPERTENSION 06/25/2008  . Impaired glucose tolerance 06/12/2010  . SCHIZOPHRENIA 06/25/2008   Past Surgical History:  Past Surgical History:  Procedure Laterality Date  . DILATION AND CURETTAGE OF UTERUS  1999   hx of after miscarriage  . s/p cervical procedure  1995   LEEP   HPI:  Ms.[name] A Bowenis a 67 y.o.femalewith history of hypertension, hyperlipidemia,andschizophreniaadmitted for transient right-sided weakness, blurry vision and dizziness. No tPA given due to out of window. MRI 05/04/17 revealed Small regions of acute/early subacute infarction within right posterior parietal and left occipital lobes, few additional punctate foci in left frontal, right parietal, and left thalamus.   Assessment / Plan / Recommendation Clinical Impression  Patient presents with moderate cognitive-linguistic impairments with deficits seen today in sustained/selective attention, executive  function, delayed recall, visuoperception and problem solving. In visuoperception task, pt named 50% of pictured items, neglecting those located to left side. Affect is flat and pt requires encouragement from family members to participate in assessment. I suspect she does have baseline impairments due to schizophrenia, however pt reports she was driving and managing her own medications prior to this admission. She has decreased awareness of deficits which may pose safety risks at d/c. I recommend skilled ST to maximize cognitive-linguistic function, reduce caregiver burden and improve safety awareness. Prognosis fair as pt does not appear willing to participate in interventions without encouragement from family. Will follow acutely.    SLP Assessment  SLP Recommendation/Assessment: Patient needs continued Speech Lanaguage Pathology Services SLP Visit Diagnosis: Cognitive communication deficit (R41.841)    Follow Up Recommendations  Other (comment);24 hour supervision/assistance(tba)    Frequency and Duration min 1 x/week  1 week      SLP Evaluation Cognition  Overall Cognitive Status: Impaired/Different from baseline Arousal/Alertness: Awake/alert Orientation Level: Oriented X4 Attention: Selective;Sustained Sustained Attention: Impaired Sustained Attention Impairment: Verbal basic;Functional basic Selective Attention: Impaired Selective Attention Impairment: Functional basic;Verbal basic Memory: Impaired Memory Impairment: Decreased recall of new information(delayed recall 0/5) Awareness: Impaired Awareness Impairment: Intellectual impairment Problem Solving: Impaired Problem Solving Impairment: Functional basic(names 2/3 ways to make $13) Executive Function: Sequencing;Decision Making;Initiating Sequencing: Impaired Sequencing Impairment: Functional basic Decision Making: Impaired Decision Making Impairment: Functional basic Initiating: Impaired Initiating Impairment: Verbal  basic Safety/Judgment: Impaired       Comprehension  Auditory Comprehension Overall Auditory Comprehension: Appears within functional limits for tasks assessed Visual Recognition/Discrimination Discrimination: Not tested Reading Comprehension Reading Status: Not tested    Expression Expression Primary Mode of Expression: Verbal Verbal Expression Overall  Verbal Expression: Appears within functional limits for tasks assessed Naming: Impairment Confrontation: Within functional limits Divergent: 25-49% accurate(suspect 2/2 decreased sustained attention) Written Expression Dominant Hand: Right Written Expression: Not tested   Oral / Motor  Oral Motor/Sensory Function Overall Oral Motor/Sensory Function: Within functional limits Motor Speech Overall Motor Speech: Appears within functional limits for tasks assessed   [place], [place], [place] Pathologist [place] 05/05/2017, 5:44 PM

## 2017-05-05 NOTE — Progress Notes (Signed)
OT Cancellation Note  Patient Details Name: [name] [name] MRN: [id number] DOB: Dec 26, 1950   Cancelled Treatment:    Reason Eval/Treat Not Completed: Patient not medically ready Not Completed: Medical issues which prohibited therapy. Pt was started on heparin drip 23:49 last night for new DVT. Per therapy protocol we will need to wait 24 hours prior to mobilizing the patient OOB.  Peri Maris  [phone] 05/05/2017, 10:14 AM

## 2017-05-05 NOTE — Progress Notes (Signed)
PROGRESS NOTE    [name] [name]  WUJ:[id number] DOB: 18-Jul-1950 DOA: 05/03/2017 PCP: Biagio Borg, MD    Brief Narrative:  67 y.o. female with medical history significant for schizophrenia, coronary artery disease, and hypertension, now presenting to the emergency department for evaluation of weakness, malaise, blurred vision, and loss of appetite.  Patient reports that she been in her usual state of health until approximately 3-4 days ago when she developed a nonspecific malaise and loss of appetite.  Pt diagnosed with metastatic carcinoma unknown type, PE/DVT, acute stroke and elevated troponin most likey due to PE  Cardiology, Neurology, Pulmonology contacted.   Assessment & Plan:   Principal Problem:   Stroke [facility]) - Neurology managing - Most likely due to hypercoagulability secondary to metastatic carcinoma  Metastatic carcinoma - Best chance for diagnosis is paracentesis with cytology place order. This was discussed with pulmonology/critical care specialist   Acute deep vein thrombosis (DVT) of popliteal vein of right lower extremity (HCC)/Acute saddle pulmonary embolism with acute cor pulmonale (HCC) - Secondary to metastatic carcinoma. Continue anticoagulation with heparin  Active Problems:   Schizophrenia ([place]) - Stable currently continue current medical regimen    Essential hypertension - Given acute stroke will discontinue metoprolol - placed hydralazine prn orders    AKI (acute kidney injury) ([place]) - Stable continue to monitor serum creatinine    Normocytic anemia   Acute lower UTI - Pt on Rocephin - urine culture non conclusive     DVT prophylaxis: (Heparin Code Status: Full Family Communication: Discussed with patient and family member at bedside with patient permission Disposition Plan: Pending results of above problems   Consultants:   Neurology  Cardiology  Pulmonology  Interventional radiology  Procedures: Stroke workup  Antimicrobials:  Rocephin   Subjective: Patient has no new complaints. No acute issues overnight  Objective: Vitals:   05/05/17 0400 05/05/17 0756 05/05/17 1103 05/05/17 1546  BP: 110/73 124/82 106/78 103/76  Pulse: 95 (!) 102 97 98  Resp: 18 17 16 18   Temp: 97.8 F (36.6 C) 98 F (36.7 C) 98.2 F (36.8 C) 97.8 F (36.6 C)  TempSrc: Oral Oral Axillary Axillary  SpO2: 98%     Weight:      Height:        Intake/Output Summary (Last 24 hours) at 05/05/2017 1711 Last data filed at 05/05/2017 0913 Gross per 24 hour  Intake 1318.13 ml  Output 750 ml  Net 568.13 ml   Filed Weights   05/04/17 0300  Weight: 79.8 kg (175 lb 14.8 oz)    Examination:  General exam: Appears calm and comfortable, in nad.  Respiratory system: Clear to auscultation. Respiratory effort normal. Equal chest rise.  Cardiovascular system: S1 & S2 heard, RRR. No JVD, murmurs, rubs, gallops or clicks. No pedal edema. Gastrointestinal system: Abdomen is nondistended, soft and nontender. No organomegaly or masses felt. Normal bowel sounds heard. Central nervous system: Alert and awake. Answers questions appropriately has word finding difficulty. Extremities: Symmetric 5 x 5 power. Skin: No rashes, lesions or ulcers, on limited exam. Psychiatry:  Mood & affect appropriate.     Data Reviewed: I have personally reviewed following labs and imaging studies  CBC: Recent Labs  Lab 05/03/17 2352 05/04/17 0000 05/04/17 0457 05/05/17 0535  WBC 12.2*  --  10.9* 10.9*  NEUTROABS 10.0*  --   --   --   HGB 10.3* 10.9* 10.3* 10.6*  HCT 30.8* 32.0* 31.0* 32.2*  MCV 80.0  --  79.5 79.7  PLT 121*  --  121* 673   Basic Metabolic Panel: Recent Labs  Lab 05/03/17 2352 05/04/17 0000 05/04/17 0457  NA 133* 132* 131*  K 3.7 3.7 3.8  CL 100* 98* 101  CO2 22  --  20*  GLUCOSE 132* 128* 103*  BUN 15 15 14   CREATININE 1.63* 1.50* 1.48*  CALCIUM 8.4*  --  8.3*   GFR: Estimated Creatinine Clearance: 42.6 mL/min (A) (by C-G  formula based on SCr of 1.48 mg/dL (H)). Liver Function Tests: Recent Labs  Lab 05/03/17 2352  AST 18  ALT 8*  ALKPHOS 54  BILITOT 0.6  PROT 6.2*  ALBUMIN 2.4*   No results for input(s): LIPASE, AMYLASE in the last 168 hours. No results for input(s): AMMONIA in the last 168 hours. Coagulation Profile: Recent Labs  Lab 05/03/17 2352  INR 1.32   Cardiac Enzymes: Recent Labs  Lab 05/04/17 0457 05/04/17 1010 05/04/17 1653  TROPONINI 0.12* 0.13* 0.15*   BNP (last 3 results) No results for input(s): PROBNP in the last 8760 hours. HbA1C: Recent Labs    05/04/17 0457  HGBA1C 6.2*   CBG: No results for input(s): GLUCAP in the last 168 hours. Lipid Profile: Recent Labs    05/04/17 0457  CHOL 137  HDL 38*  LDLCALC 71  TRIG 139  CHOLHDL 3.6   Thyroid Function Tests: No results for input(s): TSH, T4TOTAL, FREET4, T3FREE, THYROIDAB in the last 72 hours. Anemia Panel: Recent Labs    05/04/17 0457  VITAMINB12 114*  FOLATE 4.4*  FERRITIN 528*  TIBC 167*  IRON 16*  RETICCTPCT 1.3   Sepsis Labs: No results for input(s): PROCALCITON, LATICACIDVEN in the last 168 hours.  Recent Results (from the past 240 hour(s))  Urine Culture     Status: Abnormal   Collection Time: 05/04/17  7:25 PM  Result Value Ref Range Status   Specimen Description URINE, RANDOM  Final   Special Requests NONE  Final   Culture (A)  Final    <10,000 COLONIES/mL INSIGNIFICANT GROWTH Performed at [facility], [address] [place], [place] [address]    Report Status 05/05/2017 FINAL  Final         Radiology Studies: Ct Angio Head W Or Wo Contrast  Result Date: 05/04/2017 CLINICAL DATA:  Stroke follow-up. Bilateral occipital infarcts on CT. Right-sided weakness. Blurry vision. Dizziness. EXAM: CT ANGIOGRAPHY HEAD AND NECK TECHNIQUE: Multidetector CT imaging of the head and neck was performed using the standard protocol during bolus administration of intravenous contrast.  Multiplanar CT image reconstructions and MIPs were obtained to evaluate the vascular anatomy. Carotid stenosis measurements (when applicable) are obtained utilizing NASCET criteria, using the distal internal carotid diameter as the denominator. CONTRAST:  50 mL Isovue 370 COMPARISON:  Noncontrast head CT earlier today. No prior angiographic imaging. FINDINGS: CTA NECK FINDINGS Aortic arch: Normal variant aortic arch branching pattern with a common origin of the brachiocephalic and left common carotid arteries. Widely patent arch vessel origins. Right carotid system: Widely patent common carotid artery. ICA occlusion at its origin without reconstitution in the neck. Patent ECA. Left carotid system: Patent without evidence of stenosis or dissection. Vertebral arteries: Patent and codominant without evidence of stenosis or dissection. Skeleton: Moderate disc degeneration throughout the cervical spine. Other neck: No mass or enlarged lymph nodes. Upper chest: Clear lung apices. Review of the MIP images confirms the above findings CTA HEAD FINDINGS Anterior circulation: The intracranial right ICA is occluded proximally with distal reconstitution  at the supraclinoid level via the posterior communicating artery. The intracranial left ICA is widely patent. ACAs and MCAs are patent with mild branch vessel irregularity but no evidence of proximal branch occlusion or significant stenosis. No aneurysm or vascular malformation. Posterior circulation: The intracranial vertebral arteries are widely patent to the basilar. Left PICA, bilateral AICA, and bilateral SCA origins are patent. Right PICA is not well seen. The basilar artery is widely patent. There are small to medium-sized right and diminutive left posterior communicating arteries. PCAs are patent without evidence of significant stenosis. No aneurysm or vascular malformation. Venous sinuses: Patent. Anatomic variants: None. Delayed phase: Left caudate lacunar infarct, more  conspicuous than on the earlier CT though favored to be chronic given evidence of mild volume loss. No abnormal enhancement. Review of the MIP images confirms the above findings IMPRESSION: 1. 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 2. Otherwise patent major arterial vasculature in the circle of Willis and neck without evidence of significant stenosis. These results were communicated to Dr. Rosalin Hawking at 11:52 am on 05/04/2017 by text page via the [facility] messaging system. Electronically Signed   By: Logan Bores M.D.   On: 05/04/2017 11:53   Dg Chest 2 View  Result Date: 05/04/2017 CLINICAL DATA:  Acute onset of right-sided weakness. Dizziness. Tachycardia and orthostatic hypotension. EXAM: CHEST - 2 VIEW COMPARISON:  None. FINDINGS: There is mild elevation of the right hemidiaphragm. There is no evidence of focal opacification, pleural effusion or pneumothorax. The heart is normal in size; the mediastinal contour is within normal limits. No acute osseous abnormalities are seen. IMPRESSION: Mild elevation of the right hemidiaphragm. Lungs remain grossly clear. Electronically Signed   By: Garald Balding M.D.   On: 05/04/2017 01:25   Ct Head Wo Contrast  Result Date: 05/04/2017 CLINICAL DATA:  Right-sided weakness and dizziness EXAM: CT HEAD WITHOUT CONTRAST TECHNIQUE: Contiguous axial images were obtained from the base of the skull through the vertex without intravenous contrast. COMPARISON:  None. FINDINGS: Brain: Areas of focal hypoattenuation within both occipital lobes suggesting recent infarction. No hemorrhage or mass effect. Otherwise normal appearance of the brain parenchyma and extra axial spaces for age. Vascular: No hyperdense vessel or unexpected vascular calcification. Skull: Normal visualized skull base, calvarium and extracranial soft tissues. Sinuses/Orbits: No sinus fluid levels or advanced mucosal thickening. No mastoid effusion. Normal orbits. IMPRESSION: Small  bilateral occipital acute to early subacute infarcts without hemorrhage or mass effect. MRI recommended. These results were called by telephone at the time of interpretation on 05/04/2017 at 1:23 am to Dr. Ezequiel Essex , who verbally acknowledged these results. Electronically Signed   By: Ulyses Jarred M.D.   On: 05/04/2017 01:23   Ct Angio Neck W Or Wo Contrast  Result Date: 05/04/2017 CLINICAL DATA:  Stroke follow-up. Bilateral occipital infarcts on CT. Right-sided weakness. Blurry vision. Dizziness. EXAM: CT ANGIOGRAPHY HEAD AND NECK TECHNIQUE: Multidetector CT imaging of the head and neck was performed using the standard protocol during bolus administration of intravenous contrast. Multiplanar CT image reconstructions and MIPs were obtained to evaluate the vascular anatomy. Carotid stenosis measurements (when applicable) are obtained utilizing NASCET criteria, using the distal internal carotid diameter as the denominator. CONTRAST:  50 mL Isovue 370 COMPARISON:  Noncontrast head CT earlier today. No prior angiographic imaging. FINDINGS: CTA NECK FINDINGS Aortic arch: Normal variant aortic arch branching pattern with a common origin of the brachiocephalic and left common carotid arteries. Widely patent arch vessel origins. Right carotid  system: Widely patent common carotid artery. ICA occlusion at its origin without reconstitution in the neck. Patent ECA. Left carotid system: Patent without evidence of stenosis or dissection. Vertebral arteries: Patent and codominant without evidence of stenosis or dissection. Skeleton: Moderate disc degeneration throughout the cervical spine. Other neck: No mass or enlarged lymph nodes. Upper chest: Clear lung apices. Review of the MIP images confirms the above findings CTA HEAD FINDINGS Anterior circulation: The intracranial right ICA is occluded proximally with distal reconstitution at the supraclinoid level via the posterior communicating artery. The intracranial left  ICA is widely patent. ACAs and MCAs are patent with mild branch vessel irregularity but no evidence of proximal branch occlusion or significant stenosis. No aneurysm or vascular malformation. Posterior circulation: The intracranial vertebral arteries are widely patent to the basilar. Left PICA, bilateral AICA, and bilateral SCA origins are patent. Right PICA is not well seen. The basilar artery is widely patent. There are small to medium-sized right and diminutive left posterior communicating arteries. PCAs are patent without evidence of significant stenosis. No aneurysm or vascular malformation. Venous sinuses: Patent. Anatomic variants: None. Delayed phase: Left caudate lacunar infarct, more conspicuous than on the earlier CT though favored to be chronic given evidence of mild volume loss. No abnormal enhancement. Review of the MIP images confirms the above findings IMPRESSION: 1. 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 2. Otherwise patent major arterial vasculature in the circle of Willis and neck without evidence of significant stenosis. These results were communicated to Dr. Rosalin Hawking at 11:52 am on 05/04/2017 by text page via the [facility] messaging system. Electronically Signed   By: Logan Bores M.D.   On: 05/04/2017 11:53   Ct Angio Chest Pe W Or Wo Contrast  Result Date: 05/04/2017 CLINICAL DATA:  67 year old female with history of acute deep venous thrombosis today. EXAM: CT ANGIOGRAPHY CHEST WITH CONTRAST TECHNIQUE: Multidetector CT imaging of the chest was performed using the standard protocol during bolus administration of intravenous contrast. Multiplanar CT image reconstructions and MIPs were obtained to evaluate the vascular anatomy. CONTRAST:  25mL ISOVUE-370 IOPAMIDOL (ISOVUE-370) INJECTION 76% COMPARISON:  No priors. FINDINGS: Cardiovascular: In the distal left main pulmonary artery extending into lobar, segmental and subsegmental sized branches throughout the left  lung there is a large filling defect, compatible with pulmonary embolism. This appears to be predominantly nonocclusive, and some of the filling defects are centrally located while others are more eccentric, suggesting a combination of both acute and chronic thrombus. In addition, in the distal right main pulmonary artery there is a small filling defect, compatible with additional embolism. Heart size is upper limits of normal, however, right ventricle appears dilated when compared with the (right ventricular diameter of 44 mm versus 38 mm on the left (RV to LV ratio of 1.16)). There is no significant pericardial fluid, thickening or pericardial calcification. Mediastinum/Nodes: No pathologically enlarged mediastinal or hilar lymph nodes. Esophagus is unremarkable in appearance. No axillary lymphadenopathy. Lungs/Pleura: Several scattered small 2-5 mm pulmonary nodules are noted throughout the lungs bilaterally. Patchy areas of ground-glass attenuation in the lungs bilaterally interspersed with areas of lucency, which could reflect heterogeneous perfusion of the lungs in the setting of pulmonary embolism. No confluent consolidative airspace disease. Trace bilateral pleural effusions lying dependently. Upper Abdomen: Diffuse low attenuation throughout the visualized hepatic parenchyma, indicative of hepatic steatosis. Large volume of ascites incompletely imaged. Nodularity throughout the omentum, concerning for intraperitoneal spread of malignancy. Left kidney is severely atrophic. Musculoskeletal: There are  no aggressive appearing lytic or blastic lesions noted in the visualized portions of the skeleton. Review of the MIP images confirms the above findings. IMPRESSION: 1. Study is positive for a large burden of what appears to be both chronic thrombus an acute pulmonary embolism in the lungs bilaterally (left much greater than right), with CT evidence of right heart strain (RV/LV Ratio = 1.16) consistent with at  least submassive (intermediate risk) PE. The presence of right heart strain has been associated with an increased risk of morbidity and mortality. Please activate Code PE by paging [phone]. 2. Multiple small pulmonary nodules scattered throughout the lungs bilaterally measuring 2-5 mm in size. These are nonspecific, but given the findings in the abdomen, these are concerning for potential metastatic lesions. Attention on follow-up studies is recommended. 3. Trace bilateral pleural effusions lying dependently. 4. Large volume of ascites in the abdomen is likely malignant given the apparent omental nodularity. Further evaluation with dedicated CT the abdomen and pelvis with IV contrast is recommended in the near future to evaluate for source of primary malignancy. Critical Value/emergent results were called by telephone at the time of interpretation on 05/04/2017 at 7:34 pm to nurse Jenny Reichmann for Dr. Landis Gandy, who verbally acknowledged these results. Electronically Signed   By: Vinnie Langton M.D.   On: 05/04/2017 19:35   Mr Brain Wo Contrast  Result Date: 05/04/2017 CLINICAL DATA:  67 y/o F; stroke follow-up. Right-sided weakness and blurred vision. EXAM: MRI HEAD WITHOUT CONTRAST TECHNIQUE: Axial DWI, coronal DWI, sagittal T1 FLAIR, axial T2 FLAIR, and axial SWAN sequences were acquired. COMPARISON:  05/04/2017 CT of head and CTA of head. FINDINGS: Brain: Small regions of reduced diffusion in right posterior parietal and left occipital lobes compatible with acute/early subacute infarction. Additionally there are a few punctate foci with reduced diffusion on ADC in left frontal and right parietal lobes as well as left thalamus. There are numerous additional foci of hyperintensity scattered throughout the supratentorial and infratentorial brain with intermediate to increased diffusion on ADC compatible with late subacute to chronic infarctions. Curvilinear susceptibility hypointensity within the right  posterior parietal acute infarction is compatible with petechial hemorrhage. No additional susceptibility hypointensity to indicate intracranial hemorrhage. No hydrocephalus or significant mass effect. Areas of infarction are associated with T2 FLAIR hyperintense signal abnormality and there is a background of moderate chronic microvascular ischemic changes of the brain. Vascular: Loss of flow void in right petrous and cavernous ICA with known occlusion. Skull and upper cervical spine: Normal marrow signal. Sinuses/Orbits: Negative. Other: None. IMPRESSION: 1. Small regions of acute/early subacute infarction within right posterior parietal and left occipital lobes stable from prior CT given differences in technique. Few additional punctate foci in left frontal, right parietal, and left thalamus. 2. Petechial hemorrhage associated with right posterior parietal infarction. 3. Multiple additional subcentimeter foci of late subacute to chronic infarction scattered throughout the supratentorial and infratentorial brain. 4. Background of moderate chronic microvascular ischemic changes of the brain. Electronically Signed   By: Kristine Garbe M.D.   On: 05/04/2017 21:05        Scheduled Meds: . aspirin EC  81 mg Oral Daily  . benztropine  2 mg Oral Daily  . escitalopram  5 mg Oral Daily  . haloperidol  15 mg Oral Daily  . metoprolol tartrate  5 mg Intravenous Q6H  . pravastatin  40 mg Oral q1800  . vitamin B-12  1,000 mcg Oral Daily   Continuous Infusions: . cefTRIAXone (ROCEPHIN)  IV Stopped (  05/05/17 0600)  . heparin 1,100 Units/hr (05/05/17 0913)     LOS: 1 day    Time spent: > 40 minutes  Velvet Bathe, MD [facility] Pager [phone]  If 7PM-7AM, please contact night-coverage [email or web address] Password [email or web address] 05/05/2017, 5:11 PM

## 2017-05-05 NOTE — Progress Notes (Signed)
ANTICOAGULATION CONSULT NOTE - Follow Up Consult  Pharmacy Consult for Heparin Indication: Acute DVT/PE w/ RHS  Allergies  Allergen Reactions  . Nabumetone Rash    Patient Measurements: Height: 5\' 8"  (172.7 cm) Weight: 175 lb 14.8 oz (79.8 kg) IBW/kg (Calculated) : 63.9 Heparin Dosing Weight: 80 kg  Vital Signs: Temp: 97.8 F (36.6 C) (03/16 1546) Temp Source: Axillary (03/16 1546) BP: 103/76 (03/16 1546) Pulse Rate: 98 (03/16 1546)  Labs: Recent Labs    05/03/17 2352 05/04/17 0000 05/04/17 0457 05/04/17 1010 05/04/17 1653 05/05/17 0535 05/05/17 1416 05/05/17 1821  HGB 10.3* 10.9* 10.3*  --   --  10.6*  --   --   HCT 30.8* 32.0* 31.0*  --   --  32.2*  --   --   PLT 121*  --  121*  --   --  159  --   --   APTT 32  --   --   --   --  100* 127* 101*  LABPROT 16.2*  --   --   --   --   --   --   --   INR 1.32  --   --   --   --   --   --   --   HEPARINUNFRC  --   --   --   --   --  1.42* 1.20*  --   CREATININE 1.63* 1.50* 1.48*  --   --   --   --   --   TROPONINI  --   --  0.12* 0.13* 0.15*  --   --   --     Estimated Creatinine Clearance: 42.6 mL/min (A) (by C-G formula based on SCr of 1.48 mg/dL (H)).   Assessment: 31 YOF with new DVT and new CVA - pattern consistent with systemic emboli, concerning for hypercoagulable state d/t advanced malignancy. Heparin was started last night after patient received one dose of Eliquis 10 mg, using aPTT to monitor heparin.   APTT was supratherapeutic this morning, heparin rate was decreased, recheck aPTT was 127 this afternoon, but it was drawn on the same arm heparin was infusing in. Recheck aPTT at 1800, which remains above the modified goal for pt with stroke. Per RN, no bleeding noted, no acute changes clinically.   Goal of Therapy:  Heparin level 0.3 - 0.5 units/mL (will keep aPTT ~ 66-84) Monitor platelets by anticoagulation protocol: Yes  Plan:  Hold heparin for 1.5 hr Restart heparin infusion 900 units/hr at 2200   F/u aPTT and heparin level in AM Daily heparin level, aPTT, CBC Monitor clinical course, s/sx bleeding  Maryanna Shape, PharmD, BCPS  Clinical Pharmacist  Pager: [phone]   05/05/2017,8:15 PM

## 2017-05-06 ENCOUNTER — Inpatient Hospital Stay (HOSPITAL_COMMUNITY): Payer: Medicare Other

## 2017-05-06 LAB — GRAM STAIN

## 2017-05-06 LAB — CBC
HCT: 31.8 % — ABNORMAL LOW (ref 36.0–46.0)
Hemoglobin: 10.7 g/dL — ABNORMAL LOW (ref 12.0–15.0)
MCH: 26.7 pg (ref 26.0–34.0)
MCHC: 33.6 g/dL (ref 30.0–36.0)
MCV: 79.3 fL (ref 78.0–100.0)
PLATELETS: 243 10*3/uL (ref 150–400)
RBC: 4.01 MIL/uL (ref 3.87–5.11)
RDW: 16.1 % — ABNORMAL HIGH (ref 11.5–15.5)
WBC: 12.5 10*3/uL — ABNORMAL HIGH (ref 4.0–10.5)

## 2017-05-06 LAB — BODY FLUID CELL COUNT WITH DIFFERENTIAL
Eos, Fluid: 0 %
Lymphs, Fluid: 53 %
Monocyte-Macrophage-Serous Fluid: 14 % — ABNORMAL LOW (ref 50–90)
Neutrophil Count, Fluid: 33 % — ABNORMAL HIGH (ref 0–25)
WBC FLUID: 599 uL (ref 0–1000)

## 2017-05-06 LAB — APTT
APTT: 71 s — AB (ref 24–36)
aPTT: 71 seconds — ABNORMAL HIGH (ref 24–36)

## 2017-05-06 LAB — ALBUMIN, PLEURAL OR PERITONEAL FLUID: Albumin, Fluid: 1.9 g/dL

## 2017-05-06 LAB — LACTATE DEHYDROGENASE, PLEURAL OR PERITONEAL FLUID: LD, Fluid: 638 U/L — ABNORMAL HIGH (ref 3–23)

## 2017-05-06 LAB — HEPARIN LEVEL (UNFRACTIONATED): Heparin Unfractionated: 0.7 IU/mL (ref 0.30–0.70)

## 2017-05-06 MED ORDER — LIDOCAINE HCL (PF) 1 % IJ SOLN
INTRAMUSCULAR | Status: AC
  Filled 2017-05-06: qty 30

## 2017-05-06 NOTE — Procedures (Signed)
  US guided LLQ paracentesis 3.8 L yellow fluid Sent for labs per MD  Tolerated well 

## 2017-05-06 NOTE — Evaluation (Addendum)
Physical Therapy Evaluation Patient Details Name: [name] [name] MRN: [id number] DOB: 11/27/1950 Today's Date: 05/06/2017   History of Present Illness  Patient is a 67 y/o female who presents with right sided weakness, dizziness, blurry vision and AMS. Head CT-subacute bil occipital infarcts. Doppler- + DVT RLE and CTA chest- + PE bilaterally. CTA- Rt ICA occlusion with thrombus. Brain MRI- Small regions of acute/early subacute infarction within right posterior parietal and left occipital lobes. Few additional punctate foci in left frontal, right parietal, and left thalamus. s/p paracentesis 3/17.  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 PMH includes schizophrenia, CAD and HTN.   Clinical Impression  Patient presents with right sided weakness, impaired vision, cognitive deficits, impaired balance and impaired mobility s/p above. Tolerated transfers and gait training with Min A for balance/safety. RLE weakness noted more when fatigued with decreased foot clearance. Pt independent PTA and has supportive spouse and daughter. HR up to 130 bpm during session and Sp02 remained >93% on RA. Pt not close to functional or cognitive baseline per family. Very slow processing, impaired memory and impaired attention noted. Would benefit from intensive therapies to maximize independence and mobility prior to return home. Will follow acutely.    Follow Up Recommendations CIR;Supervision for mobility/OOB    Equipment Recommendations  Other (comment)(defer to next venue)    Recommendations for Other Services Rehab consult     Precautions / Restrictions Precautions Precautions: Fall Precaution Comments: watch HR Restrictions Weight Bearing Restrictions: No      Mobility  Bed Mobility Overal bed mobility: Needs Assistance Bed Mobility: Supine to Sit     Supine to sit: Supervision;HOB elevated     General bed mobility comments: No assist needed, use of rail.  Transfers Overall transfer  level: Needs assistance Equipment used: None Transfers: Sit to/from [facility] Sit to Stand: Min assist Stand pivot transfers: Min assist       General transfer comment: Min A to power to standing with pt losing balance towards right needing to sit. DId better with automatic movements to SPT to [facility] with Min A.   Ambulation/Gait Ambulation/Gait assistance: Min assist Ambulation Distance (Feet): 100 Feet Assistive device: None Gait Pattern/deviations: Step-through pattern;Decreased stride length;Leaning posteriorly Gait velocity: decreased Gait velocity interpretation: Below normal speed for age/gender General Gait Details: Slow, guarded gait with stiff posture and no arm swing BUEs. Posterior lean with standing rest breaks requiring Min A to maintain upright. HR up to 130 bpm. Sp02 remained mid-high 90s on RA.   Stairs            Wheelchair Mobility    Modified Rankin (Stroke Patients Only) Modified Rankin (Stroke Patients Only) Pre-Morbid Rankin Score: No symptoms Modified Rankin: Moderately severe disability     Balance Overall balance assessment: Needs assistance Sitting-balance support: Feet supported;No upper extremity supported Sitting balance-Leahy Scale: Fair     Standing balance support: During functional activity Standing balance-Leahy Scale: Poor Standing balance comment: Requires Min A for standing balance with pericare.                              Pertinent Vitals/Pain Pain Assessment: No/denies pain    Home Living Family/patient expects to be discharged to:: Private residence Living Arrangements: Spouse/significant other Available Help at Discharge: Family;Available 24 hours/day Type of Home: House Home Access: Level entry     Home Layout: One level Home Equipment: None      Prior Function Level  of Independence: Independent         Comments: Drives. Not working. Wears glasses but needs a new prescription.      Hand Dominance   Dominant Hand: Right    Extremity/Trunk Assessment   Upper Extremity Assessment Upper Extremity Assessment: Defer to OT evaluation(Drift noted RUE)    Lower Extremity Assessment Lower Extremity Assessment: RLE deficits/detail;LLE deficits/detail RLE Deficits / Details: Grossly ~1/5 DF/PF, 3+/5 knee extension/flexion RLE Sensation: WNL LLE Deficits / Details: Grossly ~4/5 throughout.  LLE Sensation: WNL       Communication   Communication: No difficulties  Cognition Arousal/Alertness: Awake/alert Behavior During Therapy: Flat affect Overall Cognitive Status: Impaired/Different from baseline Area of Impairment: Memory;Problem solving;Following commands;Attention                   Current Attention Level: Sustained Memory: Decreased short-term memory Following Commands: Follows one step commands with increased time;Follows multi-step commands inconsistently     Problem Solving: Slow processing;Requires verbal cues General Comments: A&Ox4. Delayed processing and response time to questions asked sometimes requiring repetition. Distracted. Seems to have visual deficits in lower quadrants bilaterally. Blurriness in both eyes as well.       General Comments General comments (skin integrity, edema, etc.): Daughter/spouse present during session.    Exercises     Assessment/Plan    PT Assessment Patient needs continued PT services  PT Problem List Decreased strength;Decreased mobility;Decreased cognition;Cardiopulmonary status limiting activity;Decreased balance       PT Treatment Interventions Functional mobility training;Balance training;Patient/family education;Therapeutic activities;Gait training;Therapeutic exercise;Cognitive remediation;Neuromuscular re-education    PT Goals (Current goals can be found in the Care Plan section)  Acute Rehab PT Goals Patient Stated Goal: to return to PLOF PT Goal Formulation: With patient/family Time For  Goal Achievement: 05/20/17 Potential to Achieve Goals: Good    Frequency Min 4X/week   Barriers to discharge        Co-evaluation               AM-PAC PT "6 Clicks" Daily Activity  Outcome Measure Difficulty turning over in bed (including adjusting bedclothes, sheets and blankets)?: None Difficulty moving from lying on back to sitting on the side of the bed? : None Difficulty sitting down on and standing up from a chair with arms (e.g., wheelchair, bedside commode, etc,.)?: Unable Help needed moving to and from a bed to chair (including a wheelchair)?: A Little Help needed walking in hospital room?: A Little Help needed climbing 3-5 steps with a railing? : A Lot 6 Click Score: 17    End of Session Equipment Utilized During Treatment: Gait belt Activity Tolerance: Patient tolerated treatment well Patient left: Other (comment);with family/visitor present(left standing at the sink with OT) Nurse Communication: Mobility status;Other (comment)(no 02 needed post eval) PT Visit Diagnosis: Muscle weakness (generalized) (M62.81);Hemiplegia and hemiparesis;Unsteadiness on feet (R26.81);Difficulty in walking, not elsewhere classified (R26.2) Hemiplegia - Right/Left: Right Hemiplegia - dominant/non-dominant: Dominant Hemiplegia - caused by: Cerebral infarction    Time: 3716-9678 PT Time Calculation (min) (ACUTE ONLY): 35 min   Charges:   PT Evaluation $PT Eval Moderate Complexity: 1 Mod PT Treatments $Gait Training: 8-22 mins   PT G Codes:        Wray Kearns, PT, DPT [phone]    [name] [name] 05/06/2017, 3:25 PM

## 2017-05-06 NOTE — Progress Notes (Addendum)
ANTICOAGULATION CONSULT NOTE - Follow Up Consult  Pharmacy Consult for Heparin Indication: Acute DVT/PE w/ RHS  Allergies  Allergen Reactions  . Nabumetone Rash    Patient Measurements: Height: 5\' 8"  (172.7 cm) Weight: 175 lb 14.8 oz (79.8 kg) IBW/kg (Calculated) : 63.9 Heparin Dosing Weight: 80 kg  Vital Signs: Temp: 98.1 F (36.7 C) (03/17 0900) Temp Source: Oral (03/17 0900) BP: 128/92 (03/17 0900) Pulse Rate: 100 (03/17 0900)  Labs: Recent Labs    05/03/17 2352 05/04/17 0000 05/04/17 0457 05/04/17 1010 05/04/17 1653 05/05/17 0535 05/05/17 1416 05/05/17 1821 05/06/17 0431 05/06/17 0937  HGB 10.3* 10.9* 10.3*  --   --  10.6*  --   --  10.7*  --   HCT 30.8* 32.0* 31.0*  --   --  32.2*  --   --  31.8*  --   PLT 121*  --  121*  --   --  159  --   --  243  --   APTT 32  --   --   --   --  100* 127* 101*  --  71*  LABPROT 16.2*  --   --   --   --   --   --   --   --   --   INR 1.32  --   --   --   --   --   --   --   --   --   HEPARINUNFRC  --   --   --   --   --  1.42* 1.20*  --   --  0.70  CREATININE 1.63* 1.50* 1.48*  --   --   --   --   --   --   --   TROPONINI  --   --  0.12* 0.13* 0.15*  --   --   --   --   --     Estimated Creatinine Clearance: 42.6 mL/min (A) (by C-G formula based on SCr of 1.48 mg/dL (H)).   Assessment: 26 YOF with new DVT and new CVA - pattern consistent with systemic emboli, concerning for hypercoagulable state d/t advanced malignancy. Heparin was started 3/15 after patient received one dose of Eliquis 10 mg, using aPTT to monitor heparin.   APTT = 71, therapeutic on 900 units/hr, anti-Xa level = 0.7, falsely elevated d/t eliquis as expected. Hgb/pltc are stable.  Goal of Therapy:  Heparin level 0.3 - 0.5 units/mL (will keep aPTT ~ 66-84) Monitor platelets by anticoagulation protocol: Yes  Plan:  Increase heparin rate slightly to 950 units/hr  Confirmatory aPTT at 1600 Daily heparin level, aPTT, CBC Monitor clinical course,  s/sx bleeding  Maryanna Shape, PharmD, BCPS  Clinical Pharmacist  Pager: [phone]   05/06/2017,10:35 AM   Addendum: recheck aPTT = 71 remains therapeutic.  Will continue heparin at 950 units/hr. Will f/u AM labs  Maryanna Shape, PharmD, BCPS  Clinical Pharmacist  Pager: [phone]

## 2017-05-06 NOTE — Progress Notes (Signed)
Rehab Admissions Coordinator Note:  Patient was screened by Cleatrice Burke for appropriateness for an Inpatient Acute Rehab Consult per PT and OT recommendation.  At this time, we are recommending Inpatient Rehab consult. Please place order.  Cleatrice Burke 05/06/2017, 7:37 PM  I can be reached at [phone].

## 2017-05-06 NOTE — Evaluation (Signed)
Occupational Therapy Evaluation Patient Details Name: [name] [name] MRN: [id number] DOB: 1950/05/19 Today's Date: 05/06/2017    History of Present Illness Patient is a 67 y/o female who presents with right sided weakness, dizziness, blurry vision and AMS. Head CT-subacute bil occipital infarcts. Doppler- + DVT RLE and CTA chest- + PE bilaterally. CTA- Rt ICA occlusion with thrombus. Brain MRI- Small regions of acute/early subacute infarction within right posterior parietal and left occipital lobes. Few additional punctate foci in left frontal, right parietal, and left thalamus. s/p paracentesis 3/17.  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 PMH includes schizophrenia, CAD and HTN.    Clinical Impression   PTA, pt was living with her husband and was independent. Currently, pt requiring Min A for ADLs in standing, Min A for UB ADLs, and Mod A for LB ADLs due to poor balance with noted posterior lean. Pt requiring significant time throughout session due to decreased problem solving, sequencing, ST memory, and attention. Pt presenting with visual deficits with poor tracking, decreased sustained gaze to right visual field, and deficits to left inferior visual field with pt not seeing objects till near midline. Pt motivated to return to PLOF and agreeable to participate in therapy. Pt will require further OT to increase safety and independence with ADLs and functional mobility. Due to pt's motivation, PLOF, family support, and deficits, recommend dc to CIR for intensive OT to optimize independence and safety with ADLs, IADLs, and functional mobility as well as decrease caregiver burden.     Follow Up Recommendations  CIR    Equipment Recommendations  Other (comment)(Defer to next venue)    Recommendations for Other Services PT consult;Rehab consult;Speech consult     Precautions / Restrictions Precautions Precautions: Fall Precaution Comments: watch HR Restrictions Weight Bearing  Restrictions: No      Mobility Bed Mobility Overal bed mobility: Needs Assistance Bed Mobility: Supine to Sit     Supine to sit: Supervision;HOB elevated     General bed mobility comments: OOB with PT upon arrival  Transfers Overall transfer level: Needs assistance Equipment used: None Transfers: Sit to/from Stand Sit to Stand: Min assist Stand pivot transfers: Min assist       General transfer comment: Min A for safety and balance. Min A for safe descent to recliner.    Balance Overall balance assessment: Needs assistance Sitting-balance support: Feet supported;No upper extremity supported Sitting balance-Leahy Scale: Fair     Standing balance support: During functional activity Standing balance-Leahy Scale: Poor Standing balance comment: Requires Min A for standing balance with pericare.                            ADL either performed or assessed with clinical judgement   ADL Overall ADL's : Needs assistance/impaired Eating/Feeding: Set up;Sitting   Grooming: Oral care;Minimal assistance;Standing Grooming Details (indicate cue type and reason): Pt with two LOB due to posterior lean and required Min A to correct balance. Pt demonstrating poor cognition and required significant amount of time to complete task. Pt demosntrating poor working memory as seen by forgetting whether to push or pull faucet to turn on/off. Pt repeating process several times with poor recalling of whether to push or pull.  Upper Body Bathing: Minimal assistance;Sitting   Lower Body Bathing: Moderate assistance;Sit to/from stand   Upper Body Dressing : Minimal assistance;Sitting   Lower Body Dressing: Moderate assistance;Sit to/from stand   Toilet Transfer: Minimal assistance;Ambulation(Simulated to recliner. ) Toilet  Transfer Details (indicate cue type and reason): Pt requiring Min A for balance. Noted posterior lean throughout session         Functional mobility during ADLs:  Minimal assistance;Cueing for safety General ADL Comments: Pt with decreased functional performance due to deficits in vision, cognition, and balance.     Vision Baseline Vision/History: Wears glasses Wears Glasses: At all times Patient Visual Report: Blurring of vision Vision Assessment?: Yes;Vision impaired- to be further tested in functional context Tracking/Visual Pursuits: Decreased smoothness of horizontal tracking;Decreased smoothness of vertical tracking;Requires cues, head turns, or add eye shifts to track;Unable to hold eye position out of midline;Impaired - to be further tested in functional context Convergence: Impaired - to be further tested in functional context Visual Fields: Impaired-to be further tested in functional context;Left inferior homonymous quadranopsia Depth Perception: Undershoots Additional Comments: Pt with poor smooth tracking. Unable to hold gaze to right visual field and quickly returns to midline. Pt with visual fatigue and required cues throughout tracking task to maintain head position and gaze on object. When testing peripheral vision, pt not seeing object from left inferior field until close to midline. During reading task, pt demonstrating scannign skills to transition towards left side of menu with significant amount of time.      Perception     Praxis      Pertinent Vitals/Pain Pain Assessment: No/denies pain     Hand Dominance Right   Extremity/Trunk Assessment Upper Extremity Assessment Upper Extremity Assessment: LUE deficits/detail;RUE deficits/detail RUE Deficits / Details: Poor coorindation and slow moving. Able to perform finger-to nose test with significant amount of time. Decreased grasp strength (Lweaker than R). Poor isolation of movement. RUE Coordination: decreased fine motor;decreased gross motor LUE Deficits / Details: Poor coorindation and slow moving. Able to perform finger-to nose test with significant amount of time. Decreased  grasp strength (Lweaker than R). Poor isolation of movement. LUE Coordination: decreased fine motor;decreased gross motor   Lower Extremity Assessment Lower Extremity Assessment: Defer to PT evaluation RLE Deficits / Details: Grossly ~1/5 DF/PF, 3+/5 knee extension/flexion RLE Sensation: WNL LLE Deficits / Details: Grossly ~4/5 throughout.  LLE Sensation: WNL       Communication Communication Communication: No difficulties   Cognition Arousal/Alertness: Awake/alert Behavior During Therapy: Flat affect Overall Cognitive Status: Impaired/Different from baseline Area of Impairment: Memory;Problem solving;Following commands;Attention;Awareness                   Current Attention Level: Sustained Memory: Decreased short-term memory Following Commands: Follows one step commands with increased time;Follows multi-step commands inconsistently;Follows one step commands inconsistently   Awareness: Emergent Problem Solving: Slow processing;Requires verbal cues General Comments: A&Ox4. Delayed processing and response time to questions asked sometimes requiring repetition. easily distracted. With decreased attention and required cues to maintain attention during testing and funcitonal tasks. Pt with decreased problem solving and working memory as seen during grooming at sink. Pt attempting to turn on water by pushing and required significant amount of time to problem solve she needed to pull. After she turned on cold water, she wanted to switch to warm water. Required significant amount of time to re-problem solve that she neede to pull (not push) warm water. During reading task, pt requiring increased time problem solve opening menu and then locating desired menu item.    General Comments  Daughter/spouse present during session.    Exercises     Shoulder Instructions      Home Living Family/patient expects to be discharged to:: Private residence  Living Arrangements: Spouse/significant  other Available Help at Discharge: Family;Available 24 hours/day Type of Home: House Home Access: Level entry     Home Layout: One level     Bathroom Shower/Tub: Teacher, early years/pre: Standard     Home Equipment: None      Lives With: Spouse    Prior Functioning/Environment Level of Independence: Independent        Comments: Drives. Not working. Wears glasses but needs a new prescription.        OT Problem List: Decreased strength;Decreased range of motion;Decreased activity tolerance;Impaired balance (sitting and/or standing);Impaired vision/perception;Decreased coordination;Decreased cognition;Decreased safety awareness;Decreased knowledge of use of DME or AE;Decreased knowledge of precautions      OT Treatment/Interventions: Self-care/ADL training;Therapeutic exercise;Energy conservation;DME and/or AE instruction;Therapeutic activities;Patient/family education    OT Goals(Current goals can be found in the care plan section) Acute Rehab OT Goals Patient Stated Goal: to return to PLOF OT Goal Formulation: With patient Time For Goal Achievement: 05/20/17 Potential to Achieve Goals: Good ADL Goals Pt Will Perform Grooming: with modified independence;standing Pt Will Perform Upper Body Dressing: with modified independence;sitting Pt Will Perform Lower Body Dressing: with modified independence;sit to/from stand Pt Will Transfer to Toilet: with modified independence;ambulating;regular height toilet Additional ADL Goal #1: Pt will demonstrate selective attention to perform ADL in distracting environment with Min cues Additional ADL Goal #2: Pt will perform three ADL tasks with Min cues for ST memory Additional ADL Goal #3: Pt will attend to 75% of object in left visual field with 1-2 VCs  OT Frequency: Min 3X/week   Barriers to D/C:            Co-evaluation              AM-PAC PT "6 Clicks" Daily Activity     Outcome Measure Help from another  person eating meals?: None Help from another person taking care of personal grooming?: A Little Help from another person toileting, which includes using toliet, bedpan, or urinal?: A Little Help from another person bathing (including washing, rinsing, drying)?: A Lot Help from another person to put on and taking off regular upper body clothing?: A Little Help from another person to put on and taking off regular lower body clothing?: A Lot 6 Click Score: 17   End of Session Equipment Utilized During Treatment: Gait belt Nurse Communication: Mobility status  Activity Tolerance: Patient tolerated treatment well Patient left: in chair;with call bell/phone within reach;with chair alarm set;with family/visitor present  OT Visit Diagnosis: Unsteadiness on feet (R26.81);Other abnormalities of gait and mobility (R26.89);Muscle weakness (generalized) (M62.81);Other symptoms and signs involving cognitive function;Low vision, both eyes (H54.2)                Time: 4481-8563 OT Time Calculation (min): 25 min Charges:  OT General Charges $OT Visit: 1 Visit OT Evaluation $OT Eval Moderate Complexity: 1 Mod OT Treatments $Self Care/Home Management : 8-22 mins G-Codes:     Cicily Bonano MSOT, OTR/L Acute Rehab Pager: [phone] Office: [place] 05/06/2017, 4:00 PM

## 2017-05-06 NOTE — Progress Notes (Signed)
PROGRESS NOTE    [name] [name]  IRS:[id number] DOB: 17-Jul-1950 DOA: 05/03/2017 PCP: Biagio Borg, MD    Brief Narrative:  67 y.o. female with medical history significant for schizophrenia, coronary artery disease, and hypertension, now presenting to the emergency department for evaluation of weakness, malaise, blurred vision, and loss of appetite.  Patient reports that she been in her usual state of health until approximately 3-4 days ago when she developed a nonspecific malaise and loss of appetite.  Pt diagnosed with metastatic carcinoma unknown type, PE/DVT, acute stroke and elevated troponin most likey due to PE.  Cardiology, Neurology, Pulmonology contacted.   Assessment & Plan:   Principal Problem:   Stroke [facility]) - Neurology managing - Most likely due to hypercoagulability secondary to metastatic carcinoma  Metastatic carcinoma - U/S paracentesis order placed and patient is s/p paracentesis. Hopes are to obtain diagnosis with cytology evaluation. Discussed with patient.   Acute deep vein thrombosis (DVT) of popliteal vein of right lower extremity (HCC)/Acute saddle pulmonary embolism with acute cor pulmonale (HCC) - Secondary to metastatic carcinoma. Continue anticoagulation with heparin. Moving forward will be challenging what to place patient on since she has metastatic carcinoma. ?Lovenox  Active Problems:   Schizophrenia ([place]) - Stable currently continue current medical regimen    Essential hypertension - Given acute stroke will discontinue metoprolol - placed hydralazine prn orders    AKI (acute kidney injury) ([place]) - Stable continue to monitor serum creatinine    Normocytic anemia   Acute lower UTI - Pt on Rocephin - urine culture non conclusive     DVT prophylaxis: (Heparin Code Status: Full Family Communication: Discussed with patient and family member at bedside with patient permission Disposition Plan: Pending results of above  problems   Consultants:   Neurology  Cardiology  Pulmonology  Interventional radiology  Procedures: Stroke workup  Antimicrobials: Rocephin   Subjective: Pt questions answered to her satisfaction.  Objective: Vitals:   05/06/17 1100 05/06/17 1110 05/06/17 1115 05/06/17 1511  BP: 128/90 135/85 134/81 116/83  Pulse:      Resp:      Temp:    98.4 F (36.9 C)  TempSrc:    Oral  SpO2:      Weight:      Height:        Intake/Output Summary (Last 24 hours) at 05/06/2017 1617 Last data filed at 05/05/2017 1700 Gross per 24 hour  Intake 85.62 ml  Output -  Net 85.62 ml   Filed Weights   05/04/17 0300  Weight: 79.8 kg (175 lb 14.8 oz)    Examination:  General exam: Appears calm and comfortable, in nad.  Respiratory system: Clear to auscultation. Respiratory effort normal. Equal chest rise.  Cardiovascular system: S1 & S2 heard, RRR. No JVD, murmurs, rubs, gallops or clicks. No pedal edema. Gastrointestinal system: Abdomen is nondistended, soft and nontender. No organomegaly or masses felt. Normal bowel sounds heard. Central nervous system: Alert and awake. Answers questions appropriately has word finding difficulty. Extremities: Symmetric 5 x 5 power. Skin: No rashes, lesions or ulcers, on limited exam. Psychiatry:  Mood & affect appropriate.     Data Reviewed: I have personally reviewed following labs and imaging studies  CBC: Recent Labs  Lab 05/03/17 2352 05/04/17 0000 05/04/17 0457 05/05/17 0535 05/06/17 0431  WBC 12.2*  --  10.9* 10.9* 12.5*  NEUTROABS 10.0*  --   --   --   --   HGB 10.3* 10.9* 10.3* 10.6* 10.7*  HCT  30.8* 32.0* 31.0* 32.2* 31.8*  MCV 80.0  --  79.5 79.7 79.3  PLT 121*  --  121* 159 671   Basic Metabolic Panel: Recent Labs  Lab 05/03/17 2352 05/04/17 0000 05/04/17 0457  NA 133* 132* 131*  K 3.7 3.7 3.8  CL 100* 98* 101  CO2 22  --  20*  GLUCOSE 132* 128* 103*  BUN 15 15 14   CREATININE 1.63* 1.50* 1.48*  CALCIUM 8.4*   --  8.3*   GFR: Estimated Creatinine Clearance: 42.6 mL/min (A) (by C-G formula based on SCr of 1.48 mg/dL (H)). Liver Function Tests: Recent Labs  Lab 05/03/17 2352  AST 18  ALT 8*  ALKPHOS 54  BILITOT 0.6  PROT 6.2*  ALBUMIN 2.4*   No results for input(s): LIPASE, AMYLASE in the last 168 hours. No results for input(s): AMMONIA in the last 168 hours. Coagulation Profile: Recent Labs  Lab 05/03/17 2352  INR 1.32   Cardiac Enzymes: Recent Labs  Lab 05/04/17 0457 05/04/17 1010 05/04/17 1653  TROPONINI 0.12* 0.13* 0.15*   BNP (last 3 results) No results for input(s): PROBNP in the last 8760 hours. HbA1C: Recent Labs    05/04/17 0457  HGBA1C 6.2*   CBG: No results for input(s): GLUCAP in the last 168 hours. Lipid Profile: Recent Labs    05/04/17 0457  CHOL 137  HDL 38*  LDLCALC 71  TRIG 139  CHOLHDL 3.6   Thyroid Function Tests: No results for input(s): TSH, T4TOTAL, FREET4, T3FREE, THYROIDAB in the last 72 hours. Anemia Panel: Recent Labs    05/04/17 0457  VITAMINB12 114*  FOLATE 4.4*  FERRITIN 528*  TIBC 167*  IRON 16*  RETICCTPCT 1.3   Sepsis Labs: No results for input(s): PROCALCITON, LATICACIDVEN in the last 168 hours.  Recent Results (from the past 240 hour(s))  Urine Culture     Status: Abnormal   Collection Time: 05/04/17  7:25 PM  Result Value Ref Range Status   Specimen Description URINE, RANDOM  Final   Special Requests NONE  Final   Culture (A)  Final    <10,000 COLONIES/mL INSIGNIFICANT GROWTH Performed at [facility], [address] [place], [place] [address]    Report Status 05/05/2017 FINAL  Final  Gram stain     Status: None   Collection Time: 05/06/17 11:48 AM  Result Value Ref Range Status   Specimen Description ASCITIC  Final   Special Requests NONE  Final   Gram Stain   Final    MODERATE WBC PRESENT, PREDOMINANTLY MONONUCLEAR NO ORGANISMS SEEN Performed at [facility], [place] [address]  [place], [place] [address]    Report Status 05/06/2017 FINAL  Final         Radiology Studies: Ct Angio Chest Pe W Or Wo Contrast  Result Date: 05/04/2017 CLINICAL DATA:  67 year old female with history of acute deep venous thrombosis today. EXAM: CT ANGIOGRAPHY CHEST WITH CONTRAST TECHNIQUE: Multidetector CT imaging of the chest was performed using the standard protocol during bolus administration of intravenous contrast. Multiplanar CT image reconstructions and MIPs were obtained to evaluate the vascular anatomy. CONTRAST:  21mL ISOVUE-370 IOPAMIDOL (ISOVUE-370) INJECTION 76% COMPARISON:  No priors. FINDINGS: Cardiovascular: In the distal left main pulmonary artery extending into lobar, segmental and subsegmental sized branches throughout the left lung there is a large filling defect, compatible with pulmonary embolism. This appears to be predominantly nonocclusive, and some of the filling defects are centrally located while others are more  eccentric, suggesting a combination of both acute and chronic thrombus. In addition, in the distal right main pulmonary artery there is a small filling defect, compatible with additional embolism. Heart size is upper limits of normal, however, right ventricle appears dilated when compared with the (right ventricular diameter of 44 mm versus 38 mm on the left (RV to LV ratio of 1.16)). There is no significant pericardial fluid, thickening or pericardial calcification. Mediastinum/Nodes: No pathologically enlarged mediastinal or hilar lymph nodes. Esophagus is unremarkable in appearance. No axillary lymphadenopathy. Lungs/Pleura: Several scattered small 2-5 mm pulmonary nodules are noted throughout the lungs bilaterally. Patchy areas of ground-glass attenuation in the lungs bilaterally interspersed with areas of lucency, which could reflect heterogeneous perfusion of the lungs in the setting of pulmonary embolism. No confluent consolidative airspace disease. Trace bilateral  pleural effusions lying dependently. Upper Abdomen: Diffuse low attenuation throughout the visualized hepatic parenchyma, indicative of hepatic steatosis. Large volume of ascites incompletely imaged. Nodularity throughout the omentum, concerning for intraperitoneal spread of malignancy. Left kidney is severely atrophic. Musculoskeletal: There are no aggressive appearing lytic or blastic lesions noted in the visualized portions of the skeleton. Review of the MIP images confirms the above findings. IMPRESSION: 1. Study is positive for a large burden of what appears to be both chronic thrombus an acute pulmonary embolism in the lungs bilaterally (left much greater than right), with CT evidence of right heart strain (RV/LV Ratio = 1.16) consistent with at least submassive (intermediate risk) PE. The presence of right heart strain has been associated with an increased risk of morbidity and mortality. Please activate Code PE by paging [phone]. 2. Multiple small pulmonary nodules scattered throughout the lungs bilaterally measuring 2-5 mm in size. These are nonspecific, but given the findings in the abdomen, these are concerning for potential metastatic lesions. Attention on follow-up studies is recommended. 3. Trace bilateral pleural effusions lying dependently. 4. Large volume of ascites in the abdomen is likely malignant given the apparent omental nodularity. Further evaluation with dedicated CT the abdomen and pelvis with IV contrast is recommended in the near future to evaluate for source of primary malignancy. Critical Value/emergent results were called by telephone at the time of interpretation on 05/04/2017 at 7:34 pm to nurse Jenny Reichmann for Dr. Landis Gandy, who verbally acknowledged these results. Electronically Signed   By: Vinnie Langton M.D.   On: 05/04/2017 19:35   Mr Brain Wo Contrast  Result Date: 05/04/2017 CLINICAL DATA:  67 y/o F; stroke follow-up. Right-sided weakness and blurred vision. EXAM:  MRI HEAD WITHOUT CONTRAST TECHNIQUE: Axial DWI, coronal DWI, sagittal T1 FLAIR, axial T2 FLAIR, and axial SWAN sequences were acquired. COMPARISON:  05/04/2017 CT of head and CTA of head. FINDINGS: Brain: Small regions of reduced diffusion in right posterior parietal and left occipital lobes compatible with acute/early subacute infarction. Additionally there are a few punctate foci with reduced diffusion on ADC in left frontal and right parietal lobes as well as left thalamus. There are numerous additional foci of hyperintensity scattered throughout the supratentorial and infratentorial brain with intermediate to increased diffusion on ADC compatible with late subacute to chronic infarctions. Curvilinear susceptibility hypointensity within the right posterior parietal acute infarction is compatible with petechial hemorrhage. No additional susceptibility hypointensity to indicate intracranial hemorrhage. No hydrocephalus or significant mass effect. Areas of infarction are associated with T2 FLAIR hyperintense signal abnormality and there is a background of moderate chronic microvascular ischemic changes of the brain. Vascular: Loss of flow void in right petrous and cavernous  ICA with known occlusion. Skull and upper cervical spine: Normal marrow signal. Sinuses/Orbits: Negative. Other: None. IMPRESSION: 1. Small regions of acute/early subacute infarction within right posterior parietal and left occipital lobes stable from prior CT given differences in technique. Few additional punctate foci in left frontal, right parietal, and left thalamus. 2. Petechial hemorrhage associated with right posterior parietal infarction. 3. Multiple additional subcentimeter foci of late subacute to chronic infarction scattered throughout the supratentorial and infratentorial brain. 4. Background of moderate chronic microvascular ischemic changes of the brain. Electronically Signed   By: Kristine Garbe M.D.   On: 05/04/2017  21:05   US Paracentesis  Result Date: 05/06/2017 INDICATION: Ascites EXAM: ULTRASOUND-GUIDED PARACENTESIS COMPARISON:  None. MEDICATIONS: 10 cc 1% lidocaine. COMPLICATIONS: None immediate. TECHNIQUE: Informed written consent was obtained from the patient after a discussion of the risks, benefits and alternatives to treatment. A timeout was performed prior to the initiation of the procedure. Initial ultrasound scanning demonstrates a large amount of ascites within the left lower abdominal quadrant. The left lower abdomen was prepped and draped in the usual sterile fashion. 1% lidocaine with epinephrine was used for local anesthesia. Under direct ultrasound guidance, a 19 gauge, 7-cm, Yueh catheter was introduced. An ultrasound image was saved for documentation purposed. The paracentesis was performed. The catheter was removed and a dressing was applied. The patient tolerated the procedure well without immediate post procedural complication. FINDINGS: A total of approximately 3.8 liters of yellow fluid was removed. Samples were sent to the laboratory as requested by the clinical team. IMPRESSION: Successful ultrasound-guided paracentesis yielding 3.8 liters of peritoneal fluid. Read by Lavonia Drafts [facility] Electronically Signed   By: Jerilynn Mages.  Shick M.D.   On: 05/06/2017 12:03    Scheduled Meds: . aspirin EC  81 mg Oral Daily  . benztropine  2 mg Oral Daily  . escitalopram  5 mg Oral Daily  . haloperidol  15 mg Oral Daily  . lidocaine (PF)      . pravastatin  40 mg Oral q1800  . vitamin B-12  1,000 mcg Oral Daily   Continuous Infusions: . cefTRIAXone (ROCEPHIN)  IV 1 g (05/06/17 0518)  . heparin 950 Units/hr (05/06/17 1241)     LOS: 2 days    Time spent: > 40 minutes  Velvet Bathe, MD [facility] Pager [phone]  If 7PM-7AM, please contact night-coverage [email or web address] Password [facility] 05/06/2017, 4:17 PM

## 2017-05-07 ENCOUNTER — Encounter (HOSPITAL_COMMUNITY)
Admission: EM | Disposition: A | Discharge status: Rehabilitation Facility | Payer: Self-pay | Source: Home / Self Care | Attending: Family Medicine

## 2017-05-07 DIAGNOSIS — N179 Acute kidney failure, unspecified: Secondary | ICD-10-CM

## 2017-05-07 DIAGNOSIS — I639 Cerebral infarction, unspecified: Principal | ICD-10-CM

## 2017-05-07 DIAGNOSIS — G8191 Hemiplegia, unspecified affecting right dominant side: Secondary | ICD-10-CM

## 2017-05-07 LAB — CBC
HEMATOCRIT: 30.6 % — AB (ref 36.0–46.0)
HEMOGLOBIN: 10.3 g/dL — AB (ref 12.0–15.0)
MCH: 26.3 pg (ref 26.0–34.0)
MCHC: 33.7 g/dL (ref 30.0–36.0)
MCV: 78.1 fL (ref 78.0–100.0)
Platelets: 333 10*3/uL (ref 150–400)
RBC: 3.92 MIL/uL (ref 3.87–5.11)
RDW: 16.1 % — AB (ref 11.5–15.5)
WBC: 12.9 10*3/uL — ABNORMAL HIGH (ref 4.0–10.5)

## 2017-05-07 LAB — AMYLASE, PLEURAL OR PERITONEAL FLUID: Amylase, Fluid: 58 U/L

## 2017-05-07 LAB — HEPARIN LEVEL (UNFRACTIONATED): HEPARIN UNFRACTIONATED: 0.48 [IU]/mL (ref 0.30–0.70)

## 2017-05-07 LAB — APTT: APTT: 91 s — AB (ref 24–36)

## 2017-05-07 SURGERY — ECHOCARDIOGRAM, TRANSESOPHAGEAL
Anesthesia: Moderate Sedation

## 2017-05-07 MED ORDER — TRAMADOL HCL 50 MG PO TABS
50.0000 mg | ORAL_TABLET | Freq: Four times a day (QID) | ORAL | Status: DC | PRN
  Administered 2017-05-07: 100 mg via ORAL
  Administered 2017-05-08: 50 mg via ORAL
  Filled 2017-05-07: qty 2
  Filled 2017-05-07: qty 1

## 2017-05-07 NOTE — Progress Notes (Signed)
Physical Therapy Treatment Patient Details Name: [name] [name] MRN: [id number] DOB: [birth year] Today's Date: 05/07/2017    History of Present Illness Patient is a 67 y/o female who presents with right sided weakness, dizziness, blurry vision and AMS. Head CT-subacute bil occipital infarcts. Doppler- + DVT RLE and CTA chest- + PE bilaterally. CTA- Rt ICA occlusion with thrombus. Brain MRI- Small regions of acute/early subacute infarction within right posterior parietal and left occipital lobes. Few additional punctate foci in left frontal, right parietal, and left thalamus. s/p paracentesis 3/17.  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 PMH includes schizophrenia, CAD and HTN.     PT Comments    Patient progressing well towards PT goals. Balance seems improved today but pt with significant cognitive deficits noted. Difficulty with dual tasking- counting down by 7s from 100 during gait; balance worsens and RLE drags when performing tasks. Difficulty with object naming in categories needing to stop walking to answer. HR ranged from 115-129 bpm. Continues to be a great CIR candidate. Will follow.   Follow Up Recommendations  CIR;Supervision for mobility/OOB     Equipment Recommendations  None recommended by PT    Recommendations for Other Services       Precautions / Restrictions Precautions Precautions: Fall Precaution Comments: watch HR Restrictions Weight Bearing Restrictions: No    Mobility  Bed Mobility Overal bed mobility: Needs Assistance Bed Mobility: Supine to Sit;Sit to Supine     Supine to sit: Supervision;HOB elevated Sit to supine: Supervision;HOB elevated   General bed mobility comments: No assist needed.   Transfers Overall transfer level: Needs assistance Equipment used: None Transfers: Sit to/from Stand Sit to Stand: Min guard         General transfer comment: Min guard for safety. Stood from [employer]. Not using RUE to help push to  stand.  Ambulation/Gait Ambulation/Gait assistance: Min guard Ambulation Distance (Feet): 120 Feet Assistive device: None Gait Pattern/deviations: Step-through pattern;Decreased stride length;Leaning posteriorly;Narrow base of support Gait velocity: decreased Gait velocity interpretation: Below normal speed for age/gender General Gait Details: Very slow, guarded gait with stiff posture and no arm swing. Cues to increase cadence. Needed to stop multiple times during cognitive tasks. Min A at times due to posterior lean. HR ranged from 115-129 bpm.    Stairs            Wheelchair Mobility    Modified Rankin (Stroke Patients Only) Modified Rankin (Stroke Patients Only) Pre-Morbid Rankin Score: No symptoms Modified Rankin: Moderately severe disability     Balance Overall balance assessment: Needs assistance Sitting-balance support: Feet supported;No upper extremity supported Sitting balance-Leahy Scale: Good     Standing balance support: During functional activity Standing balance-Leahy Scale: Good Standing balance comment: Able to stand at sink and brush teeth with supervision for safety.                             Cognition Arousal/Alertness: Awake/alert Behavior During Therapy: Flat affect Overall Cognitive Status: Impaired/Different from baseline Area of Impairment: Attention;Problem solving;Following commands;Awareness                   Current Attention Level: Selective Memory: Decreased short-term memory Following Commands: Follows multi-step commands with increased time   Awareness: Emergent Problem Solving: Slow processing;Requires verbal cues General Comments: Faster to respond to questions today and more conversive. Difficulty with dual tasking. Not able to count down by 7s from 100 during gait- balance worsens  and RLE drags more. Difficulty naming veggies while walking as well.       Exercises      General Comments General comments  (skin integrity, edema, etc.): Spouse left during session.      Pertinent Vitals/Pain Pain Assessment: No/denies pain    Home Living                      Prior Function            PT Goals (current goals can now be found in the care plan section) Progress towards PT goals: Progressing toward goals    Frequency    Min 4X/week      PT Plan Current plan remains appropriate    Co-evaluation              AM-PAC PT "6 Clicks" Daily Activity  Outcome Measure  Difficulty turning over in bed (including adjusting bedclothes, sheets and blankets)?: None Difficulty moving from lying on back to sitting on the side of the bed? : None Difficulty sitting down on and standing up from a chair with arms (e.g., wheelchair, bedside commode, etc,.)?: None Help needed moving to and from a bed to chair (including a wheelchair)?: A Little Help needed walking in hospital room?: A Little Help needed climbing 3-5 steps with a railing? : A Little 6 Click Score: 21    End of Session Equipment Utilized During Treatment: Gait belt Activity Tolerance: Patient tolerated treatment well Patient left: in bed;with call bell/phone within reach;with bed alarm set Nurse Communication: Mobility status PT Visit Diagnosis: Muscle weakness (generalized) (M62.81);Hemiplegia and hemiparesis;Unsteadiness on feet (R26.81);Difficulty in walking, not elsewhere classified (R26.2) Hemiplegia - Right/Left: Right Hemiplegia - dominant/non-dominant: Dominant Hemiplegia - caused by: Cerebral infarction     Time: 8115-7262 PT Time Calculation (min) (ACUTE ONLY): 22 min  Charges:  $Neuromuscular Re-education: 8-22 mins                    G Codes:       Wray Kearns, PT, DPT [phone]     Marguarite Arbour A Quinnlan Abruzzo 05/07/2017, 10:37 AM

## 2017-05-07 NOTE — Progress Notes (Signed)
ANTICOAGULATION CONSULT NOTE - Follow Up Consult  Pharmacy Consult for Heparin Indication: Acute DVT/PE w/ RHS  Allergies  Allergen Reactions  . Nabumetone Rash    Patient Measurements: Height: 5\' 8"  (172.7 cm) Weight: 175 lb 14.8 oz (79.8 kg) IBW/kg (Calculated) : 63.9 Heparin Dosing Weight: 80 kg  Vital Signs: Temp: 98.2 F (36.8 C) (03/18 0801) Temp Source: Oral (03/18 0801) BP: 131/92 (03/18 0801) Pulse Rate: 112 (03/18 0801)  Labs: Recent Labs    05/04/17 1653  05/05/17 0535 05/05/17 1416  05/06/17 0431 05/06/17 0937 05/06/17 1907 05/07/17 0921  HGB  --    < > 10.6*  --   --  10.7*  --   --  10.3*  HCT  --   --  32.2*  --   --  31.8*  --   --  30.6*  PLT  --   --  159  --   --  243  --   --  333  APTT  --    < > 100* 127*   < >  --  71* 71* 91*  HEPARINUNFRC  --    < > 1.42* 1.20*  --   --  0.70  --  0.48  TROPONINI 0.15*  --   --   --   --   --   --   --   --    < > = values in this interval not displayed.    Estimated Creatinine Clearance: 42.6 mL/min (A) (by C-G formula based on SCr of 1.48 mg/dL (H)).   Assessment: 60 YOF with new DVT and new CVA - pattern consistent with systemic emboli, concerning for hypercoagulable state d/t advanced malignancy. Heparin was started 3/15 after patient received one dose of Eliquis 10 mg, using aPTT to monitor heparin.   Heparin level = 0.48, PTT = 91 seconds  Goal of Therapy:  Heparin level 0.3 - 0.5 units/mL (will keep aPTT ~ 66-84) Monitor platelets by anticoagulation protocol: Yes  Plan:  Continue heparin at 950 units / hr Daily heparin level, CBC Monitor clinical course, s/sx bleeding  Thank you Anette Guarneri, PharmD [phone]  05/07/2017,11:02 AM

## 2017-05-07 NOTE — Consult Note (Signed)
Physical Medicine and Rehabilitation Consult Reason for Consult: Right side weakness  Referring Physician:[facility]   HPI: [name] [name] is a 67 y.o. right-handed female with history of hypertension, schizophrenia.  Presented 05/04/2017 with blurred vision and right-sided weakness with decrease in appetite.  Per chart review patient lives with spouse independent prior to admission. Husband can assist as needed. Cranial CT scan showed small bilateral occipital to early subacute infarcts without hemorrhage or mass-effect.  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  A CT of the chest positive for a large burden of what appeared to be both chronic thrombus and acute pulmonary embolism in the lungs bilaterally left greater than right as well as multiple small pulmonary nodules scattered throughout the lungs bilaterally measuring 2 - 5 mm in size concerning for metastatic lesions.  Vascular study lower extremity positive for DVT right lower extremity in the femoral vein, posterior tibial veins.  MRI demonstrated multiple additional subcentimeter foci of late subacute to chronic infarcts scattered throughout the supratentorial and infratentorial brain with few additional punctate foci and left frontal, right parietal and left thalamus.  Patient was placed on intravenous heparin for pulmonary emboli.  TCD bubble study negative for PFO.  Oncology has been consulted in regards to lung mass.  Therapy evaluations have been completed and ongoing.  MD has requested physical medicine rehab consult.   Review of Systems  Constitutional: Positive for malaise/fatigue. Negative for fever.  HENT: Negative for hearing loss.   Eyes: Positive for blurred vision.  Respiratory: Positive for cough and shortness of breath.   Cardiovascular: Positive for leg swelling. Negative for chest pain and palpitations.  Gastrointestinal: Positive for constipation. Negative for  nausea and vomiting.  Genitourinary: Negative for dysuria, flank pain and hematuria.  Musculoskeletal: Positive for myalgias.  Neurological: Positive for focal weakness.  Psychiatric/Behavioral:       Schizophrenia  All other systems reviewed and are negative.  Past Medical History:  Diagnosis Date  . CTS (carpal tunnel syndrome)    bilateral  . FATIGUE 08/27/2008  . GLUCOSE INTOLERANCE 06/25/2008  . HYPERLIPIDEMIA 06/25/2008  . HYPERTENSION 06/25/2008  . Impaired glucose tolerance 06/12/2010  . SCHIZOPHRENIA 06/25/2008   Past Surgical History:  Procedure Laterality Date  . DILATION AND CURETTAGE OF UTERUS  1999   hx of after miscarriage  . s/p cervical procedure  1995   LEEP   Family History  Problem Relation Age of Onset  . Hypertension Other   . Stroke Other    Social History:  reports that she has quit smoking. She quit smokeless tobacco use about 41 years ago. She reports that she does not drink alcohol. Her drug history is not on file. Allergies:  Allergies  Allergen Reactions  . Nabumetone Rash   Medications Prior to Admission  Medication Sig Dispense Refill  . benztropine (COGENTIN) 2 MG tablet Take 1 tablet (2 mg total) by mouth daily. 90 tablet 3  . colchicine 0.6 MG tablet 1 tab by mouth as needed for gout pain and swelling with repeat 1 per hour until improved or diarrhea 40 tablet 1  . escitalopram (LEXAPRO) 5 MG tablet Take 5 mg by mouth daily.    . haloperidol (HALDOL) 10 MG tablet Take 15 mg by mouth daily. Take 1 1/2 tab by mouth daily    . lisinopril-hydrochlorothiazide (PRINZIDE,ZESTORETIC) 20-12.5 MG tablet Take 2 tablets by mouth daily. 180 tablet 3  . lovastatin (MEVACOR)  40 MG tablet Take 1 tablet (40 mg total) by mouth daily. 90 tablet 3  . aspirin 81 MG EC tablet Take 81 mg by mouth daily.        Home: Home Living Family/patient expects to be discharged to:: Private residence Living Arrangements: Spouse/significant other Available Help at Discharge:  Family, Available 24 hours/day Type of Home: House Home Access: Level entry [place]: One level Bathroom Shower/Tub: Chiropodist: [place]: None  Lives With: Spouse  Functional History: Prior Function Level of Independence: Independent Comments: Drives. Not working. Wears glasses but needs a new prescription. Functional Status:  Mobility: Bed Mobility Overal bed mobility: Needs Assistance Bed Mobility: Supine to Sit, Sit to Supine Supine to sit: Supervision, HOB elevated Sit to supine: Supervision, HOB elevated General bed mobility comments: No assist needed.  Transfers Overall transfer level: Needs assistance Equipment used: None Transfers: Sit to/from Stand Sit to Stand: Min guard Stand pivot transfers: Min assist General transfer comment: Min guard for safety. Stood from [employer]. Not using RUE to help push to stand. Ambulation/Gait Ambulation/Gait assistance: Min guard Ambulation Distance (Feet): 120 Feet Assistive device: None Gait Pattern/deviations: Step-through pattern, Decreased stride length, Leaning posteriorly, Narrow base of support General Gait Details: Very slow, guarded gait with stiff posture and no arm swing. Cues to increase cadence. Needed to stop multiple times during cognitive tasks. Min A at times due to posterior lean. HR ranged from 115-129 bpm.  Gait velocity: decreased Gait velocity interpretation: Below normal speed for age/gender    ADL: ADL Overall ADL's : Needs assistance/impaired Eating/Feeding: Set up, Sitting Grooming: Oral care, Minimal assistance, Standing Grooming Details (indicate cue type and reason): Pt with two LOB due to posterior lean and required Min A to correct balance. Pt demonstrating poor cognition and required significant amount of time to complete task. Pt demosntrating poor working memory as seen by forgetting whether to push or pull faucet to turn on/off. Pt repeating process several  times with poor recalling of whether to push or pull.  Upper Body Bathing: Minimal assistance, Sitting Lower Body Bathing: Moderate assistance, Sit to/from stand Upper Body Dressing : Minimal assistance, Sitting Lower Body Dressing: Moderate assistance, Sit to/from stand Toilet Transfer: Minimal assistance, Ambulation(Simulated to recliner. ) Toilet Transfer Details (indicate cue type and reason): Pt requiring Min A for balance. Noted posterior lean throughout session Functional mobility during ADLs: Minimal assistance, Cueing for safety General ADL Comments: Pt with decreased functional performance due to deficits in vision, cognition, and balance.  Cognition: Cognition Overall Cognitive Status: Impaired/Different from baseline Arousal/Alertness: Awake/alert Orientation Level: Oriented X4 Attention: Selective, Sustained Sustained Attention: Impaired Sustained Attention Impairment: Verbal basic, Functional basic Selective Attention: Impaired Selective Attention Impairment: Functional basic, Verbal basic Memory: Impaired Memory Impairment: Decreased recall of new information(delayed recall 0/5) Awareness: Impaired Awareness Impairment: Intellectual impairment Problem Solving: Impaired Problem Solving Impairment: Functional basic(names 2/3 ways to make $13) Executive Function: Sequencing, Decision Making, Initiating Sequencing: Impaired Sequencing Impairment: Functional basic Decision Making: Impaired Decision Making Impairment: Functional basic Initiating: Impaired Initiating Impairment: Verbal basic Safety/Judgment: Impaired Cognition Arousal/Alertness: Awake/alert Behavior During Therapy: Flat affect Overall Cognitive Status: Impaired/Different from baseline Area of Impairment: Attention, Problem solving, Following commands, Awareness Current Attention Level: Selective Memory: Decreased short-term memory Following Commands: Follows multi-step commands with increased  time Awareness: Emergent Problem Solving: Slow processing, Requires verbal cues General Comments: Faster to respond to questions today and more conversive. Difficulty with dual tasking. Not able to count down by 7s  from 100 during gait- balance worsens and RLE drags more. Difficulty naming veggies while walking as well.   Blood pressure (!) 131/92, pulse (!) 112, temperature 98.2 F (36.8 C), temperature source Oral, resp. rate 20, height 5\' 8"  (1.727 m), weight 79.8 kg (175 lb 14.8 oz), SpO2 100 %. Physical Exam  Vitals reviewed. HENT:  Head: Normocephalic.  Eyes: EOM are normal.  Neck: Normal range of motion. Neck supple. No thyromegaly present.  Cardiovascular: Normal rate and regular rhythm.  Respiratory:  Fair inspiratory effort clear to auscultation  GI: Soft. Bowel sounds are normal. She exhibits no distension.  Neurological:  Mood is flat but appropriate. She would not initiate conversation but provides her name and age. Follow simple commands. Processing delays.  Her husband was at bedside. RUE 4/5 prox to distal. RLE 4/5 prox to distal. LUE and LLE 5/5 grossly. Sensory exam grossly intact.   Skin: Skin is warm and dry.  Psychiatric:  Flat but cooperative    Results for orders placed or performed during the hospital encounter of 05/03/17 (from the past 24 hour(s))  Gram stain     Status: None   Collection Time: 05/06/17 11:48 AM  Result Value Ref Range   Specimen Description ASCITIC    Special Requests NONE    Gram Stain      MODERATE WBC PRESENT, PREDOMINANTLY MONONUCLEAR NO ORGANISMS SEEN Performed at [facility] [address] [place], [place] [address]    Report Status 05/06/2017 FINAL   Lactate dehydrogenase (pleural or peritoneal fluid)     Status: Abnormal   Collection Time: 05/06/17 12:15 PM  Result Value Ref Range   LD, Fluid 638 (H) 3 - 23 U/L   Fluid Type-FLDH Peritoneal   Body fluid cell count with differential     Status: Abnormal   Collection  Time: 05/06/17 12:15 PM  Result Value Ref Range   Fluid Type-FCT Peritoneal    Color, Fluid YELLOW YELLOW   Appearance, Fluid CLEAR CLEAR   WBC, Fluid 599 0 - 1,000 cu mm   Neutrophil Count, Fluid 33 (H) 0 - 25 %   Lymphs, Fluid 53 %   Monocyte-Macrophage-Serous Fluid 14 (L) 50 - 90 %   Eos, Fluid 0 %   Other Cells, Fluid MESOTHELIAL CELLS PRESENT %  Albumin, pleural or peritoneal fluid     Status: None   Collection Time: 05/06/17 12:15 PM  Result Value Ref Range   Albumin, Fluid 1.9 g/dL   Fluid Type-FALB Peritoneal   APTT     Status: Abnormal   Collection Time: 05/06/17  7:07 PM  Result Value Ref Range   aPTT 71 (H) 24 - 36 seconds  CBC     Status: Abnormal   Collection Time: 05/07/17  9:21 AM  Result Value Ref Range   WBC 12.9 (H) 4.0 - 10.5 K/uL   RBC 3.92 3.87 - 5.11 MIL/uL   Hemoglobin 10.3 (L) 12.0 - 15.0 g/dL   HCT 30.6 (L) 36.0 - 46.0 %   MCV 78.1 78.0 - 100.0 fL   MCH 26.3 26.0 - 34.0 pg   MCHC 33.7 30.0 - 36.0 g/dL   RDW 16.1 (H) 11.5 - 15.5 %   Platelets 333 150 - 400 K/uL  Heparin level (unfractionated)     Status: None   Collection Time: 05/07/17  9:21 AM  Result Value Ref Range   Heparin Unfractionated 0.48 0.30 - 0.70 IU/mL  APTT     Status: Abnormal  Collection Time: 05/07/17  9:21 AM  Result Value Ref Range   aPTT 91 (H) 24 - 36 seconds   US Paracentesis  Result Date: 05/06/2017 INDICATION: Ascites EXAM: ULTRASOUND-GUIDED PARACENTESIS COMPARISON:  None. MEDICATIONS: 10 cc 1% lidocaine. COMPLICATIONS: None immediate. TECHNIQUE: Informed written consent was obtained from the patient after a discussion of the risks, benefits and alternatives to treatment. A timeout was performed prior to the initiation of the procedure. Initial ultrasound scanning demonstrates a large amount of ascites within the left lower abdominal quadrant. The left lower abdomen was prepped and draped in the usual sterile fashion. 1% lidocaine with epinephrine was used for local  anesthesia. Under direct ultrasound guidance, a 19 gauge, 7-cm, Yueh catheter was introduced. An ultrasound image was saved for documentation purposed. The paracentesis was performed. The catheter was removed and a dressing was applied. The patient tolerated the procedure well without immediate post procedural complication. FINDINGS: A total of approximately 3.8 liters of yellow fluid was removed. Samples were sent to the laboratory as requested by the clinical team. IMPRESSION: Successful ultrasound-guided paracentesis yielding 3.8 liters of peritoneal fluid. Read by Lavonia Drafts [facility] Electronically Signed   By: Jerilynn Mages.  Shick M.D.   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 Does the need for close, 24 hr/day medical supervision in concert with the patient's rehab needs make it unreasonable for this patient to be served in a less intensive setting? Yes 2. Co-Morbidities requiring supervision/potential complications: AKI, HTN, shizophrenia 3. Due to bladder management, bowel management, safety, skin/wound care, disease management, medication administration, pain management and patient education, does the patient require 24 hr/day rehab nursing? Yes 4. Does the patient require coordinated care of a physician, rehab nurse, PT (1-2 hrs/day, 5 days/week), OT (1-2 hrs/day, 5 days/week) and SLP (1-2 hrs/day, 5 days/week) to address physical and functional deficits in the context of the above medical diagnosis(es)? Yes Addressing deficits in the following areas: balance, endurance, locomotion, strength, transferring, bowel/bladder control, bathing, dressing, feeding, grooming, toileting, cognition and psychosocial support 5. Can the patient actively participate in an intensive therapy program of at least 3 hrs of therapy per day at least 5 days per week? Yes 6. The potential for patient to make measurable  gains while on inpatient rehab is excellent 7. Anticipated functional outcomes upon discharge from inpatient rehab are modified independent and supervision  with PT, modified independent and supervision with OT, modified independent and supervision with SLP. 8. Estimated rehab length of stay to reach the above functional goals is: 8-11 days 9. Anticipated D/C setting: Home 10. Anticipated post D/C treatments: HH therapy and Outpatient therapy 11. Overall Rehab/Functional Prognosis: excellent  RECOMMENDATIONS: This patient's condition is appropriate for continued rehabilitative care in the following setting: CIR Patient has agreed to participate in recommended program. Yes Note that insurance prior authorization may be required for reimbursement for recommended care.  Comment: Rehab Admissions Coordinator to follow up.  Thanks,  Meredith Staggers, MD, Mellody Drown    Lavon Paganini Angiulli, PA-C 05/07/2017

## 2017-05-07 NOTE — Care Management Important Message (Signed)
Important Message  Patient Details  Name: [name] [name] MRN: [id number] Date of Birth: 30-Mar-1950   Medicare Important Message Given:  Yes    Orlo Brickle Montine Circle 05/07/2017, 12:32 PM

## 2017-05-07 NOTE — Progress Notes (Signed)
PROGRESS NOTE    [name] [name]  ERX:[id number] DOB: 11/04/1950 DOA: 05/03/2017 PCP: Biagio Borg, MD    Brief Narrative:  67 y.o. female with medical history significant for schizophrenia, coronary artery disease, and hypertension, now presenting to the emergency department for evaluation of weakness, malaise, blurred vision, and loss of appetite.  Patient reports that she been in her usual state of health until approximately 3-4 days ago when she developed a nonspecific malaise and loss of appetite.  Pt diagnosed with metastatic carcinoma unknown type, PE/DVT, acute stroke and elevated troponin most likey due to PE.  Cardiology, Neurology, Pulmonology contacted.   Assessment & Plan:   Principal Problem:   Stroke [facility]) - Neurology managing - Most likely due to hypercoagulability secondary to metastatic carcinoma  Metastatic carcinoma - U/S paracentesis order placed and patient is s/p paracentesis. Hopes are to obtain diagnosis with cytology evaluation. Discussed with patient. - Pt complaining of pain. Will add tramadol - consult CIR for inpatient rehab - Pt does not want work up, will consult palliative to assist with goals of care. Called and left message, order for consult placed    Acute deep vein thrombosis (DVT) of popliteal vein of right lower extremity (HCC)/Acute saddle pulmonary embolism with acute cor pulmonale (HCC) - Secondary to metastatic carcinoma. Continue anticoagulation with heparin. Moving forward will be challenging what to place patient on since she has metastatic carcinoma. ?Lovenox, will await discussion with palliative   Active Problems:   Schizophrenia ([place]) - Stable currently continue current medical regimen    Essential hypertension - Given acute stroke will discontinue metoprolol - placed hydralazine prn orders    AKI (acute kidney injury) ([place]) - Stable continue to monitor serum creatinine    Normocytic anemia   Acute lower UTI - Pt on  Rocephin - urine culture non conclusive     DVT prophylaxis: (Heparin Code Status: Full Family Communication: Discussed with patient and family member at bedside with patient permission Disposition Plan: Pending results of above problems   Consultants:   Neurology  Cardiology  Pulmonology  Interventional radiology  Procedures: Stroke workup  Antimicrobials: Rocephin   Subjective: No new complaints reported to me today.  Objective: Vitals:   05/06/17 2354 05/07/17 0354 05/07/17 0801 05/07/17 1215  BP: 120/84 133/83 (!) 131/92 137/83  Pulse: (!) 103 (!) 103 (!) 112 (!) 114  Resp: 16 19 20 20   Temp:   98.2 F (36.8 C) 98.2 F (36.8 C)  TempSrc:   Oral Oral  SpO2: 98% 99% 100% 99%  Weight:      Height:        Intake/Output Summary (Last 24 hours) at 05/07/2017 1504 Last data filed at 05/06/2017 1909 Gross per 24 hour  Intake -  Output 450 ml  Net -450 ml   Filed Weights   05/04/17 0300  Weight: 79.8 kg (175 lb 14.8 oz)    Examination: Exam unchanged when compared to prior on 05/06/17  General exam: Appears calm and comfortable, in nad.  Respiratory system: Clear to auscultation. Respiratory effort normal. Equal chest rise.  Cardiovascular system: S1 & S2 heard, RRR. No JVD, murmurs, rubs, gallops or clicks. No pedal edema. Gastrointestinal system: Abdomen is nondistended, soft and nontender. No organomegaly or masses felt. Normal bowel sounds heard. Central nervous system: Alert and awake. Answers questions appropriately has word finding difficulty. Extremities: Symmetric 5 x 5 power. Skin: No rashes, lesions or ulcers, on limited exam. Psychiatry:  Mood & affect appropriate.  Data Reviewed: I have personally reviewed following labs and imaging studies  CBC: Recent Labs  Lab 05/03/17 2352 05/04/17 0000 05/04/17 0457 05/05/17 0535 05/06/17 0431 05/07/17 0921  WBC 12.2*  --  10.9* 10.9* 12.5* 12.9*  NEUTROABS 10.0*  --   --   --   --   --     HGB 10.3* 10.9* 10.3* 10.6* 10.7* 10.3*  HCT 30.8* 32.0* 31.0* 32.2* 31.8* 30.6*  MCV 80.0  --  79.5 79.7 79.3 78.1  PLT 121*  --  121* 159 243 010   Basic Metabolic Panel: Recent Labs  Lab 05/03/17 2352 05/04/17 0000 05/04/17 0457  NA 133* 132* 131*  K 3.7 3.7 3.8  CL 100* 98* 101  CO2 22  --  20*  GLUCOSE 132* 128* 103*  BUN 15 15 14   CREATININE 1.63* 1.50* 1.48*  CALCIUM 8.4*  --  8.3*   GFR: Estimated Creatinine Clearance: 42.6 mL/min (A) (by C-G formula based on SCr of 1.48 mg/dL (H)). Liver Function Tests: Recent Labs  Lab 05/03/17 2352  AST 18  ALT 8*  ALKPHOS 54  BILITOT 0.6  PROT 6.2*  ALBUMIN 2.4*   No results for input(s): LIPASE, AMYLASE in the last 168 hours. No results for input(s): AMMONIA in the last 168 hours. Coagulation Profile: Recent Labs  Lab 05/03/17 2352  INR 1.32   Cardiac Enzymes: Recent Labs  Lab 05/04/17 0457 05/04/17 1010 05/04/17 1653  TROPONINI 0.12* 0.13* 0.15*   BNP (last 3 results) No results for input(s): PROBNP in the last 8760 hours. HbA1C: No results for input(s): HGBA1C in the last 72 hours. CBG: No results for input(s): GLUCAP in the last 168 hours. Lipid Profile: No results for input(s): CHOL, HDL, LDLCALC, TRIG, CHOLHDL, LDLDIRECT in the last 72 hours. Thyroid Function Tests: No results for input(s): TSH, T4TOTAL, FREET4, T3FREE, THYROIDAB in the last 72 hours. Anemia Panel: No results for input(s): VITAMINB12, FOLATE, FERRITIN, TIBC, IRON, RETICCTPCT in the last 72 hours. Sepsis Labs: No results for input(s): PROCALCITON, LATICACIDVEN in the last 168 hours.  Recent Results (from the past 240 hour(s))  Urine Culture     Status: Abnormal   Collection Time: 05/04/17  7:25 PM  Result Value Ref Range Status   Specimen Description URINE, RANDOM  Final   Special Requests NONE  Final   Culture (A)  Final    <10,000 COLONIES/mL INSIGNIFICANT GROWTH Performed at [facility], [address]  [place], [place] [address]    Report Status 05/05/2017 FINAL  Final  Culture, body fluid-bottle     Status: None (Preliminary result)   Collection Time: 05/06/17 11:48 AM  Result Value Ref Range Status   Specimen Description ASCITIC  Final   Special Requests NONE  Final   Culture   Final    NO GROWTH 1 DAY Performed at [facility], [place] [address] [place], [place] [address]    Report Status PENDING  Incomplete  Gram stain     Status: None   Collection Time: 05/06/17 11:48 AM  Result Value Ref Range Status   Specimen Description ASCITIC  Final   Special Requests NONE  Final   Gram Stain   Final    MODERATE WBC PRESENT, PREDOMINANTLY MONONUCLEAR NO ORGANISMS SEEN Performed at [facility], [address] [place], [place] [address]    Report Status 05/06/2017 FINAL  Final         Radiology Studies: US Paracentesis  Result  Date: 05/06/2017 INDICATION: Ascites EXAM: ULTRASOUND-GUIDED PARACENTESIS COMPARISON:  None. MEDICATIONS: 10 cc 1% lidocaine. COMPLICATIONS: None immediate. TECHNIQUE: Informed written consent was obtained from the patient after a discussion of the risks, benefits and alternatives to treatment. A timeout was performed prior to the initiation of the procedure. Initial ultrasound scanning demonstrates a large amount of ascites within the left lower abdominal quadrant. The left lower abdomen was prepped and draped in the usual sterile fashion. 1% lidocaine with epinephrine was used for local anesthesia. Under direct ultrasound guidance, a 19 gauge, 7-cm, Yueh catheter was introduced. An ultrasound image was saved for documentation purposed. The paracentesis was performed. The catheter was removed and a dressing was applied. The patient tolerated the procedure well without immediate post procedural complication. FINDINGS: A total of approximately 3.8 liters of yellow fluid was removed. Samples were sent to the laboratory as requested by the clinical team.  IMPRESSION: Successful ultrasound-guided paracentesis yielding 3.8 liters of peritoneal fluid. Read by Lavonia Drafts [facility] Electronically Signed   By: Jerilynn Mages.  Shick M.D.   On: 05/06/2017 12:03    Scheduled Meds: . aspirin EC  81 mg Oral Daily  . benztropine  2 mg Oral Daily  . escitalopram  5 mg Oral Daily  . haloperidol  15 mg Oral Daily  . pravastatin  40 mg Oral q1800  . vitamin B-12  1,000 mcg Oral Daily   Continuous Infusions: . cefTRIAXone (ROCEPHIN)  IV 1 g (05/06/17 0518)  . heparin 950 Units/hr (05/07/17 1128)     LOS: 3 days    Time spent: > 40 minutes  Velvet Bathe, MD [facility] Pager [phone]  If 7PM-7AM, please contact night-coverage [email or web address] Password [facility] 05/07/2017, 3:04 PM

## 2017-05-08 ENCOUNTER — Encounter (HOSPITAL_COMMUNITY): Payer: Self-pay | Admitting: Emergency Medicine

## 2017-05-08 ENCOUNTER — Inpatient Hospital Stay (HOSPITAL_COMMUNITY)
Admission: RE | Admit: 2017-05-08 | Discharge: 2017-05-13 | DRG: 057 | Disposition: A | Discharge status: Home Health | Payer: Medicare Other | Source: Intra-hospital | Attending: Physical Medicine & Rehabilitation | Admitting: Physical Medicine & Rehabilitation

## 2017-05-08 ENCOUNTER — Other Ambulatory Visit: Payer: Self-pay

## 2017-05-08 DIAGNOSIS — Z515 Encounter for palliative care: Secondary | ICD-10-CM

## 2017-05-08 DIAGNOSIS — R180 Malignant ascites: Secondary | ICD-10-CM | POA: Diagnosis not present

## 2017-05-08 DIAGNOSIS — Z823 Family history of stroke: Secondary | ICD-10-CM | POA: Diagnosis not present

## 2017-05-08 DIAGNOSIS — Z7982 Long term (current) use of aspirin: Secondary | ICD-10-CM | POA: Diagnosis not present

## 2017-05-08 DIAGNOSIS — Z86711 Personal history of pulmonary embolism: Secondary | ICD-10-CM

## 2017-05-08 DIAGNOSIS — Z87891 Personal history of nicotine dependence: Secondary | ICD-10-CM | POA: Diagnosis not present

## 2017-05-08 DIAGNOSIS — F209 Schizophrenia, unspecified: Secondary | ICD-10-CM | POA: Diagnosis present

## 2017-05-08 DIAGNOSIS — I69398 Other sequelae of cerebral infarction: Secondary | ICD-10-CM | POA: Diagnosis not present

## 2017-05-08 DIAGNOSIS — C7801 Secondary malignant neoplasm of right lung: Secondary | ICD-10-CM | POA: Diagnosis present

## 2017-05-08 DIAGNOSIS — R339 Retention of urine, unspecified: Secondary | ICD-10-CM | POA: Diagnosis present

## 2017-05-08 DIAGNOSIS — Z79899 Other long term (current) drug therapy: Secondary | ICD-10-CM | POA: Diagnosis not present

## 2017-05-08 DIAGNOSIS — I10 Essential (primary) hypertension: Secondary | ICD-10-CM | POA: Diagnosis present

## 2017-05-08 DIAGNOSIS — E785 Hyperlipidemia, unspecified: Secondary | ICD-10-CM | POA: Diagnosis present

## 2017-05-08 DIAGNOSIS — C7802 Secondary malignant neoplasm of left lung: Secondary | ICD-10-CM | POA: Diagnosis present

## 2017-05-08 DIAGNOSIS — K5900 Constipation, unspecified: Secondary | ICD-10-CM | POA: Diagnosis present

## 2017-05-08 DIAGNOSIS — C799 Secondary malignant neoplasm of unspecified site: Secondary | ICD-10-CM

## 2017-05-08 DIAGNOSIS — E871 Hypo-osmolality and hyponatremia: Secondary | ICD-10-CM | POA: Diagnosis present

## 2017-05-08 DIAGNOSIS — E8809 Other disorders of plasma-protein metabolism, not elsewhere classified: Secondary | ICD-10-CM | POA: Diagnosis present

## 2017-05-08 DIAGNOSIS — D62 Acute posthemorrhagic anemia: Secondary | ICD-10-CM | POA: Diagnosis present

## 2017-05-08 DIAGNOSIS — R188 Other ascites: Secondary | ICD-10-CM | POA: Diagnosis present

## 2017-05-08 DIAGNOSIS — I2699 Other pulmonary embolism without acute cor pulmonale: Secondary | ICD-10-CM | POA: Diagnosis not present

## 2017-05-08 DIAGNOSIS — I69318 Other symptoms and signs involving cognitive functions following cerebral infarction: Secondary | ICD-10-CM | POA: Diagnosis not present

## 2017-05-08 DIAGNOSIS — I69351 Hemiplegia and hemiparesis following cerebral infarction affecting right dominant side: Secondary | ICD-10-CM | POA: Diagnosis not present

## 2017-05-08 DIAGNOSIS — M791 Myalgia, unspecified site: Secondary | ICD-10-CM | POA: Diagnosis present

## 2017-05-08 DIAGNOSIS — Z66 Do not resuscitate: Secondary | ICD-10-CM

## 2017-05-08 DIAGNOSIS — I639 Cerebral infarction, unspecified: Secondary | ICD-10-CM | POA: Diagnosis not present

## 2017-05-08 DIAGNOSIS — D72829 Elevated white blood cell count, unspecified: Secondary | ICD-10-CM | POA: Diagnosis present

## 2017-05-08 LAB — CBC
HEMATOCRIT: 30.9 % — AB (ref 36.0–46.0)
Hemoglobin: 10.4 g/dL — ABNORMAL LOW (ref 12.0–15.0)
MCH: 26.3 pg (ref 26.0–34.0)
MCHC: 33.7 g/dL (ref 30.0–36.0)
MCV: 78.2 fL (ref 78.0–100.0)
PLATELETS: 413 10*3/uL — AB (ref 150–400)
RBC: 3.95 MIL/uL (ref 3.87–5.11)
RDW: 16.3 % — AB (ref 11.5–15.5)
WBC: 12.8 10*3/uL — AB (ref 4.0–10.5)

## 2017-05-08 LAB — BASIC METABOLIC PANEL
Anion gap: 9 (ref 5–15)
BUN: 6 mg/dL (ref 6–20)
CHLORIDE: 94 mmol/L — AB (ref 101–111)
CO2: 22 mmol/L (ref 22–32)
Calcium: 8.3 mg/dL — ABNORMAL LOW (ref 8.9–10.3)
Creatinine, Ser: 1.02 mg/dL — ABNORMAL HIGH (ref 0.44–1.00)
GFR calc non Af Amer: 57 mL/min — ABNORMAL LOW (ref >60)
GLUCOSE: 107 mg/dL — AB (ref 65–99)
POTASSIUM: 4.1 mmol/L (ref 3.5–5.1)
Sodium: 125 mmol/L — ABNORMAL LOW (ref 135–145)

## 2017-05-08 LAB — HEPARIN LEVEL (UNFRACTIONATED): Heparin Unfractionated: 0.35 IU/mL (ref 0.30–0.70)

## 2017-05-08 MED ORDER — ENOXAPARIN SODIUM 120 MG/0.8ML ~~LOC~~ SOLN
120.0000 mg | SUBCUTANEOUS | Status: DC
  Filled 2017-05-08: qty 0.8

## 2017-05-08 MED ORDER — VITAMIN B-12 1000 MCG PO TABS
1000.0000 ug | ORAL_TABLET | Freq: Every day | ORAL | Status: DC
  Administered 2017-05-09 – 2017-05-13 (×5): 1000 ug via ORAL
  Filled 2017-05-08 (×5): qty 1

## 2017-05-08 MED ORDER — ENOXAPARIN SODIUM 80 MG/0.8ML ~~LOC~~ SOLN
80.0000 mg | Freq: Two times a day (BID) | SUBCUTANEOUS | Status: DC
Start: 2017-05-08 — End: 2017-05-08

## 2017-05-08 MED ORDER — SORBITOL 70 % SOLN
30.0000 mL | Freq: Every day | Status: DC | PRN
  Administered 2017-05-08 – 2017-05-09 (×2): 30 mL via ORAL
  Filled 2017-05-08 (×2): qty 30

## 2017-05-08 MED ORDER — ESCITALOPRAM OXALATE 10 MG PO TABS
5.0000 mg | ORAL_TABLET | Freq: Every day | ORAL | Status: DC
  Administered 2017-05-09 – 2017-05-13 (×5): 5 mg via ORAL
  Filled 2017-05-08 (×5): qty 1

## 2017-05-08 MED ORDER — ASPIRIN EC 81 MG PO TBEC
81.0000 mg | DELAYED_RELEASE_TABLET | Freq: Every day | ORAL | Status: DC
  Administered 2017-05-09 – 2017-05-13 (×5): 81 mg via ORAL
  Filled 2017-05-08 (×6): qty 1

## 2017-05-08 MED ORDER — ONDANSETRON HCL 4 MG/2ML IJ SOLN: 4.0000 mg | Freq: Four times a day (QID) | INTRAMUSCULAR | Status: DC | PRN

## 2017-05-08 MED ORDER — ACETAMINOPHEN 325 MG PO TABS
650.0000 mg | ORAL_TABLET | ORAL | Status: DC | PRN
  Administered 2017-05-09 – 2017-05-11 (×4): 650 mg via ORAL
  Filled 2017-05-08 (×4): qty 2

## 2017-05-08 MED ORDER — SENNOSIDES-DOCUSATE SODIUM 8.6-50 MG PO TABS
1.0000 | ORAL_TABLET | Freq: Every evening | ORAL | Status: DC | PRN
  Administered 2017-05-09: 1 via ORAL
  Filled 2017-05-08: qty 1

## 2017-05-08 MED ORDER — ENOXAPARIN SODIUM 120 MG/0.8ML ~~LOC~~ SOLN
120.0000 mg | SUBCUTANEOUS | Status: DC
  Administered 2017-05-08: 120 mg via SUBCUTANEOUS
  Filled 2017-05-08: qty 0.8

## 2017-05-08 MED ORDER — PRAVASTATIN SODIUM 20 MG PO TABS
40.0000 mg | ORAL_TABLET | Freq: Every day | ORAL | Status: DC
  Administered 2017-05-08 – 2017-05-12 (×5): 40 mg via ORAL
  Filled 2017-05-08 (×5): qty 2

## 2017-05-08 MED ORDER — ENOXAPARIN SODIUM 120 MG/0.8ML ~~LOC~~ SOLN: 120.0000 mg | SUBCUTANEOUS | Status: DC

## 2017-05-08 MED ORDER — HALOPERIDOL 5 MG PO TABS
15.0000 mg | ORAL_TABLET | Freq: Every day | ORAL | Status: DC
  Administered 2017-05-09 – 2017-05-13 (×5): 15 mg via ORAL
  Filled 2017-05-08 (×5): qty 3

## 2017-05-08 MED ORDER — HYDRALAZINE HCL 20 MG/ML IJ SOLN: 5.0000 mg | Freq: Four times a day (QID) | INTRAMUSCULAR | Status: DC | PRN

## 2017-05-08 MED ORDER — ACETAMINOPHEN 650 MG RE SUPP: 650.0000 mg | RECTAL | Status: DC | PRN

## 2017-05-08 MED ORDER — BENZTROPINE MESYLATE 2 MG PO TABS
2.0000 mg | ORAL_TABLET | Freq: Every day | ORAL | Status: DC
  Administered 2017-05-09 – 2017-05-13 (×5): 2 mg via ORAL
  Filled 2017-05-08 (×5): qty 1

## 2017-05-08 MED ORDER — TRAMADOL HCL 50 MG PO TABS
50.0000 mg | ORAL_TABLET | Freq: Four times a day (QID) | ORAL | Status: DC | PRN
  Administered 2017-05-08 – 2017-05-11 (×7): 50 mg via ORAL
  Administered 2017-05-12: 100 mg via ORAL
  Administered 2017-05-13: 50 mg via ORAL
  Filled 2017-05-08 (×2): qty 1
  Filled 2017-05-08: qty 2
  Filled 2017-05-08 (×6): qty 1
  Filled 2017-05-08: qty 2

## 2017-05-08 MED ORDER — ENOXAPARIN SODIUM 120 MG/0.8ML ~~LOC~~ SOLN: 120.0000 mg | SUBCUTANEOUS | Status: AC

## 2017-05-08 MED ORDER — ONDANSETRON HCL 4 MG PO TABS: 4.0000 mg | ORAL_TABLET | Freq: Four times a day (QID) | ORAL | Status: DC | PRN

## 2017-05-08 MED ORDER — ACETAMINOPHEN 160 MG/5ML PO SOLN: 650.0000 mg | ORAL | Status: DC | PRN

## 2017-05-08 NOTE — Progress Notes (Signed)
Pt being discharged and admitted to inpatient rehab per orders from MD. Pt and family educated on discharge instructions. Pt verbalized understanding of instructions. All questions and concerns were addressed. Pt transferred to 4W10 via bed accompanied by staff. RN gave report to [place], Therapist, sports.

## 2017-05-08 NOTE — H&P (Signed)
Physical Medicine and Rehabilitation Admission H&P       Chief Complaint  Patient presents with  . Weakness  : HPI: [name] Wheeling Bowenis a 67 y.o.right-handedfemalewith history of hypertension, schizophrenia. 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 A CT of the chest positive for a large burden of what appeared to be both chronic thrombus and acute pulmonary embolism in the lungs bilaterally left greater than right as well as multiple small pulmonary nodules scattered throughout the lungs bilaterally measuring 2 -5 mm in size concerning for metastatic lesions. Also noted large volume of ascites in the abdomen likely malignant given the apparent omental nodularity and underwent paracentesis 05/06/2017 with a total of 3.8 L of yellow fluid removed. Vascular study lower extremity positive for DVT right lower extremity in the femoral vein, posterior tibial veins. MRI demonstrated multiple additional subcentimeter foci of late subacute to chronic infarcts scattered throughout the supratentorial and infratentorial brain with few additional punctate foci and left frontal, right parietal and left thalamus. Patient was placed on intravenous heparin for pulmonary emboli and await plan for long-term anticoagulation. TCD bubble study negative for PFO. Patient has declined any further workup for metastatic carcinoma and consultation obtained with palliative care.Therapy evaluations have been completed and ongoing. MD has requested physical medicine rehab consult. Patient was admitted for a comprehensive rehabilitation  program    Review of Systems  Constitutional: Positive for malaise/fatigue. Negative for fever.  HENT: Negative for hearing loss.   Eyes: Positive for blurred vision. Negative for photophobia.  Respiratory: Negative for cough and shortness of breath.   Cardiovascular: Positive for leg swelling. Negative for chest pain.  Gastrointestinal: Positive for constipation. Negative for nausea and vomiting.  Genitourinary: Negative for dysuria, flank pain and hematuria.  Musculoskeletal: Positive for myalgias.  Skin: Negative for rash.  Neurological: Positive for focal weakness. Negative for seizures.  Psychiatric/Behavioral:       Schizophrenia  All other systems reviewed and are negative.      Past Medical History:  Diagnosis Date  . CTS (carpal tunnel syndrome)    bilateral  . FATIGUE 08/27/2008  . GLUCOSE INTOLERANCE 06/25/2008  . HYPERLIPIDEMIA 06/25/2008  . HYPERTENSION 06/25/2008  . Impaired glucose tolerance 06/12/2010  . SCHIZOPHRENIA 06/25/2008        Past Surgical History:  Procedure Laterality Date  . DILATION AND CURETTAGE OF UTERUS  1999   hx of after miscarriage  . s/p cervical procedure  1995   LEEP        Family History  Problem Relation Age of Onset  . Hypertension Other   . Stroke Other    Social History:  reports that she has quit smoking. She quit smokeless tobacco use about 41 years ago. She reports that she does not drink alcohol. Her drug history is not on file. Allergies:      Allergies  Allergen Reactions  . Nabumetone Rash         Medications Prior to Admission  Medication Sig Dispense Refill  . benztropine (COGENTIN) 2 MG tablet Take 1 tablet (2 mg total) by mouth daily. 90 tablet 3  . colchicine 0.6 MG tablet 1 tab by mouth as needed for gout pain and swelling with repeat  1 per hour until improved or diarrhea 40 tablet 1  . escitalopram (LEXAPRO) 5 MG tablet Take 5 mg by mouth daily.    . haloperidol (HALDOL) 10 MG tablet Take 15 mg  by mouth daily. Take 1 1/2 tab by mouth daily    . lisinopril-hydrochlorothiazide (PRINZIDE,ZESTORETIC) 20-12.5 MG tablet Take 2 tablets by mouth daily. 180 tablet 3  . lovastatin (MEVACOR) 40 MG tablet Take 1 tablet (40 mg total) by mouth daily. 90 tablet 3  . aspirin 81 MG EC tablet Take 81 mg by mouth daily.        Drug Regimen Review Drug regimen was  Reviewed and remains appropriate with no significant issues identified  Home: Home Living Family/patient expects to be discharged to:: Private residence Living Arrangements: Spouse/significant other Available Help at Discharge: Family, Available 24 hours/day Type of Home: House Home Access: Level entry Home Layout: One level Bathroom Shower/Tub: Chiropodist: Standard Home Equipment: None  Lives With: Spouse   Functional History: Prior Function Level of Independence: Independent Comments: Drives. Not working. Wears glasses but needs a new prescription.  Functional Status:  Mobility: Bed Mobility Overal bed mobility: Needs Assistance Bed Mobility: Supine to Sit, Sit to Supine Supine to sit: Supervision, HOB elevated Sit to supine: Supervision, HOB elevated General bed mobility comments: No assist needed.  Transfers Overall transfer level: Needs assistance Equipment used: None Transfers: Sit to/from Stand Sit to Stand: Min guard Stand pivot transfers: Min assist General transfer comment: Min guard for safety. Stood from [employer]. Not using RUE to help push to stand. Ambulation/Gait Ambulation/Gait assistance: Min guard Ambulation Distance (Feet): 120 Feet Assistive device: None Gait Pattern/deviations: Step-through pattern, Decreased stride length, Leaning posteriorly, Narrow base of support General Gait Details: Very slow, guarded gait with stiff posture and no arm swing. Cues to increase cadence. Needed to stop multiple times during cognitive tasks. Min A at times due to posterior lean. HR  ranged from 115-129 bpm.  Gait velocity: decreased Gait velocity interpretation: Below normal speed for age/gender  ADL: ADL Overall ADL's : Needs assistance/impaired Eating/Feeding: Set up, Sitting Grooming: Oral care, Minimal assistance, Standing Grooming Details (indicate cue type and reason): Pt with two LOB due to posterior lean and required Min A to correct balance. Pt demonstrating poor cognition and required significant amount of time to complete task. Pt demosntrating poor working memory as seen by forgetting whether to push or pull faucet to turn on/off. Pt repeating process several times with poor recalling of whether to push or pull.  Upper Body Bathing: Minimal assistance, Sitting Lower Body Bathing: Moderate assistance, Sit to/from stand Upper Body Dressing : Minimal assistance, Sitting Lower Body Dressing: Moderate assistance, Sit to/from stand Toilet Transfer: Minimal assistance, Ambulation(Simulated to recliner. ) Toilet Transfer Details (indicate cue type and reason): Pt requiring Min A for balance. Noted posterior lean throughout session Functional mobility during ADLs: Minimal assistance, Cueing for safety General ADL Comments: Pt with decreased functional performance due to deficits in vision, cognition, and balance.  Cognition: Cognition Overall Cognitive Status: Impaired/Different from baseline Arousal/Alertness: Awake/alert Orientation Level: Oriented X4 Attention: Selective, Sustained Sustained Attention: Impaired Sustained Attention Impairment: Verbal basic, Functional basic Selective Attention: Impaired Selective Attention Impairment: Functional basic, Verbal basic Memory: Impaired Memory Impairment: Decreased recall of new information(delayed recall 0/5) Awareness: Impaired Awareness Impairment: Intellectual impairment Problem Solving: Impaired Problem Solving Impairment: Functional basic(names 2/3 ways to make $13) Executive Function: Sequencing,  Decision Making, Initiating Sequencing: Impaired Sequencing Impairment: Functional basic Decision Making:  Impaired Decision Making Impairment: Functional basic Initiating: Impaired Initiating Impairment: Verbal basic Safety/Judgment: Impaired Cognition Arousal/Alertness: Awake/alert Behavior During Therapy: Flat affect Overall Cognitive Status: Impaired/Different from baseline Area of Impairment: Attention, Problem solving, Following commands, Awareness Current Attention Level: Selective Memory: Decreased short-term memory Following Commands: Follows multi-step commands with increased time Awareness: Emergent Problem Solving: Slow processing, Requires verbal cues General Comments: Faster to respond to questions today and more conversive. Difficulty with dual tasking. Not able to count down by 7s from 100 during gait- balance worsens and RLE drags more. Difficulty naming veggies while walking as well.   Physical Exam: Blood pressure 126/79, pulse 99, temperature 98.3 F (36.8 C), temperature source Oral, resp. rate 18, height 5\' 8"  (1.727 m), weight 79.8 kg (175 lb 14.8 oz), SpO2 98 %. Physical Exam  Vitals reviewed. Constitutional: She appears well-nourished. No distress.  HENT:  Head: Normocephalic.  Eyes: EOM are normal. Right eye exhibits no discharge. Left eye exhibits no discharge.  Neck: Normal range of motion. Neck supple. No JVD present. No thyromegaly present.  Cardiovascular: Normal rate, regular rhythm, normal heart sounds and intact distal pulses. Exam reveals no gallop and no friction rub.  No murmur heard. Respiratory: Effort normal and breath sounds normal. No respiratory distress. She has no wheezes. She has no rales.  GI: Soft. Bowel sounds are normal. She exhibits no distension.  Musculoskeletal: She exhibits no edema.  Skin. Warm and dry Neurological:Slow to initiate conversation but provides her name and age. Follow simple commands. Processing delays. RUE 4/5  prox to distal. RLE similar at 4/5 prox to distal. Right pronator drift. LUE 5/5. LLE 5/5. Senses pain and light touch in all 4 limbs.  Psych: Flat but cooperative   LabResultsLast48Hours        Results for orders placed or performed during the hospital encounter of 05/03/17 (from the past 48 hour(s))  Heparin level (unfractionated)     Status: None   Collection Time: 05/06/17  9:37 AM  Result Value Ref Range   Heparin Unfractionated 0.70 0.30 - 0.70 IU/mL    Comment:        IF HEPARIN RESULTS ARE BELOW EXPECTED VALUES, AND PATIENT DOSAGE HAS BEEN CONFIRMED, SUGGEST FOLLOW UP TESTING OF ANTITHROMBIN III LEVELS. Performed at [facility], [place] [address] [place], [place] [address]   APTT     Status: Abnormal   Collection Time: 05/06/17  9:37 AM  Result Value Ref Range   aPTT 71 (H) 24 - 36 seconds    Comment:        IF BASELINE aPTT IS ELEVATED, SUGGEST PATIENT RISK ASSESSMENT BE USED TO DETERMINE APPROPRIATE ANTICOAGULANT THERAPY. Performed at [facility], [place] [address], [place], [place] [address]   Culture, body fluid-bottle     Status: None (Preliminary result)   Collection Time: 05/06/17 11:48 AM  Result Value Ref Range   Specimen Description ASCITIC    Special Requests NONE    Culture      NO GROWTH 1 DAY Performed at [facility], [place] [address], [place], [place] [address]    Report Status PENDING   Gram stain     Status: None   Collection Time: 05/06/17 11:48 AM  Result Value Ref Range   Specimen Description ASCITIC    Special Requests NONE    Gram Stain      MODERATE WBC PRESENT, PREDOMINANTLY MONONUCLEAR NO ORGANISMS SEEN Performed at [facility], [place],  [place] [address]    Report Status 05/06/2017 FINAL   Lactate dehydrogenase (pleural or peritoneal fluid)     Status: Abnormal   Collection Time: 05/06/17 12:15 PM  Result Value Ref Range   LD, Fluid 638 (H) 3 - 23  U/L    Comment: (NOTE) Results should be evaluated in conjunction with serum values    Fluid Type-FLDH Peritoneal     Comment: Performed at [facility], [place] [address] [place], [place] [address]  Body fluid cell count with differential     Status: Abnormal   Collection Time: 05/06/17 12:15 PM  Result Value Ref Range   Fluid Type-FCT Peritoneal    Color, Fluid YELLOW YELLOW   Appearance, Fluid CLEAR CLEAR   WBC, Fluid 599 0 - 1,000 cu mm   Neutrophil Count, Fluid 33 (H) 0 - 25 %   Lymphs, Fluid 53 %   Monocyte-Macrophage-Serous Fluid 14 (L) 50 - 90 %   Eos, Fluid 0 %   Other Cells, Fluid MESOTHELIAL CELLS PRESENT %    Comment: CLUMPS OF LARGE CELLS PRESENT Performed at [facility], [place] [address] [place], [place] [address]   Albumin, pleural or peritoneal fluid     Status: None   Collection Time: 05/06/17 12:15 PM  Result Value Ref Range   Albumin, Fluid 1.9 g/dL    Comment: (NOTE) No normal range established for this test Results should be evaluated in conjunction with serum values    Fluid Type-FALB Peritoneal     Comment: Performed at [facility], [place] [address] [place], [place] [address]  Amylase, pleural or peritoneal fluid     Status: None   Collection Time: 05/06/17 12:16 PM  Result Value Ref Range   Amylase, Fluid 58 U/L    Comment: NO NORMAL RANGE ESTABLISHED FOR THIS TEST Performed at [facility], [address], [place], [place] [address]    Fluid Type-FAMY Peritoneal     Comment: ABDOMEN Performed at [facility], [place] [address], [place], [place] [address]   APTT     Status: Abnormal   Collection Time: 05/06/17  7:07 PM  Result Value Ref Range   aPTT 71 (H) 24 - 36 seconds    Comment:        IF BASELINE aPTT IS ELEVATED, SUGGEST PATIENT RISK ASSESSMENT BE USED TO DETERMINE APPROPRIATE ANTICOAGULANT THERAPY. Performed at [facility], [place] [address]  [place], [place] [address]   CBC     Status: Abnormal   Collection Time: 05/07/17  9:21 AM  Result Value Ref Range   WBC 12.9 (H) 4.0 - 10.5 K/uL   RBC 3.92 3.87 - 5.11 MIL/uL   Hemoglobin 10.3 (L) 12.0 - 15.0 g/dL   HCT 30.6 (L) 36.0 - 46.0 %   MCV 78.1 78.0 - 100.0 fL   MCH 26.3 26.0 - 34.0 pg   MCHC 33.7 30.0 - 36.0 g/dL   RDW 16.1 (H) 11.5 - 15.5 %   Platelets 333 150 - 400 K/uL    Comment: Performed at [facility], [place] [address], [place], [place] [address]  Heparin level (unfractionated)     Status: None   Collection Time: 05/07/17  9:21 AM  Result Value Ref Range   Heparin Unfractionated 0.48 0.30 - 0.70 IU/mL    Comment:        IF HEPARIN RESULTS ARE BELOW EXPECTED VALUES, AND PATIENT DOSAGE HAS BEEN CONFIRMED, SUGGEST FOLLOW  UP TESTING OF ANTITHROMBIN III LEVELS. Performed at [facility], [place] [address], [place], [place] [address]   APTT     Status: Abnormal   Collection Time: 05/07/17  9:21 AM  Result Value Ref Range   aPTT 91 (H) 24 - 36 seconds    Comment:        IF BASELINE aPTT IS ELEVATED, SUGGEST PATIENT RISK ASSESSMENT BE USED TO DETERMINE APPROPRIATE ANTICOAGULANT THERAPY. Performed at [facility], [place] [address] [place], [place] [address]       ImagingResults(Last48hours)  US Paracentesis  Result Date: 05/06/2017 INDICATION: Ascites EXAM: ULTRASOUND-GUIDED PARACENTESIS COMPARISON:  None. MEDICATIONS: 10 cc 1% lidocaine. COMPLICATIONS: None immediate. TECHNIQUE: Informed written consent was obtained from the patient after a discussion of the risks, benefits and alternatives to treatment. A timeout was performed prior to the initiation of the procedure. Initial ultrasound scanning demonstrates a large amount of ascites within the left lower abdominal quadrant. The left lower abdomen was prepped and draped in the usual sterile fashion. 1% lidocaine with epinephrine was used for local anesthesia. Under direct  ultrasound guidance, a 19 gauge, 7-cm, Yueh catheter was introduced. An ultrasound image was saved for documentation purposed. The paracentesis was performed. The catheter was removed and a dressing was applied. The patient tolerated the procedure well without immediate post procedural complication. FINDINGS: A total of approximately 3.8 liters of yellow fluid was removed. Samples were sent to the laboratory as requested by the clinical team. IMPRESSION: Successful ultrasound-guided paracentesis yielding 3.8 liters of peritoneal fluid. Read by Lavonia Drafts [facility] Electronically Signed   By: Jerilynn Mages.  Shick M.D.   On: 05/06/2017 12:03        Medical Problem List and Plan: 1.  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  DVT Prophylaxis/Anticoagulation/bilateral pulmonary emboli left greater than right and right lower extremity femoral vein, posterior tibial vein DVT.: Presently on heparin 3. Pain Management:  Tramadol as needed 4. Mood/ schizophrenia: Lexapro 5 mg daily, Haldol 15 mg daily, cogent and 2 mg daily 5. Neuropsych: This patient is capable of making decisions on her own behalf. 6. Skin/Wound Care: Routine skin checks 7. Fluids/Electrolytes/Nutrition: Routine I&O's with follow-up chemistries 8.Metastatic carcinoma. Patient declines any further workup at this time. Palliative care consulted 9.Ascites. Status post paracentesis 05/06/2017 10. Hyperlipidemia. Pravachol    Post Admission Physician Evaluation: 1. 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 . 2. Patient is admitted to receive collaborative, interdisciplinary care between the physiatrist, rehab nursing staff, and therapy team. 3. Patient's level of medical complexity and substantial therapy needs in context of that medical necessity cannot be provided at a lesser intensity of care  such as a SNF. 4. Patient has experienced substantial functional loss from his/her baseline which was documented above under the "Functional History" and "Functional Status" headings.  Judging by the patient's diagnosis, physical exam, and functional history, the patient has potential for functional progress which will result in measurable gains while on inpatient rehab.  These gains will be of substantial and practical use upon discharge  in facilitating mobility and self-care at the household level. 5. Physiatrist will provide 24 hour management of medical needs as well as oversight of the therapy plan/treatment and provide guidance as appropriate regarding the interaction of the two. 6. The Preadmission Screening has been reviewed  and patient status is unchanged unless otherwise stated above. 7. 24 hour rehab nursing will assist with bladder management, bowel management, safety, skin/wound care, disease management, medication administration, pain management and patient education  and help integrate therapy concepts, techniques,education, etc. 8. PT will assess and treat for/with: Lower extremity strength, range of motion, stamina, balance, functional mobility, safety, adaptive techniques and equipment, NMR, cognitive perceptual awareness.   Goals are: mod I to supervision. 9. OT will assess and treat for/with: ADL's, functional mobility, safety, upper extremity strength, adaptive techniques and equipment, NMR, cognitive perceptual awareness, family ed.   Goals are: supervision to mod I. Therapy may proceed with showering this patient. 10. SLP will assess and treat for/with: cognition, communication, family education.  Goals are: mod I to supervision. 11. Case Management and Social Worker will assess and treat for psychological issues and discharge planning. 12. Team conference will be held weekly to assess progress toward goals and to determine barriers to discharge. 13. Patient will receive at least 3  hours of therapy per day at least 5 days per week. 14. ELOS: 8-11 days       15. Prognosis:  excellent     Meredith Staggers, MD, [facility] 05/08/2017  Lavon Paganini Lynn, PA-C 05/08/2017

## 2017-05-08 NOTE — H&P (Deleted)
  The note originally documented on this encounter has been moved the the encounter in which it belongs.  

## 2017-05-08 NOTE — IPOC Note (Signed)
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[place])   Acute blood loss anemia   Hypoalbuminemia   Hyponatremia   Metastatic carcinoma ([place])     Functional Problem List: Nursing Behavior, Bladder, Bowel, Pain, Safety, Skin Integrity  PT Balance, Behavior, Endurance, Motor, Safety  OT Balance, Endurance, Motor  SLP    TR         Basic ADL's: OT Grooming, Bathing, Dressing, Toileting     Advanced  ADL's: OT Laundry     Transfers: PT Bed Mobility, Bed to Chair, Car, [employer], Futures trader, Metallurgist: PT Ambulation, Stairs     Additional Impairments: OT Fuctional Use of Upper Extremity  SLP        TR      Anticipated Outcomes Item Anticipated Outcome  Self Feeding independent  Swallowing      Basic self-care  modified independent  Toileting  modified independent   Bathroom Transfers modified independent  Bowel/Bladder  LBM 3/15, CONT B/B  will assist with bath needs. bowel regimen as needed.   Transfers  modI  Locomotion  S overall, no AD  Communication     Cognition     Pain  Pain tolerable less 4 (chest), will administer pain regimen as ordered.   Safety/Judgment  Will remain free from falls/injures. Call light at hand, proper foot wear.    Therapy Plan: PT Intensity: Minimum of 1-2 x/day ,45 to 90 minutes PT Frequency: 5 out of 7 days PT Duration Estimated Length of Stay: 4-6 days OT Intensity: Minimum of 1-2 x/day, 45 to 90 minutes OT Frequency: 5 out of 7 days OT Duration/Estimated Length of Stay: 4-6 days      Team Interventions: Nursing Interventions Patient/Family Education, Bladder Management, Bowel Management, Pain Management  PT interventions Visual/perceptual remediation/compensation, Therapeutic Exercise, UE/LE Strength taining/ROM, UE/LE Coordination  activities, Stair training, Patient/family education, Community reintegration, Cognitive remediation/compensation, Neuromuscular re-education, Disease management/prevention, Training and development officer, Ambulation/gait training, Discharge planning, Functional mobility training, Psychosocial support, Therapeutic Activities, Pain management  OT Interventions Balance/vestibular training, Discharge planning, Self Care/advanced ADL retraining, Therapeutic Activities, UE/LE Coordination activities, Therapeutic Exercise, Patient/family education, Functional mobility training, Cognitive remediation/compensation, Community reintegration, Engineer, drilling, Neuromuscular re-education, UE/LE Strength taining/ROM, Visual/perceptual remediation/compensation  SLP Interventions    TR Interventions    SW/CM Interventions Discharge Planning, Psychosocial Support, Patient/Family Education   Barriers to Discharge MD  Medical stability  Nursing      PT      OT      SLP      SW       Team Discharge Planning: Destination: PT-Home ,OT- Home , SLP-  Projected Follow-up: PT-Outpatient PT, OT-  None, SLP-None Projected Equipment Needs: PT-None recommended by PT, OT- To be determined, SLP-None recommended by SLP Equipment Details: PT- , OT-  Patient/family involved in discharge planning: PT- Patient, Family member/caregiver,  OT-Patient, Family member/caregiver, SLP-Patient, Family member/caregiver  MD ELOS: 3-5 days. Medical Rehab Prognosis:  Guarded  Assessment: 67 y.o.right-handedfemalewith history of hypertension, schizophrenia. 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 A CT of the chest positive for a large burden of what  appeared to be both chronic thrombus and  acute pulmonary embolism in the lungs bilaterally left greater than right as well as multiple small pulmonary nodules scattered throughout the lungs bilaterally measuring 2 -5 mm in size concerning for metastatic lesions.Also noted large volume of ascites in the abdomen likely malignant given the apparent omental nodularity and underwent paracentesis 05/06/2017 with a total of 3.8 L of yellow fluid removed.Vascular study lower extremity positive for DVT right lower extremity in the femoral vein, posterior tibial veins. MRI demonstrated multiple additional subcentimeter foci of late subacute to chronic infarcts scattered throughout the supratentorial and infratentorial brain with few additional punctate foci and left frontal, right parietal and left thalamus. Patient was placed on intravenous heparin for pulmonary emboliand await plan for long-term anticoagulation. Echo bubble study negative for PFO.Patient has declined any further workup for metastatic carcinoma and consultation obtained with palliative care.Patient with functional deficits with mobility, self-care, endurance.  Will set goals for Mod I with PT/OT.  See Team Conference Notes for weekly updates to the plan of care

## 2017-05-08 NOTE — Progress Notes (Signed)
ANTICOAGULATION CONSULT NOTE - Follow Up Consult  Pharmacy Consult for Heparin Indication: Acute DVT/PE w/ RHS  Allergies  Allergen Reactions  . Nabumetone Rash    Patient Measurements: Height: 5\' 8"  (172.7 cm) Weight: 175 lb 14.8 oz (79.8 kg) IBW/kg (Calculated) : 63.9 Heparin Dosing Weight: 80 kg  Vital Signs: Temp: 98.3 F (36.8 C) (03/19 0438) Temp Source: Oral (03/19 0438) BP: 126/79 (03/19 0438) Pulse Rate: 99 (03/19 0438)  Labs: Recent Labs    05/06/17 0431 05/06/17 0937 05/06/17 1907 05/07/17 0921 05/08/17 0547  HGB 10.7*  --   --  10.3* 10.4*  HCT 31.8*  --   --  30.6* 30.9*  PLT 243  --   --  333 413*  APTT  --  71* 71* 91*  --   HEPARINUNFRC  --  0.70  --  0.48 0.35    Estimated Creatinine Clearance: 42.6 mL/min (A) (by C-G formula based on SCr of 1.48 mg/dL (H)).   Assessment: 35 YOF with new DVT and new CVA - pattern consistent with systemic emboli, concerning for hypercoagulable state d/t advanced malignancy. Heparin was started 3/15 after patient received one dose of Eliquis 10 mg, using aPTT to monitor heparin.   Heparin level = 0.35  Goal of Therapy:  Heparin level 0.3 - 0.5 units/mL (will keep aPTT ~ 66-84) Monitor platelets by anticoagulation protocol: Yes  Plan:  Continue heparin at 950 units / hr Daily heparin level, CBC Monitor clinical course, s/sx bleeding  Thank you Anette Guarneri, PharmD [phone]  05/08/2017,8:52 AM

## 2017-05-08 NOTE — Consult Note (Signed)
[facility] CM Primary Care Navigator  05/08/2017  [name] [name] [birth year] [id number]   Met with patient and husband [name] [name]) at the bedsideto identify possible discharge needs. Patient and husbandreports thatshe had dizzy spells, chest pain, right side weakness and slurred speech thathad resultedtothis admission.  Patient's husbandendorses Dr. [facility] the primary [facility].    [facility] in [place] and [employer] on [address] obtain medications withoutdifficulty.  Patient reports that [facility]" [facility] filled once a week.  Patient's husbandis providing transportationto herdoctors'appointments.  Patient liveswithhusband at home who is the primary caregiver as stated.  [facility]commend[facility].  Patient and husbandvoiced understanding to call primary care provider's office whenpatientgetsback home for a post discharge follow-up visit within1- 2 weeksor sooner if needs arise. Patient letter (with PCP's contact number) was provided asareminder.   Discussed with patient and husbandregarding [facility] CM services available for health managementand resourcesat home but indicated having no needs or concerns at this point.  Both expressedunderstandingto seekreferralfrom primary care provider to [facility] care management ifdeemed necessary and appropriatefor any services in thefuture- onceshe returns back home.  [facility] care management information was provided for future needs thatpatient may have.  Primary care provider's office is listed as providing transition of care (TOC) follow-up.   For additional questions please contact:  Edwena Felty A. Duval Macleod, BSN, RN-BC [facility] PRIMARY CARE Navigator Cell: [phone]

## 2017-05-08 NOTE — H&P (Signed)
Physical Medicine and Rehabilitation Admission H&P    Chief Complaint  Patient presents with  . Weakness  : HPI: [name] [name] is a 67 y.o. right-handed female with history of hypertension, schizophrenia.  Presented 05/04/2017 with blurred vision and right-sided weakness with decrease in appetite.  Per chart review patient lives with spouse independent prior to admission. Husband can assist as needed. Cranial CT scan showed small bilateral occipital to early subacute infarcts without hemorrhage or mass-effect.  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  A CT of the chest positive for a large burden of what appeared to be both chronic thrombus and acute pulmonary embolism in the lungs bilaterally left greater than right as well as multiple small pulmonary nodules scattered throughout the lungs bilaterally measuring 2 - 5 mm in size concerning for metastatic lesions. Also noted large volume of ascites in the abdomen likely malignant given the apparent omental nodularity and underwent paracentesis 05/06/2017 with a total of 3.8 L of yellow fluid removed.  Vascular study lower extremity positive for DVT right lower extremity in the femoral vein, posterior tibial veins.  MRI demonstrated multiple additional subcentimeter foci of late subacute to chronic infarcts scattered throughout the supratentorial and infratentorial brain with few additional punctate foci and left frontal, right parietal and left thalamus.  Patient was placed on intravenous heparin for pulmonary emboli and await plan for long-term anticoagulation.  TCD bubble study negative for PFO. Patient has declined any further workup for metastatic carcinoma and consultation obtained with palliative care. Therapy evaluations have been completed and ongoing.  MD has requested physical medicine rehab consult. Patient was admitted for a comprehensive rehabilitation program    Review of  Systems  Constitutional: Positive for malaise/fatigue. Negative for fever.  HENT: Negative for hearing loss.   Eyes: Positive for blurred vision. Negative for photophobia.  Respiratory: Negative for cough and shortness of breath.   Cardiovascular: Positive for leg swelling. Negative for chest pain.  Gastrointestinal: Positive for constipation. Negative for nausea and vomiting.  Genitourinary: Negative for dysuria, flank pain and hematuria.  Musculoskeletal: Positive for myalgias.  Skin: Negative for rash.  Neurological: Positive for focal weakness. Negative for seizures.  Psychiatric/Behavioral:       Schizophrenia  All other systems reviewed and are negative.  Past Medical History:  Diagnosis Date  . CTS (carpal tunnel syndrome)    bilateral  . FATIGUE 08/27/2008  . GLUCOSE INTOLERANCE 06/25/2008  . HYPERLIPIDEMIA 06/25/2008  . HYPERTENSION 06/25/2008  . Impaired glucose tolerance 06/12/2010  . SCHIZOPHRENIA 06/25/2008   Past Surgical History:  Procedure Laterality Date  . DILATION AND CURETTAGE OF UTERUS  1999   hx of after miscarriage  . s/p cervical procedure  1995   LEEP   Family History  Problem Relation Age of Onset  . Hypertension Other   . Stroke Other    Social History:  reports that she has quit smoking. She quit smokeless tobacco use about 41 years ago. She reports that she does not drink alcohol. Her drug history is not on file. Allergies:  Allergies  Allergen Reactions  . Nabumetone Rash   Medications Prior to Admission  Medication Sig Dispense Refill  . benztropine (COGENTIN) 2 MG tablet Take 1 tablet (2 mg total) by mouth daily. 90 tablet 3  . colchicine 0.6 MG tablet 1 tab by mouth as needed for gout pain and swelling with repeat 1 per hour until improved or diarrhea  40 tablet 1  . escitalopram (LEXAPRO) 5 MG tablet Take 5 mg by mouth daily.    . haloperidol (HALDOL) 10 MG tablet Take 15 mg by mouth daily. Take 1 1/2 tab by mouth daily    .  lisinopril-hydrochlorothiazide (PRINZIDE,ZESTORETIC) 20-12.5 MG tablet Take 2 tablets by mouth daily. 180 tablet 3  . lovastatin (MEVACOR) 40 MG tablet Take 1 tablet (40 mg total) by mouth daily. 90 tablet 3  . aspirin 81 MG EC tablet Take 81 mg by mouth daily.        Drug Regimen Review Drug regimen was  Reviewed and remains appropriate with no significant issues identified  Home: Home Living Family/patient expects to be discharged to:: Private residence Living Arrangements: Spouse/significant other Available Help at Discharge: Family, Available 24 hours/day Type of Home: House Home Access: Level entry Home Layout: One level Bathroom Shower/Tub: Chiropodist: Standard Home Equipment: None  Lives With: Spouse   Functional History: Prior Function Level of Independence: Independent Comments: Drives. Not working. Wears glasses but needs a new prescription.  Functional Status:  Mobility: Bed Mobility Overal bed mobility: Needs Assistance Bed Mobility: Supine to Sit, Sit to Supine Supine to sit: Supervision, HOB elevated Sit to supine: Supervision, HOB elevated General bed mobility comments: No assist needed.  Transfers Overall transfer level: Needs assistance Equipment used: None Transfers: Sit to/from Stand Sit to Stand: Min guard Stand pivot transfers: Min assist General transfer comment: Min guard for safety. Stood from [employer]. Not using RUE to help push to stand. Ambulation/Gait Ambulation/Gait assistance: Min guard Ambulation Distance (Feet): 120 Feet Assistive device: None Gait Pattern/deviations: Step-through pattern, Decreased stride length, Leaning posteriorly, Narrow base of support General Gait Details: Very slow, guarded gait with stiff posture and no arm swing. Cues to increase cadence. Needed to stop multiple times during cognitive tasks. Min A at times due to posterior lean. HR ranged from 115-129 bpm.  Gait velocity: decreased Gait  velocity interpretation: Below normal speed for age/gender    ADL: ADL Overall ADL's : Needs assistance/impaired Eating/Feeding: Set up, Sitting Grooming: Oral care, Minimal assistance, Standing Grooming Details (indicate cue type and reason): Pt with two LOB due to posterior lean and required Min A to correct balance. Pt demonstrating poor cognition and required significant amount of time to complete task. Pt demosntrating poor working memory as seen by forgetting whether to push or pull faucet to turn on/off. Pt repeating process several times with poor recalling of whether to push or pull.  Upper Body Bathing: Minimal assistance, Sitting Lower Body Bathing: Moderate assistance, Sit to/from stand Upper Body Dressing : Minimal assistance, Sitting Lower Body Dressing: Moderate assistance, Sit to/from stand Toilet Transfer: Minimal assistance, Ambulation(Simulated to recliner. ) Toilet Transfer Details (indicate cue type and reason): Pt requiring Min A for balance. Noted posterior lean throughout session Functional mobility during ADLs: Minimal assistance, Cueing for safety General ADL Comments: Pt with decreased functional performance due to deficits in vision, cognition, and balance.  Cognition: Cognition Overall Cognitive Status: Impaired/Different from baseline Arousal/Alertness: Awake/alert Orientation Level: Oriented X4 Attention: Selective, Sustained Sustained Attention: Impaired Sustained Attention Impairment: Verbal basic, Functional basic Selective Attention: Impaired Selective Attention Impairment: Functional basic, Verbal basic Memory: Impaired Memory Impairment: Decreased recall of new information(delayed recall 0/5) Awareness: Impaired Awareness Impairment: Intellectual impairment Problem Solving: Impaired Problem Solving Impairment: Functional basic(names 2/3 ways to make $13) Executive Function: Sequencing, Decision Making, Initiating Sequencing: Impaired Sequencing  Impairment: Functional basic Decision Making: Impaired Decision Making Impairment: Functional  basic Initiating: Impaired Initiating Impairment: Verbal basic Safety/Judgment: Impaired Cognition Arousal/Alertness: Awake/alert Behavior During Therapy: Flat affect Overall Cognitive Status: Impaired/Different from baseline Area of Impairment: Attention, Problem solving, Following commands, Awareness Current Attention Level: Selective Memory: Decreased short-term memory Following Commands: Follows multi-step commands with increased time Awareness: Emergent Problem Solving: Slow processing, Requires verbal cues General Comments: Faster to respond to questions today and more conversive. Difficulty with dual tasking. Not able to count down by 7s from 100 during gait- balance worsens and RLE drags more. Difficulty naming veggies while walking as well.   Physical Exam: Blood pressure 126/79, pulse 99, temperature 98.3 F (36.8 C), temperature source Oral, resp. rate 18, height 5\' 8"  (1.727 m), weight 79.8 kg (175 lb 14.8 oz), SpO2 98 %. Physical Exam  Vitals reviewed. Constitutional: She appears well-nourished. No distress.  HENT:  Head: Normocephalic.  Eyes: EOM are normal. Right eye exhibits no discharge. Left eye exhibits no discharge.  Neck: Normal range of motion. Neck supple. No JVD present. No thyromegaly present.  Cardiovascular: Normal rate, regular rhythm, normal heart sounds and intact distal pulses. Exam reveals no gallop and no friction rub.  No murmur heard. Respiratory: Effort normal and breath sounds normal. No respiratory distress. She has no wheezes. She has no rales.  GI: Soft. Bowel sounds are normal. She exhibits no distension.  Musculoskeletal: She exhibits no edema.  Skin. Warm and dry Neurological: Slow to initiate conversation but provides her name and age. Follow simple commands. Processing delays. RUE 4/5 prox to distal. RLE similar at 4/5 prox to distal. Right  pronator drift. LUE 5/5. LLE 5/5. Senses pain and light touch in all 4 limbs.  Psych: Flat but cooperative   Results for orders placed or performed during the hospital encounter of 05/03/17 (from the past 48 hour(s))  Heparin level (unfractionated)     Status: None   Collection Time: 05/06/17  9:37 AM  Result Value Ref Range   Heparin Unfractionated 0.70 0.30 - 0.70 IU/mL    Comment:        IF HEPARIN RESULTS ARE BELOW EXPECTED VALUES, AND PATIENT DOSAGE HAS BEEN CONFIRMED, SUGGEST FOLLOW UP TESTING OF ANTITHROMBIN III LEVELS. Performed at [facility], [place] [address], [place], [place] [address]   APTT     Status: Abnormal   Collection Time: 05/06/17  9:37 AM  Result Value Ref Range   aPTT 71 (H) 24 - 36 seconds    Comment:        IF BASELINE aPTT IS ELEVATED, SUGGEST PATIENT RISK ASSESSMENT BE USED TO DETERMINE APPROPRIATE ANTICOAGULANT THERAPY. Performed at [facility], [place] [address] [place], [place] [address]   Culture, body fluid-bottle     Status: None (Preliminary result)   Collection Time: 05/06/17 11:48 AM  Result Value Ref Range   Specimen Description ASCITIC    Special Requests NONE    Culture      NO GROWTH 1 DAY Performed at [facility], [place] [address], [place], [place] [address]    Report Status PENDING   Gram stain     Status: None   Collection Time: 05/06/17 11:48 AM  Result Value Ref Range   Specimen Description ASCITIC    Special Requests NONE    Gram Stain      MODERATE WBC PRESENT, PREDOMINANTLY MONONUCLEAR NO ORGANISMS SEEN Performed at [facility], [place] [address] [place], [place] [address]    Report Status 05/06/2017 FINAL   Lactate  dehydrogenase (pleural or peritoneal fluid)     Status: Abnormal   Collection Time: 05/06/17 12:15 PM  Result Value Ref Range   LD, Fluid 638 (H) 3 - 23 U/L    Comment: (NOTE) Results should be evaluated in conjunction with serum values    Fluid Type-FLDH Peritoneal     Comment:  Performed at [facility], [place] [address], [place], [place] [address]  Body fluid cell count with differential     Status: Abnormal   Collection Time: 05/06/17 12:15 PM  Result Value Ref Range   Fluid Type-FCT Peritoneal    Color, Fluid YELLOW YELLOW   Appearance, Fluid CLEAR CLEAR   WBC, Fluid 599 0 - 1,000 cu mm   Neutrophil Count, Fluid 33 (H) 0 - 25 %   Lymphs, Fluid 53 %   Monocyte-Macrophage-Serous Fluid 14 (L) 50 - 90 %   Eos, Fluid 0 %   Other Cells, Fluid MESOTHELIAL CELLS PRESENT %    Comment: CLUMPS OF LARGE CELLS PRESENT Performed at [facility], [place] [address], [place], [place] [address]   Albumin, pleural or peritoneal fluid     Status: None   Collection Time: 05/06/17 12:15 PM  Result Value Ref Range   Albumin, Fluid 1.9 g/dL    Comment: (NOTE) No normal range established for this test Results should be evaluated in conjunction with serum values    Fluid Type-FALB Peritoneal     Comment: Performed at [facility], [place] [address], [place], [place] [address]  Amylase, pleural or peritoneal fluid     Status: None   Collection Time: 05/06/17 12:16 PM  Result Value Ref Range   Amylase, Fluid 58 U/L    Comment: NO NORMAL RANGE ESTABLISHED FOR THIS TEST Performed at [facility], [address], [place], [place] [address]    Fluid Type-FAMY Peritoneal     Comment: ABDOMEN Performed at [facility], [place] [address] [place], [place] [address]   APTT     Status: Abnormal   Collection Time: 05/06/17  7:07 PM  Result Value Ref Range   aPTT 71 (H) 24 - 36 seconds    Comment:        IF BASELINE aPTT IS ELEVATED, SUGGEST PATIENT RISK ASSESSMENT BE USED TO DETERMINE APPROPRIATE ANTICOAGULANT THERAPY. Performed at [facility], [place] [address] [place], [place] [address]   CBC     Status: Abnormal   Collection Time: 05/07/17  9:21 AM  Result Value Ref Range   WBC 12.9 (H) 4.0 - 10.5 K/uL   RBC 3.92 3.87 - 5.11 MIL/uL    Hemoglobin 10.3 (L) 12.0 - 15.0 g/dL   HCT 30.6 (L) 36.0 - 46.0 %   MCV 78.1 78.0 - 100.0 fL   MCH 26.3 26.0 - 34.0 pg   MCHC 33.7 30.0 - 36.0 g/dL   RDW 16.1 (H) 11.5 - 15.5 %   Platelets 333 150 - 400 K/uL    Comment: Performed at [facility], [place] [address], [place], [place] [address]  Heparin level (unfractionated)     Status: None   Collection Time: 05/07/17  9:21 AM  Result Value Ref Range   Heparin Unfractionated 0.48 0.30 - 0.70 IU/mL    Comment:        IF HEPARIN RESULTS ARE BELOW EXPECTED VALUES, AND PATIENT DOSAGE HAS BEEN CONFIRMED, SUGGEST FOLLOW UP TESTING OF ANTITHROMBIN III LEVELS. Performed at [facility],  [address], [place], [place] [address]   APTT     Status: Abnormal   Collection Time: 05/07/17  9:21 AM  Result Value Ref Range   aPTT 91 (H) 24 - 36 seconds    Comment:        IF BASELINE aPTT IS ELEVATED, SUGGEST PATIENT RISK ASSESSMENT BE USED TO DETERMINE APPROPRIATE ANTICOAGULANT THERAPY. Performed at [facility], [place] [address] [place], [place] [address]    US Paracentesis  Result Date: 05/06/2017 INDICATION: Ascites EXAM: ULTRASOUND-GUIDED PARACENTESIS COMPARISON:  None. MEDICATIONS: 10 cc 1% lidocaine. COMPLICATIONS: None immediate. TECHNIQUE: Informed written consent was obtained from the patient after a discussion of the risks, benefits and alternatives to treatment. A timeout was performed prior to the initiation of the procedure. Initial ultrasound scanning demonstrates a large amount of ascites within the left lower abdominal quadrant. The left lower abdomen was prepped and draped in the usual sterile fashion. 1% lidocaine with epinephrine was used for local anesthesia. Under direct ultrasound guidance, a 19 gauge, 7-cm, Yueh catheter was introduced. An ultrasound image was saved for documentation purposed. The paracentesis was performed. The catheter was removed and a dressing was applied. The patient tolerated the procedure  well without immediate post procedural complication. FINDINGS: A total of approximately 3.8 liters of yellow fluid was removed. Samples were sent to the laboratory as requested by the clinical team. IMPRESSION: Successful ultrasound-guided paracentesis yielding 3.8 liters of peritoneal fluid. Read by Lavonia Drafts [facility] Electronically Signed   By: Jerilynn Mages.  Shick M.D.   On: 05/06/2017 12:03       Medical Problem List and Plan: 1.  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  DVT Prophylaxis/Anticoagulation/bilateral pulmonary emboli left greater than right and right lower extremity femoral vein, posterior tibial vein DVT.: Presently on heparin 3. Pain Management:  Tramadol as needed 4. Mood/ schizophrenia: Lexapro 5 mg daily, Haldol 15 mg daily, cogent and 2 mg daily 5. Neuropsych: This patient is capable of making decisions on her own behalf. 6. Skin/Wound Care: Routine skin checks 7. Fluids/Electrolytes/Nutrition: Routine I&O's with follow-up chemistries 8.Metastatic carcinoma. Patient declines any further workup at this time. Palliative care consulted 9.Ascites. Status post paracentesis 05/06/2017 10. Hyperlipidemia. Pravachol    Post Admission Physician Evaluation: 1. 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 . 2. Patient is admitted to receive collaborative, interdisciplinary care between the physiatrist, rehab nursing staff, and therapy team. 3. Patient's level of medical complexity and substantial therapy needs in context of that medical necessity cannot be provided at a lesser intensity of care such as a SNF. 4. Patient has experienced substantial functional loss from his/her baseline which was documented above under the "Functional History" and "Functional Status" headings.  Judging by the patient's diagnosis, physical exam, and functional history, the  patient has potential for functional progress which will result in measurable gains while on inpatient rehab.  These gains will be of substantial and practical use upon discharge  in facilitating mobility and self-care at the household level. 5. Physiatrist will provide 24 hour management of medical needs as well as oversight of the therapy plan/treatment and provide guidance as appropriate regarding the interaction of the two. 6. The Preadmission Screening has been reviewed and patient status is unchanged unless otherwise stated above. 7. 24 hour rehab nursing will assist with bladder management, bowel management, safety, skin/wound care, disease management, medication administration, pain  management and patient education  and help integrate therapy concepts, techniques,education, etc. 8. PT will assess and treat for/with: Lower extremity strength, range of motion, stamina, balance, functional mobility, safety, adaptive techniques and equipment, NMR, cognitive perceptual awareness.   Goals are: mod I to supervision. 9. OT will assess and treat for/with: ADL's, functional mobility, safety, upper extremity strength, adaptive techniques and equipment, NMR, cognitive perceptual awareness, family ed.   Goals are: supervision to mod I. Therapy may proceed with showering this patient. 10. SLP will assess and treat for/with: cognition, communication, family education.  Goals are: mod I to supervision. 11. Case Management and Social Worker will assess and treat for psychological issues and discharge planning. 12. Team conference will be held weekly to assess progress toward goals and to determine barriers to discharge. 13. Patient will receive at least 3 hours of therapy per day at least 5 days per week. 14. ELOS: 8-11 days       15. Prognosis:  excellent     Meredith Staggers, MD, [facility] 05/08/2017  Lavon Paganini [place], PA-C 05/08/2017

## 2017-05-08 NOTE — Consult Note (Addendum)
Consultation Note Date: 05/08/2017   Patient Name: [name] [name]  DOB: 1950-10-01  MRN: [id number]  Age / Sex: 67 y.o., female  PCP: Biagio Borg, MD Referring Physician: Velvet Bathe, MD  Reason for Consultation: Establishing goals of care  HPI/Patient Profile: 67 y.o. female  with past medical history of coronary artery disease and schizophrenia who was admitted on 05/03/2017 with blurred vision and right sided weakness.  She was found to have multiple region of small acute and chronic infarcts.  Imaging also revealed DVT and PE.  On 3/17 paracentesis removed 3.8 liters of fluid.  It is felt that she likely has hypercoagulable state secondary to advanced metastatic cancer.   Clinical Assessment and Goals of Care:  I have reviewed medical records including EPIC notes, labs and imaging, received report from Dr. Wendee Beavers, assessed the patient and then met at the bedside along with her husband to discuss [place], disposition and options.  I introduced Palliative Medicine as specialized medical care for people living with serious illness. It focuses on providing relief from the symptoms and stress of a serious illness. The goal is to improve quality of life for both the patient and the family.  We discussed a brief life review of the patient. She and Mr. Whitner have been married 21 years.  She has seen him thru surgery/chemo/radiation for cancer.  He is now cured.  He plans to take her home and care for her after rehab.  As far as functional and nutritional status she is weak but able to walk without assistance.  Her appetite is decreased.  We discussed her current hypercoagulable state and the probability that she has metastatic cancer.  We talked about whether or not she would want to pursue a work up for possible treatment.  The patient's initial response was that she did not want treatment - after seeing what her  husband went thru she does not want to go thru treatment.  In particular she would want to avoid the burns associated with radiation.  We discussed palliative treatment vs curative treatment.  I deferred her specific questions about treatment to the Oncologist.    Mr. Mcinnis disagreed with his wife - telling her that he did not want to be alone and that she needed to take treatment in order to live as long as possible.  We completed a MOST form.  Mrs. Beeck continually deferred to Mr. Krienke to make decisions for her.  On the MOST form they chose: DNR - if she arrests then do not resuscitate. Full scope treatment - if she has a pulse or is breathing pursue full scope treatment including a limited period of intubation. Feeding tube - no.  Mrs. Lagman never wants a feeding tube.  Hospice and Palliative Care services outpatient were discussed at a high level.  Questions and concerns were addressed.  Hard Choices booklet left for review. The family was encouraged to call with questions or concerns.   Primary Decision Maker:  PATIENT and Husband.  SUMMARY OF RECOMMENDATIONS     Code status changed to DNR - full scope treatment  No feeding tube ever.  Consult oncology.  Patient is likely willing to do some limited chemo if it will buy her some time and not cause too many side effects.  Out patient Palliative follow up is recommended after CIR  Code Status/Advance Care Planning:  DNR  Psycho-social/Spiritual:   Desire for further Chaplaincy support:   Prognosis:  Unable to determine at this point.  Patient is at high risk of an acute event that could end her life given hypercoagulable state, bilateral PE, DVT, multiple acute and chronic infarcts.  Discharge Planning: To Be Determined      Primary Diagnoses: Present on Admission: . Stroke ([place]) . Essential hypertension . Schizophrenia ([place]) . AKI (acute kidney injury) ([place]) . Normocytic anemia . Acute lower UTI   I have  reviewed the medical record, interviewed the patient and family, and examined the patient. The following aspects are pertinent.  Past Medical History:  Diagnosis Date  . CTS (carpal tunnel syndrome)    bilateral  . FATIGUE 08/27/2008  . GLUCOSE INTOLERANCE 06/25/2008  . HYPERLIPIDEMIA 06/25/2008  . HYPERTENSION 06/25/2008  . Impaired glucose tolerance 06/12/2010  . SCHIZOPHRENIA 06/25/2008   Social History   Socioeconomic History  . Marital status: Married    Spouse name: None  . Number of children: 2  . Years of education: None  . Highest education level: None  Social Needs  . Financial resource strain: None  . Food insecurity - worry: None  . Food insecurity - inability: None  . Transportation needs - medical: None  . Transportation needs - non-medical: None  Occupational History  . Occupation: disabled psych  Tobacco Use  . Smoking status: Former Research scientist (life sciences)  . Smokeless tobacco: Former Systems developer    Quit date: 02/21/1976  . Tobacco comment: quit a long time ago;   Substance and Sexual Activity  . Alcohol use: No    Alcohol/week: 0.0 oz  . Drug use: None  . Sexual activity: None  Other Topics Concern  . None  Social History Narrative  . None   Family History  Problem Relation Age of Onset  . Hypertension Other   . Stroke Other    Scheduled Meds: . aspirin EC  81 mg Oral Daily  . benztropine  2 mg Oral Daily  . enoxaparin (LOVENOX) injection  120 mg Subcutaneous Q24H  . escitalopram  5 mg Oral Daily  . haloperidol  15 mg Oral Daily  . pravastatin  40 mg Oral q1800  . vitamin B-12  1,000 mcg Oral Daily   Continuous Infusions: . cefTRIAXone (ROCEPHIN)  IV 1 g (05/08/17 0530)   PRN Meds:.acetaminophen **OR** acetaminophen (TYLENOL) oral liquid 160 mg/5 mL **OR** acetaminophen, hydrALAZINE, senna-docusate, traMADol Allergies  Allergen Reactions  . Nabumetone Rash   Review of Systems patient denies pain, SOB, anorexia.  She endorses weakness, fatigue  Physical Exam  Well  developed female, thought process is clear though slightly slowed. A&O, NAD, cooperative. Able to ambulate independently.  Vital Signs: BP 118/78 (BP Location: Right Arm)   Pulse (!) 101   Temp 98.6 F (37 C) (Oral)   Resp 16   Ht 5' 8"  (1.727 m)   Wt 79.8 kg (175 lb 14.8 oz)   SpO2 97%   BMI 26.75 kg/m  Pain Assessment: No/denies pain   Pain Score: 0-No pain   SpO2: SpO2: 97 % O2 Device:SpO2: 97 % O2 Flow  Rate: .O2 Flow Rate (L/min): 2 L/min  IO: Intake/output summary:   Intake/Output Summary (Last 24 hours) at 05/08/2017 1626 Last data filed at 05/08/2017 0446 Gross per 24 hour  Intake -  Output 1 ml  Net -1 ml    LBM: Last BM Date: 05/04/17 Baseline Weight: Weight: 79.8 kg (175 lb 14.8 oz) Most recent weight: Weight: 79.8 kg (175 lb 14.8 oz)     Palliative Assessment/Data: 50%     Time In: 3:00 Time Out: 4:10 Time Total: 70 min. Greater than 50%  of this time was spent counseling and coordinating care related to the above assessment and plan.  Signed by: Florentina Jenny, PA-C Palliative Medicine Pager: [phone]  Please contact Palliative Medicine Team phone at [phone] for questions and concerns.  For individual provider: See Shea Evans

## 2017-05-08 NOTE — Progress Notes (Signed)
PROGRESS NOTE    [name] [name]  ELF:[id number] DOB: 1951-01-14 DOA: 05/03/2017 PCP: Biagio Borg, MD    Brief Narrative:  67 y.o. female with medical history significant for schizophrenia, coronary artery disease, and hypertension, now presenting to the emergency department for evaluation of weakness, malaise, blurred vision, and loss of appetite. Patient reports that she been in her usual state of health until approximately 3-4 days ago when she developed a nonspecific malaise and loss of appetite.  Pt diagnosed with metastatic carcinoma unknown type, PE/DVT, acute stroke and elevated troponin most likey due to PE. Cardiology consulted for elevated troponin but patient was found to have PE as such has signed off  Discussed case with pulmonologist. Obtained paracentesis with cytology to assist with diagnosis of metastatic carcinoma. Pt will need PET scan in the future. But given all this after my discussion with patient she did not wish for any further workup or even chemotherapy. As such I have placed consult to palliative care for goals of care.  Assessment & Plan:   Principal Problem:   Stroke [facility]) - Neurology managing - Most likely due to hypercoagulability secondary to metastatic carcinoma  Metastatic carcinoma - U/S paracentesis order placed and patient is s/p paracentesis. Hopes are to obtain diagnosis with cytology evaluation. Discussed with patient. - Pt complaining of pain. Will add tramadol - consult CIR for inpatient rehab - Pt does not want work up, Palliative consulted and assisting with Goals of Care.  Patient would like another day in house to discuss this further with palliative and make plans - f/u with paracentesis cytology results   Acute deep vein thrombosis (DVT) of popliteal vein of right lower extremity (HCC)/Acute saddle pulmonary embolism with acute cor pulmonale ([place]) - Secondary to metastatic carcinoma. Continue anticoagulation with heparin. Moving forward  will be challenging what to place patient on since she has metastatic carcinoma. Will consult pharmacy to transition to Lovenox today.  Active Problems:   Schizophrenia ([place]) - Stable currently continue current medical regimen    Essential hypertension - Given acute stroke will discontinue metoprolol - placed hydralazine prn orders    AKI (acute kidney injury) ([place]) - Stable continue to monitor serum creatinine    Normocytic anemia   Acute lower UTI - Pt on Rocephin - urine culture non conclusive    DVT prophylaxis: Heparin>>lovenox injections Code Status: Full Family Communication: Discussed with patient and family member at bedside with patient permission Disposition Plan: Pending discussion with palliative care team.   Consultants:   Neurology  Cardiology  Pulmonology  Interventional radiology  Procedures: Stroke workup  Antimicrobials: Rocephin   Subjective: No new complaints reported to me today.  Objective: Vitals:   05/08/17 0025 05/08/17 0438 05/08/17 0800 05/08/17 1255  BP: 132/81 126/79 122/74 118/78  Pulse: (!) 103 99 88 (!) 101  Resp: 18 18 18 16   Temp: 98.6 F (37 C) 98.3 F (36.8 C) 98.3 F (36.8 C) 98.6 F (37 C)  TempSrc: Oral Oral Oral Oral  SpO2: 96% 98% 97% 97%  Weight:      Height:        Intake/Output Summary (Last 24 hours) at 05/08/2017 1612 Last data filed at 05/08/2017 0446 Gross per 24 hour  Intake -  Output 1 ml  Net -1 ml   Filed Weights   05/04/17 0300  Weight: 79.8 kg (175 lb 14.8 oz)    Examination: Exam unchanged when compared to prior on 05/07/17  General exam: Appears calm and comfortable,  in nad.  Respiratory system: Clear to auscultation. Respiratory effort normal. Equal chest rise.  Cardiovascular system: S1 & S2 heard, RRR. No JVD, murmurs, rubs, gallops or clicks. No pedal edema. Gastrointestinal system: Abdomen is nondistended, soft and nontender. No organomegaly or masses felt. Normal bowel sounds  heard. Central nervous system: Alert and awake. Answers questions appropriately has word finding difficulty. Extremities: Symmetric 5 x 5 power. Skin: No rashes, lesions or ulcers, on limited exam. Psychiatry:  Mood & affect appropriate.     Data Reviewed: I have personally reviewed following labs and imaging studies  CBC: Recent Labs  Lab 05/03/17 2352  05/04/17 0457 05/05/17 0535 05/06/17 0431 05/07/17 0921 05/08/17 0547  WBC 12.2*  --  10.9* 10.9* 12.5* 12.9* 12.8*  NEUTROABS 10.0*  --   --   --   --   --   --   HGB 10.3*   < > 10.3* 10.6* 10.7* 10.3* 10.4*  HCT 30.8*   < > 31.0* 32.2* 31.8* 30.6* 30.9*  MCV 80.0  --  79.5 79.7 79.3 78.1 78.2  PLT 121*  --  121* 159 243 333 413*   < > = values in this interval not displayed.   Basic Metabolic Panel: Recent Labs  Lab 05/03/17 2352 05/04/17 0000 05/04/17 0457  NA 133* 132* 131*  K 3.7 3.7 3.8  CL 100* 98* 101  CO2 22  --  20*  GLUCOSE 132* 128* 103*  BUN 15 15 14   CREATININE 1.63* 1.50* 1.48*  CALCIUM 8.4*  --  8.3*   GFR: Estimated Creatinine Clearance: 42.6 mL/min (A) (by C-G formula based on SCr of 1.48 mg/dL (H)). Liver Function Tests: Recent Labs  Lab 05/03/17 2352  AST 18  ALT 8*  ALKPHOS 54  BILITOT 0.6  PROT 6.2*  ALBUMIN 2.4*   No results for input(s): LIPASE, AMYLASE in the last 168 hours. No results for input(s): AMMONIA in the last 168 hours. Coagulation Profile: Recent Labs  Lab 05/03/17 2352  INR 1.32   Cardiac Enzymes: Recent Labs  Lab 05/04/17 0457 05/04/17 1010 05/04/17 1653  TROPONINI 0.12* 0.13* 0.15*   BNP (last 3 results) No results for input(s): PROBNP in the last 8760 hours. HbA1C: No results for input(s): HGBA1C in the last 72 hours. CBG: No results for input(s): GLUCAP in the last 168 hours. Lipid Profile: No results for input(s): CHOL, HDL, LDLCALC, TRIG, CHOLHDL, LDLDIRECT in the last 72 hours. Thyroid Function Tests: No results for input(s): TSH, T4TOTAL,  FREET4, T3FREE, THYROIDAB in the last 72 hours. Anemia Panel: No results for input(s): VITAMINB12, FOLATE, FERRITIN, TIBC, IRON, RETICCTPCT in the last 72 hours. Sepsis Labs: No results for input(s): PROCALCITON, LATICACIDVEN in the last 168 hours.  Recent Results (from the past 240 hour(s))  Urine Culture     Status: Abnormal   Collection Time: 05/04/17  7:25 PM  Result Value Ref Range Status   Specimen Description URINE, RANDOM  Final   Special Requests NONE  Final   Culture (A)  Final    <10,000 COLONIES/mL INSIGNIFICANT GROWTH Performed at [facility], [address] [place], [place] [address]    Report Status 05/05/2017 FINAL  Final  Culture, body fluid-bottle     Status: None (Preliminary result)   Collection Time: 05/06/17 11:48 AM  Result Value Ref Range Status   Specimen Description ASCITIC  Final   Special Requests NONE  Final   Culture   Final    NO GROWTH 2  DAYS Performed at [facility], [place] [address] [place], [place] [address]    Report Status PENDING  Incomplete  Gram stain     Status: None   Collection Time: 05/06/17 11:48 AM  Result Value Ref Range Status   Specimen Description ASCITIC  Final   Special Requests NONE  Final   Gram Stain   Final    MODERATE WBC PRESENT, PREDOMINANTLY MONONUCLEAR NO ORGANISMS SEEN Performed at [facility], [address], [place], [place] [address]    Report Status 05/06/2017 FINAL  Final         Radiology Studies: No results found.  Scheduled Meds: . aspirin EC  81 mg Oral Daily  . benztropine  2 mg Oral Daily  . enoxaparin (LOVENOX) injection  120 mg Subcutaneous Q24H  . escitalopram  5 mg Oral Daily  . haloperidol  15 mg Oral Daily  . pravastatin  40 mg Oral q1800  . vitamin B-12  1,000 mcg Oral Daily   Continuous Infusions: . cefTRIAXone (ROCEPHIN)  IV 1 g (05/08/17 0530)     LOS: 4 days    Time spent: > 40 minutes  Velvet Bathe, MD [facility] Pager [phone]  If  7PM-7AM, please contact night-coverage [email or web address] Password [email or web address] 05/08/2017, 4:12 PM

## 2017-05-08 NOTE — Progress Notes (Addendum)
ANTICOAGULATION CONSULT NOTE - Follow Up Consult  Pharmacy Consult for Heparin to Lovenox Indication: Acute DVT/PE w/ RHS  Allergies  Allergen Reactions  . Nabumetone Rash    Patient Measurements: Height: 5\' 8"  (172.7 cm) Weight: 175 lb 14.8 oz (79.8 kg) IBW/kg (Calculated) : 63.9 Heparin Dosing Weight: 80 kg  Vital Signs: Temp: 98.6 F (37 C) (03/19 1255) Temp Source: Oral (03/19 1255) BP: 118/78 (03/19 1255) Pulse Rate: 101 (03/19 1255)  Labs: Recent Labs    05/06/17 0431 05/06/17 0937 05/06/17 1907 05/07/17 0921 05/08/17 0547  HGB 10.7*  --   --  10.3* 10.4*  HCT 31.8*  --   --  30.6* 30.9*  PLT 243  --   --  333 413*  APTT  --  71* 71* 91*  --   HEPARINUNFRC  --  0.70  --  0.48 0.35    Estimated Creatinine Clearance: 42.6 mL/min (A) (by C-G formula based on SCr of 1.48 mg/dL (H)).   Assessment: 49 YOF with new DVT and new CVA - pattern consistent with systemic emboli, concerning for hypercoagulable state d/t advanced malignancy. Heparin was started 3/15 after patient received one dose of Eliquis 10 mg, using aPTT to monitor heparin.   Now transitioning from heparin to Lovenox  Goal of Therapy:  Monitor platelets by anticoagulation protocol: Yes  Plan:  DC heparin and heparin labs Start Lovenox 120 mg sq Q 24 hour 1 hour after heparin stops Monitor clinical course, s/sx bleeding  Thank you Anette Guarneri, PharmD [phone]  05/08/2017,3:43 PM

## 2017-05-08 NOTE — Progress Notes (Signed)
Physical Therapy Treatment Patient Details Name: [name] [name] MRN: [id number] DOB: 12-04-1950 Today's Date: 05/08/2017    History of Present Illness Patient is a 67 y/o female who presents with right sided weakness, dizziness, blurry vision and AMS. Head CT-subacute bil occipital infarcts. Doppler- + DVT RLE and CTA chest- + PE bilaterally. CTA- Rt ICA occlusion with thrombus. Brain MRI- Small regions of acute/early subacute infarction within right posterior parietal and left occipital lobes. Few additional punctate foci in left frontal, right parietal, and left thalamus. s/p paracentesis 3/17.  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 PMH includes schizophrenia, CAD and HTN.     PT Comments    Patient progressing well towards PT goals. Continues to have difficulties with cognitive tasks and dual tasking esp with ambulation- noted to have decreased gait speed, shuffling of bil feet and worsened balance. Tolerated higher level balance challenges but needed external support due to LOB with backward walking and head turns. Great CIR candidate. Will follow.   Follow Up Recommendations  CIR;Supervision for mobility/OOB     Equipment Recommendations  None recommended by PT    Recommendations for Other Services       Precautions / Restrictions Precautions Precautions: Fall Precaution Comments: watch HR Restrictions Weight Bearing Restrictions: No    Mobility  Bed Mobility Overal bed mobility: Needs Assistance Bed Mobility: Supine to Sit;Sit to Supine     Supine to sit: Modified independent (Device/Increase time);HOB elevated Sit to supine: Modified independent (Device/Increase time);HOB elevated   General bed mobility comments: No assist needed.   Transfers Overall transfer level: Needs assistance Equipment used: None Transfers: Sit to/from Stand Sit to Stand: Min guard         General transfer comment: Min guard for safety. Very guarded with all movements. Stood  from [employer], declined transferring to chair.   Ambulation/Gait Ambulation/Gait assistance: Min assist Ambulation Distance (Feet): 120 Feet Assistive device: None Gait Pattern/deviations: Step-through pattern;Decreased stride length;Leaning posteriorly;Narrow base of support;Shuffle Gait velocity: decreased Gait velocity interpretation: Below normal speed for age/gender General Gait Details: Very slow, guarded gait with stiff posture, cues for arm swing bilaterally. Cues to increase cadence. Needed to stop multiple times during cognitive tasks. Min A at times due to posterior lean esp when dual tasking.    Stairs Stairs: Yes   Stair Management: Alternating pattern;Two rails Number of Stairs: 3(+ 2 steps x2 bouts) General stair comments: Cues for technique.   Wheelchair Mobility    Modified Rankin (Stroke Patients Only) Modified Rankin (Stroke Patients Only) Pre-Morbid Rankin Score: No symptoms Modified Rankin: Moderately severe disability     Balance Overall balance assessment: Needs assistance Sitting-balance support: Feet supported;No upper extremity supported Sitting balance-Leahy Scale: Good     Standing balance support: During functional activity Standing balance-Leahy Scale: Good               High level balance activites: Backward walking;Direction changes;Turns;Sudden stops;Head turns High Level Balance Comments: Able to perform above with deviations in gait requiring external support to prevent fall; LOB walking backwards. Veering to right/left with head turns.             Cognition Arousal/Alertness: Awake/alert Behavior During Therapy: Flat affect Overall Cognitive Status: Impaired/Different from baseline Area of Impairment: Attention;Problem solving                   Current Attention Level: Selective         Problem Solving: Slow processing;Requires verbal cues General Comments: Difficulty with  dual tasking while walking- hard time  counting down by 5s from 100 during gait resulting in decreased speed and imbalance.       Exercises      General Comments General comments (skin integrity, edema, etc.): Spouse present during session.       Pertinent Vitals/Pain Pain Assessment: No/denies pain    Home Living                      Prior Function            PT Goals (current goals can now be found in the care plan section) Progress towards PT goals: Progressing toward goals    Frequency    Min 4X/week      PT Plan Current plan remains appropriate    Co-evaluation              AM-PAC PT "6 Clicks" Daily Activity  Outcome Measure  Difficulty turning over in bed (including adjusting bedclothes, sheets and blankets)?: None Difficulty moving from lying on back to sitting on the side of the bed? : None Difficulty sitting down on and standing up from a chair with arms (e.g., wheelchair, bedside commode, etc,.)?: None Help needed moving to and from a bed to chair (including a wheelchair)?: None Help needed walking in hospital room?: A Little Help needed climbing 3-5 steps with a railing? : A Little 6 Click Score: 22    End of Session Equipment Utilized During Treatment: Gait belt Activity Tolerance: Patient tolerated treatment well Patient left: in bed;with call bell/phone within reach;with family/visitor present Nurse Communication: Mobility status PT Visit Diagnosis: Muscle weakness (generalized) (M62.81);Hemiplegia and hemiparesis;Unsteadiness on feet (R26.81);Difficulty in walking, not elsewhere classified (R26.2) Hemiplegia - Right/Left: Right Hemiplegia - dominant/non-dominant: Dominant Hemiplegia - caused by: Cerebral infarction     Time: 1771-1657 PT Time Calculation (min) (ACUTE ONLY): 16 min  Charges:  $Neuromuscular Re-education: 8-22 mins                    G Codes:       Wray Kearns, PT, DPT [phone]     [name] 05/08/2017, 11:56 AM

## 2017-05-08 NOTE — Progress Notes (Addendum)
New Admit: Pt A/Ox4, 3/10 chest pain she noted tramodol helps with the pain. Shawn, RN noted in report DNR and  chest pain resulted in PE. Skin intact, bruising LLE. Delayed response. Cont B/B; LBM 3/15. Pt is being treated for depression. Spouse will stay over night. Spouse was encourage to participate in therapy as much as he can, so once pt is discharge he or pt will not get overwhelm. Pt and spouse is excited about rehab. Pt would like to rest tonight. She has schedule. Staff will continue to educate and monitor, and maintain safety.

## 2017-05-08 NOTE — Progress Notes (Signed)
Maudie Mercury, Pharmacist [phone] call reference to the heparin consult.

## 2017-05-08 NOTE — Care Management Note (Signed)
Case Management Note  Patient Details  Name: [name] [name] MRN: [id number] Date of Birth: Jun 29, 1950  Subjective/Objective:                    Action/Plan: Pt discharging to CIR today. CM signing off.   Expected Discharge Date:                  Expected Discharge Plan:  [place]  In-House Referral:     Discharge planning Services  CM Consult  Post Acute Care Choice:    Choice offered to:     DME Arranged:    DME Agency:     HH Arranged:    [place] Agency:     Status of Service:  Completed, signed off  If discussed at [employer] of Stay Meetings, dates discussed:    Additional Comments:  Pollie Friar, RN 05/08/2017, 3:49 PM

## 2017-05-08 NOTE — Progress Notes (Signed)
Inpatient Rehabilitation  Met with patient, spouse, and daughter at bedside to discuss team's recommendation for IP Rehab.  Shared booklets, insurance verification letter, and answered questions.  I have received medical clearance and plan to proceed with admission today.  Discussed with team; call if questions.    Carmelia Roller., CCC/SLP Admission Coordinator  [place]  Cell [phone]

## 2017-05-08 NOTE — Progress Notes (Signed)
ANTICOAGULATION CONSULT NOTE - Follow Up Consult  Pharmacy Consult for Heparin to Lovenox Indication: Acute DVT/PE w/ RHS  Allergies  Allergen Reactions  . Nabumetone Rash    Patient Measurements: Height: 5\' 10"  (177.8 cm) Weight: 185 lb 13.6 oz (84.3 kg) IBW/kg (Calculated) : 68.5 Heparin Dosing Weight: 80 kg  Vital Signs: Temp: 98.6 F (37 C) (03/19 1756) Temp Source: Oral (03/19 1756) BP: 119/71 (03/19 1756) Pulse Rate: 106 (03/19 1756)  Labs: Recent Labs    05/06/17 0431 05/06/17 0937 05/06/17 1907 05/07/17 0921 05/08/17 0547  HGB 10.7*  --   --  10.3* 10.4*  HCT 31.8*  --   --  30.6* 30.9*  PLT 243  --   --  333 413*  APTT  --  71* 71* 91*  --   HEPARINUNFRC  --  0.70  --  0.48 0.35    Estimated Creatinine Clearance: 45.3 mL/min (A) (by C-G formula based on SCr of 1.48 mg/dL (H)).   Assessment: 42 YOF with new DVT and new CVA - pattern consistent with systemic emboli, concerning for hypercoagulable state d/t advanced malignancy. Heparin was started 3/15 after patient received one dose of Eliquis 10 mg.  Was to be transitioned to enoxaparin prior to transfer to 59M; however, hadn't received dose yet. Pharmacy consulted for heparin infusion - and contacted MD to change back to enoxaparin. Orders entered to be given STAT and resume regimen of enoxaparin 120 mg subQ every 24 hours. Will continue to monitor renal function, CBC, and for signs/symptoms of bleeding.   Thank you Doylene Canard, PharmD Clinical Pharmacist  Pager: [phone] Phone: [phone] 05/08/2017,7:48 PM

## 2017-05-08 NOTE — Progress Notes (Signed)
Occupational Therapy Treatment Patient Details Name: [name] [name] MRN: [id number] DOB: 1951/01/05 Today's Date: 05/08/2017    History of present illness Patient is a 67 y/o female who presents with right sided weakness, dizziness, blurry vision and AMS. Head CT-subacute bil occipital infarcts. Doppler- + DVT RLE and CTA chest- + PE bilaterally. CTA- Rt ICA occlusion with thrombus. Brain MRI- Small regions of acute/early subacute infarction within right posterior parietal and left occipital lobes. Few additional punctate foci in left frontal, right parietal, and left thalamus. s/p paracentesis 3/17.  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 PMH includes schizophrenia, CAD and HTN.    OT comments  Performed toileting and grooming tasks. Min guard for safety.  Pt had difficulty controlling direction when rinsing mouth:  Did not modify rinsing when she partially missed sink nor clean up.  Edema noted in L hand; educated on controlling this.  Pt did attend to L side during session and used L hand during tasks  Follow Up Recommendations  CIR    Equipment Recommendations  (tub seat vs bench)    Recommendations for Other Services      Precautions / Restrictions Precautions Precautions: Fall Precaution Comments: watch HR       Mobility Bed Mobility         Supine to sit: Supervision Sit to supine: Supervision      Transfers       Sit to Stand: Min guard         General transfer comment: for safety    Balance                                           ADL either performed or assessed with clinical judgement   ADL       Grooming: Oral care;Min guard;Standing                   Toilet Transfer: Minimal assistance;Ambulation;Regular Museum/gallery exhibitions officer and Hygiene: Min guard;Sit to/from stand         General ADL Comments: ambulated to bathroom; stood at sink twice:  brushed teeth then washed hands after  toileting.  Pt pushed IV pole; steadying assistance given.  Pt had difficulty controlling where water went and was unaware of this; did not clean sink. Pt did attend to L side without cues. She used bil hands to open containers but tends to position L awkwardly when not actively engaging it.       Vision       Perception     Praxis      Cognition Arousal/Alertness: Awake/alert Behavior During Therapy: Flat affect   Area of Impairment: Attention                   Current Attention Level: Selective                    Exercises     Shoulder Instructions       General Comments L hand with edema; educated on AROM and positioning. Positioned up on pillow at end of session    Pertinent Vitals/ Pain       Pain Assessment: No/denies pain  Home Living  Prior Functioning/Environment              Frequency  Min 3X/week        Progress Toward Goals  OT Goals(current goals can now be found in the care plan section)  Progress towards OT goals: Progressing toward goals     Plan      Co-evaluation                 AM-PAC PT "6 Clicks" Daily Activity     Outcome Measure                    End of Session    OT Visit Diagnosis: Unsteadiness on feet (R26.81);Other abnormalities of gait and mobility (R26.89);Muscle weakness (generalized) (M62.81);Other symptoms and signs involving cognitive function;Low vision, both eyes (H54.2)   Activity Tolerance Patient tolerated treatment well   Patient Left in bed;with call bell/phone within reach;with bed alarm set;with family/visitor present   Nurse Communication          Time: 4742-5956 OT Time Calculation (min): 17 min  Charges: OT General Charges $OT Visit: 1 Visit OT Treatments $Self Care/Home Management : 8-22 mins  [name] [name], OTR/L [phone] 05/08/2017   [name] [name] 05/08/2017, 8:58 AM

## 2017-05-08 NOTE — Plan of Care (Signed)
New admit

## 2017-05-08 NOTE — PMR Pre-admission (Signed)
PMR Admission Coordinator Pre-Admission Assessment  Patient: [name] [name] is an 67 y.o., female MRN: [id number] DOB: May 29, 1950 Height: 5\' 8"  (172.7 cm) Weight: 79.8 kg (175 lb 14.8 oz)              Insurance Information HMO:     PPO:      PCP:      IPA:      80/20:      OTHER:  PRIMARY: Medicare A & B      Policy#: [id number]      Subscriber: Self CM Name:       Phone#:      Fax#:  Pre-Cert#: Eligible       Employer:  Benefits:  Phone #: Verified online      Name: [employer]  Eff. 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 A CT of the chest positive for a large burden of what appeared to be both chronic thrombus and acute pulmonary embolism in the lungs bilaterally left greater than right as well as multiple small  pulmonary nodules scattered throughout the lungs bilaterally measuring 2 -5 mm in size concerning for metastatic lesions. Also noted large volume of ascites in the abdomen likely malignant given the apparent omental nodularity and underwent paracentesis 05/06/2017 with a total of 3.8 L of yellow fluid removed. Vascular study lower extremity positive for DVT right lower extremity in the femoral vein, posterior tibial veins. MRI demonstrated multiple additional subcentimeter foci of late subacute to chronic infarcts scattered throughout the supratentorial and infratentorial brain with few additional punctate foci and left frontal, right parietal and left thalamus. Patient was placed on intravenous heparin for pulmonary emboli and await plan for long-term anticoagulation. TCD bubble study negative for PFO. Patient has declined any further workup for metastatic carcinoma and consultation obtained with palliative care.Therapy evaluations have been completed and ongoing. MD has requested physical medicine rehab consult. Patient was admitted for a comprehensive rehabilitation program 05/08/17.  NIH Total: 2    Past Medical History  Past Medical History:  Diagnosis Date  . CTS (carpal tunnel syndrome)    bilateral  . FATIGUE 08/27/2008  . GLUCOSE INTOLERANCE 06/25/2008  . HYPERLIPIDEMIA 06/25/2008  . HYPERTENSION 06/25/2008  .  Impaired glucose tolerance 06/12/2010  . SCHIZOPHRENIA 06/25/2008    Family History  family history includes Hypertension in her other; Stroke in her other.  Prior Rehab/Hospitalizations:  Has the patient had major surgery during 100 days prior to admission? No  Current Medications   Current Facility-Administered Medications:  .  acetaminophen (TYLENOL) tablet 650 mg, 650 mg, Oral, Q4H PRN, 650 mg at 05/07/17 1224 **OR** acetaminophen (TYLENOL) solution 650 mg, 650 mg, Per Tube, Q4H PRN **OR** acetaminophen (TYLENOL) suppository 650 mg, 650 mg, Rectal, Q4H PRN, Opyd, Timothy S,  MD .  aspirin EC tablet 81 mg, 81 mg, Oral, Daily, Opyd, Ilene Qua, MD, 81 mg at 05/08/17 0940 .  benztropine (COGENTIN) tablet 2 mg, 2 mg, Oral, Daily, Opyd, Ilene Qua, MD, 2 mg at 05/08/17 0940 .  cefTRIAXone (ROCEPHIN) 1 g in sodium chloride 0.9 % 100 mL IVPB, 1 g, Intravenous, Q0600, Velvet Bathe, MD, Last Rate: 200 mL/hr at 05/08/17 0530, 1 g at 05/08/17 0530 .  escitalopram (LEXAPRO) tablet 5 mg, 5 mg, Oral, Daily, Opyd, Ilene Qua, MD, 5 mg at 05/08/17 0940 .  haloperidol (HALDOL) tablet 15 mg, 15 mg, Oral, Daily, Opyd, Timothy S, MD, 15 mg at 05/08/17 0940 .  heparin ADULT infusion 100 units/mL (25000 units/260mL sodium chloride 0.45%), 950 Units/hr, Intravenous, Continuous, Vega, Orlando, MD, Last Rate: 9.5 mL/hr at 05/08/17 0536, 950 Units/hr at 05/08/17 0536 .  hydrALAZINE (APRESOLINE) injection 5 mg, 5 mg, Intravenous, Q6H PRN, Velvet Bathe, MD .  pravastatin (PRAVACHOL) tablet 40 mg, 40 mg, Oral, q1800, Opyd, Ilene Qua, MD, 40 mg at 05/07/17 1729 .  senna-docusate (Senokot-S) tablet 1 tablet, 1 tablet, Oral, QHS PRN, Opyd, Ilene Qua, MD .  traMADol (ULTRAM) tablet 50-100 mg, 50-100 mg, Oral, Q6H PRN, Velvet Bathe, MD, 50 mg at 05/08/17 0940 .  vitamin B-12 (CYANOCOBALAMIN) tablet 1,000 mcg, 1,000 mcg, Oral, Daily, Rosalin Hawking, MD, 1,000 mcg at 05/08/17 0940  Patients Current Diet: Fall precautions Diet Heart Room service appropriate? Yes; Fluid consistency: Thin  Precautions / Restrictions Precautions Precautions: Fall Precaution Comments: watch HR Restrictions Weight Bearing Restrictions: No   Has the patient had 2 or more falls or a fall with injury in the past year?No  Prior Activity Level Limited Community (1-2x/wk): Prior to admission patient was fully independent, but did not like to go out frequently.  She and her spouse worked together on most things as a Therapist, occupational.    Development worker, international aid / Paramedic Devices/Equipment: None Home Equipment:  None  Prior Device Use: Indicate devices/aids used by the patient prior to current illness, exacerbation or injury? None of the above  Prior Functional Level Prior Function Level of Independence: Independent Comments: Drives. Not working. Wears glasses but needs a new prescription.  Self Care: Did the patient need help bathing, dressing, using the toilet or eating? Independent  Indoor Mobility: Did the patient need assistance with walking from room to room (with or without device)? Independent  Stairs: Did the patient need assistance with internal or external stairs (with or without device)? Independent  Functional Cognition: Did the patient need help planning regular tasks such as shopping or remembering to take medications? Independent  Current Functional Level Cognition  Arousal/Alertness: Awake/alert Overall Cognitive Status: Impaired/Different from baseline Current Attention Level: Selective Orientation Level: Oriented X4 Following Commands: Follows multi-step commands with increased time General Comments: Difficulty with dual tasking while walking- hard time counting down by 5s from 100 during gait resulting in decreased speed and imbalance.  Attention: Selective, Sustained Sustained Attention: Impaired Sustained Attention Impairment: Verbal basic, Functional basic Selective Attention: Impaired Selective Attention Impairment: Functional basic, Verbal basic Memory: Impaired Memory Impairment: Decreased recall of new information(delayed recall 0/5) Awareness: Impaired Awareness Impairment: Intellectual impairment Problem Solving: Impaired Problem Solving Impairment: Functional basic(names 2/3 ways to make $13) Executive Function: Sequencing, Decision Making, Initiating Sequencing: Impaired Sequencing Impairment: Functional basic Decision Making: Impaired Decision Making Impairment: Functional basic Initiating: Impaired Initiating Impairment: Verbal  basic Safety/Judgment: Impaired    Extremity Assessment (includes Sensation/Coordination)  Upper Extremity Assessment: LUE deficits/detail, RUE deficits/detail RUE Deficits / Details: Poor coorindation and slow moving. Able to perform finger-to nose test with significant amount of time. Decreased grasp strength (Lweaker than R). Poor isolation of movement. RUE Coordination: decreased fine motor, decreased gross motor LUE Deficits / Details: Poor coorindation and slow moving. Able to perform finger-to nose test with significant amount of time. Decreased grasp strength (Lweaker than R). Poor isolation of movement. LUE Coordination: decreased fine motor, decreased gross motor  Lower Extremity Assessment: Defer to PT evaluation RLE Deficits / Details: Grossly ~1/5 DF/PF, 3+/5 knee extension/flexion RLE Sensation: WNL LLE Deficits / Details: Grossly ~4/5 throughout.  LLE Sensation: WNL    ADLs  Overall ADL's : Needs assistance/impaired Eating/Feeding: Set up, Sitting Grooming: Oral care, Min guard, Standing Grooming Details (indicate cue type and reason): Pt with two LOB due to posterior lean and required Min A to correct balance. Pt demonstrating poor cognition and required significant amount of time to complete task. Pt demosntrating poor working memory as seen by forgetting whether to push or pull faucet to turn on/off. Pt repeating process several times with poor recalling of whether to push or pull.  Upper Body Bathing: Minimal assistance, Sitting Lower Body Bathing: Moderate assistance, Sit to/from stand Upper Body Dressing : Minimal assistance, Sitting Lower Body Dressing: Moderate assistance, Sit to/from stand Toilet Transfer: Minimal assistance, Ambulation, Regular Toilet Toilet Transfer Details (indicate cue type and reason): Pt requiring Min A for balance. Noted posterior lean throughout session Toileting- Clothing Manipulation and Hygiene: Min guard, Sit to/from stand Functional  mobility during ADLs: Minimal assistance, Cueing for safety General ADL Comments: ambulated to bathroom; stood at sink twice:  brushed teeth then washed hands after toileting.  Pt pushed IV pole; steadying assistance given.  Pt had difficulty controlling where water went and was unaware of this; did not clean sink. Pt did attend to L side without cues. She used bil hands to open containers but tends to position L awkwardly when not actively engaging it.      Mobility  Overal bed mobility: Needs Assistance Bed Mobility: Supine to Sit, Sit to Supine Supine to sit: Modified independent (Device/Increase time), HOB elevated Sit to supine: Modified independent (Device/Increase time), HOB elevated General bed mobility comments: No assist needed.     Transfers  Overall transfer level: Needs assistance Equipment used: None Transfers: Sit to/from Stand Sit to Stand: Min guard Stand pivot transfers: Min assist General transfer comment: Min guard for safety. Very guarded with all movements. Stood from [employer], declined transferring to chair.     Ambulation / Gait / Stairs / Wheelchair Mobility  Ambulation/Gait Ambulation/Gait assistance: Museum/gallery curator (Feet): 120 Feet Assistive device: None Gait Pattern/deviations: Step-through pattern, Decreased stride length, Leaning posteriorly, Narrow base of support, Shuffle General Gait Details: Very slow, guarded gait with stiff posture, cues for arm swing bilaterally. Cues to increase cadence. Needed to stop multiple times during cognitive tasks. Min A at  times due to posterior lean esp when dual tasking.  Gait velocity: decreased Gait velocity interpretation: Below normal speed for age/gender Stairs: Yes Stairs assistance: Min guard Stair Management: Alternating pattern, Two rails Number of Stairs: 3(+ 2 steps x2 bouts) General stair comments: Cues for technique.     Posture / Balance Balance Overall balance assessment: Needs  assistance Sitting-balance support: Feet supported, No upper extremity supported Sitting balance-Leahy Scale: Good Standing balance support: During functional activity Standing balance-Leahy Scale: Good Standing balance comment: Able to stand at sink and brush teeth with supervision for safety.  High level balance activites: Backward walking, Direction changes, Turns, Sudden stops, Head turns High Level Balance Comments: Able to perform above with deviations in gait requiring external support to prevent fall; LOB walking backwards. Veering to right/left with head turns.     Special needs/care consideration BiPAP/CPAP: No CPM: No Continuous Drip IV: Heparin  Dialysis: No        Life Vest: No Oxygen: No Special Bed: No Trach Size: No Wound Vac (area): No       Skin: WDL, bruise to left lower extremity afrom 1 fall just prior to admission  Bowel mgmt: None since admission  Bladder mgmt: Continent  Diabetic mgmt: No     Previous Home Environment Living Arrangements: Spouse/significant other  Lives With: Spouse Available Help at Discharge: Family, Available 24 hours/day Type of Home: House Home Layout: One level Home Access: Level entry Bathroom Shower/Tub: Chiropodist: Standard Home Care Services: No  Discharge Living Setting Plans for Discharge Living Setting: Patient's home, Lives with (comment)(Spouse, Arnold) Type of Home at Discharge: House Discharge Home Layout: One level Discharge Home Access: Level entry(with door threshold) Discharge Bathroom Shower/Tub: Tub/shower unit, Curtain Discharge Bathroom Toilet: Standard Discharge Bathroom Accessibility: No Does the patient have any problems obtaining your medications?: (report that at times meds have been expensive but notify MD)  Social/Family/Support Systems Patient Roles: Parent, Spouse Contact Information: Spouse: Roselie Awkward [phone] Anticipated Caregiver: Spouse Anticipated Caregiver's Contact  Information: see above Ability/Limitations of Caregiver: none, 2 local daughter to help PRN as well Caregiver Availability: 24/7 Discharge Plan Discussed with Primary Caregiver: Yes Is Caregiver In Agreement with Plan?: Yes Does Caregiver/Family have Issues with Lodging/Transportation while Pt is in Rehab?: No  Goals/Additional Needs Patient/Family Goal for Rehab: PT/OT/SLP: Mod I-Supervision  Expected length of stay: 8-11 days  Cultural Considerations: Christian, attends [employer] and requests no therapy on Sundays  Dietary Needs: Heart healthy diet restrictions  Equipment Needs: TBD Pt/Family Agrees to Admission and willing to participate: Yes Program Orientation Provided & Reviewed with Pt/Caregiver Including Roles  & Responsibilities: Yes  Barriers to Discharge: Medical stability, Other (comments)(Plan for IV heparin )  Decrease burden of Care through IP rehab admission: No  Possible need for SNF placement upon discharge: No  Patient Condition: This patient's condition remains as documented in the consult dated 05/07/17, in which the Rehabilitation Physician determined and documented that the patient's condition is appropriate for intensive rehabilitative care in an inpatient rehabilitation facility. Will admit to inpatient rehab today.  Preadmission Screen Completed By:  Gunnar Fusi, 05/08/2017 3:05 PM ______________________________________________________________________   Discussed status with Dr. Naaman Plummer on 05/08/17 at 1530 and received telephone approval for admission today.  Admission Coordinator:  Gunnar Fusi, time 1530/Date 05/08/17

## 2017-05-09 ENCOUNTER — Inpatient Hospital Stay (HOSPITAL_COMMUNITY): Payer: Medicare Other | Admitting: Occupational Therapy

## 2017-05-09 ENCOUNTER — Inpatient Hospital Stay (HOSPITAL_COMMUNITY): Payer: Medicare Other | Admitting: Physical Therapy

## 2017-05-09 ENCOUNTER — Inpatient Hospital Stay (HOSPITAL_COMMUNITY): Payer: Medicare Other | Admitting: Speech Pathology

## 2017-05-09 DIAGNOSIS — D62 Acute posthemorrhagic anemia: Secondary | ICD-10-CM

## 2017-05-09 DIAGNOSIS — E8809 Other disorders of plasma-protein metabolism, not elsewhere classified: Secondary | ICD-10-CM

## 2017-05-09 DIAGNOSIS — I639 Cerebral infarction, unspecified: Secondary | ICD-10-CM

## 2017-05-09 DIAGNOSIS — C799 Secondary malignant neoplasm of unspecified site: Secondary | ICD-10-CM

## 2017-05-09 DIAGNOSIS — E871 Hypo-osmolality and hyponatremia: Secondary | ICD-10-CM

## 2017-05-09 LAB — CBC WITH DIFFERENTIAL/PLATELET
Basophils Absolute: 0 10*3/uL (ref 0.0–0.1)
Basophils Relative: 0 %
EOS ABS: 0.1 10*3/uL (ref 0.0–0.7)
EOS PCT: 1 %
HCT: 30 % — ABNORMAL LOW (ref 36.0–46.0)
Hemoglobin: 9.8 g/dL — ABNORMAL LOW (ref 12.0–15.0)
LYMPHS ABS: 1.1 10*3/uL (ref 0.7–4.0)
LYMPHS PCT: 11 %
MCH: 25.6 pg — AB (ref 26.0–34.0)
MCHC: 32.7 g/dL (ref 30.0–36.0)
MCV: 78.3 fL (ref 78.0–100.0)
MONO ABS: 1.1 10*3/uL — AB (ref 0.1–1.0)
Monocytes Relative: 11 %
Neutro Abs: 8.1 10*3/uL — ABNORMAL HIGH (ref 1.7–7.7)
Neutrophils Relative %: 77 %
PLATELETS: 468 10*3/uL — AB (ref 150–400)
RBC: 3.83 MIL/uL — AB (ref 3.87–5.11)
RDW: 16.1 % — AB (ref 11.5–15.5)
WBC: 10.4 10*3/uL (ref 4.0–10.5)

## 2017-05-09 LAB — COMPREHENSIVE METABOLIC PANEL
ALT: 12 U/L — ABNORMAL LOW (ref 14–54)
ANION GAP: 5 (ref 5–15)
AST: 28 U/L (ref 15–41)
Albumin: 1.8 g/dL — ABNORMAL LOW (ref 3.5–5.0)
Alkaline Phosphatase: 71 U/L (ref 38–126)
BUN: 6 mg/dL (ref 6–20)
CHLORIDE: 97 mmol/L — AB (ref 101–111)
CO2: 24 mmol/L (ref 22–32)
CREATININE: 0.94 mg/dL (ref 0.44–1.00)
Calcium: 8.3 mg/dL — ABNORMAL LOW (ref 8.9–10.3)
Glucose, Bld: 100 mg/dL — ABNORMAL HIGH (ref 65–99)
POTASSIUM: 4.1 mmol/L (ref 3.5–5.1)
Sodium: 126 mmol/L — ABNORMAL LOW (ref 135–145)
Total Bilirubin: 0.5 mg/dL (ref 0.3–1.2)
Total Protein: 5.4 g/dL — ABNORMAL LOW (ref 6.5–8.1)

## 2017-05-09 MED ORDER — ENOXAPARIN SODIUM 80 MG/0.8ML ~~LOC~~ SOLN
80.0000 mg | Freq: Two times a day (BID) | SUBCUTANEOUS | Status: DC
  Administered 2017-05-09 – 2017-05-10 (×2): 80 mg via SUBCUTANEOUS
  Filled 2017-05-09 (×2): qty 0.8

## 2017-05-09 MED ORDER — PRO-STAT SUGAR FREE PO LIQD
30.0000 mL | Freq: Two times a day (BID) | ORAL | Status: DC
  Administered 2017-05-09 – 2017-05-12 (×8): 30 mL via ORAL
  Filled 2017-05-09 (×9): qty 30

## 2017-05-09 NOTE — Progress Notes (Signed)
Physical Medicine and Rehabilitation Consult Reason for Consult: Right side weakness  Referring Physician:[facility]   HPI: [name] [name] is a 67 y.o. right-handed female with history of hypertension, schizophrenia.  Presented 05/04/2017 with blurred vision and right-sided weakness with decrease in appetite.  Per chart review patient lives with spouse independent prior to admission. Husband can assist as needed. Cranial CT scan showed small bilateral occipital to early subacute infarcts without hemorrhage or mass-effect.  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  A CT of the chest positive for a large burden of what appeared to be both chronic thrombus and acute pulmonary embolism in the lungs bilaterally left greater than right as well as multiple small pulmonary nodules scattered throughout the lungs bilaterally measuring 2 - 5 mm in size concerning for metastatic lesions.  Vascular study lower extremity positive for DVT right lower extremity in the femoral vein, posterior tibial veins.  MRI demonstrated multiple additional subcentimeter foci of late subacute to chronic infarcts scattered throughout the supratentorial and infratentorial brain with few additional punctate foci and left frontal, right parietal and left thalamus.  Patient was placed on intravenous heparin for pulmonary emboli.  TCD bubble study negative for PFO.  Oncology has been consulted in regards to lung mass.  Therapy evaluations have been completed and ongoing.  MD has requested physical medicine rehab consult.   Review of Systems  Constitutional: Positive for malaise/fatigue. Negative for fever.  HENT: Negative for hearing loss.   Eyes: Positive for blurred vision.  Respiratory: Positive for cough and shortness of breath.   Cardiovascular: Positive for leg swelling. Negative for chest pain and palpitations.  Gastrointestinal: Positive for constipation. Negative for nausea  and vomiting.  Genitourinary: Negative for dysuria, flank pain and hematuria.  Musculoskeletal: Positive for myalgias.  Neurological: Positive for focal weakness.  Psychiatric/Behavioral:       Schizophrenia  All other systems reviewed and are negative.      Past Medical History:  Diagnosis Date  . CTS (carpal tunnel syndrome)    bilateral  . FATIGUE 08/27/2008  . GLUCOSE INTOLERANCE 06/25/2008  . HYPERLIPIDEMIA 06/25/2008  . HYPERTENSION 06/25/2008  . Impaired glucose tolerance 06/12/2010  . SCHIZOPHRENIA 06/25/2008        Past Surgical History:  Procedure Laterality Date  . DILATION AND CURETTAGE OF UTERUS  1999   hx of after miscarriage  . s/p cervical procedure  1995   LEEP        Family History  Problem Relation Age of Onset  . Hypertension Other   . Stroke Other    Social History:  reports that she has quit smoking. She quit smokeless tobacco use about 41 years ago. She reports that she does not drink alcohol. Her drug history is not on file. Allergies:      Allergies  Allergen Reactions  . Nabumetone Rash         Medications Prior to Admission  Medication Sig Dispense Refill  . benztropine (COGENTIN) 2 MG tablet Take 1 tablet (2 mg total) by mouth daily. 90 tablet 3  . colchicine 0.6 MG tablet 1 tab by mouth as needed for gout pain and swelling with repeat 1 per hour until improved or diarrhea 40 tablet 1  . escitalopram (LEXAPRO) 5 MG tablet Take 5 mg by mouth daily.    . haloperidol (HALDOL) 10 MG tablet Take 15 mg by mouth daily. Take 1 1/2 tab by mouth daily    .  lisinopril-hydrochlorothiazide (PRINZIDE,ZESTORETIC) 20-12.5 MG tablet Take 2 tablets by mouth daily. 180 tablet 3  . lovastatin (MEVACOR) 40 MG tablet Take 1 tablet (40 mg total) by mouth daily. 90 tablet 3  . aspirin 81 MG EC tablet Take 81 mg by mouth daily.        Home: Home Living Family/patient expects to be discharged to:: Private residence Living Arrangements:  Spouse/significant other Available Help at Discharge: Family, Available 24 hours/day Type of Home: House Home Access: Level entry [place]: One level Bathroom Shower/Tub: Chiropodist: [place]: None  Lives With: Spouse  Functional History: Prior Function Level of Independence: Independent Comments: Drives. Not working. Wears glasses but needs a new prescription. Functional Status:  Mobility: Bed Mobility Overal bed mobility: Needs Assistance Bed Mobility: Supine to Sit, Sit to Supine Supine to sit: Supervision, HOB elevated Sit to supine: Supervision, HOB elevated General bed mobility comments: No assist needed.  Transfers Overall transfer level: Needs assistance Equipment used: None Transfers: Sit to/from Stand Sit to Stand: Min guard Stand pivot transfers: Min assist General transfer comment: Min guard for safety. Stood from [employer]. Not using RUE to help push to stand. Ambulation/Gait Ambulation/Gait assistance: Min guard Ambulation Distance (Feet): 120 Feet Assistive device: None Gait Pattern/deviations: Step-through pattern, Decreased stride length, Leaning posteriorly, Narrow base of support General Gait Details: Very slow, guarded gait with stiff posture and no arm swing. Cues to increase cadence. Needed to stop multiple times during cognitive tasks. Min A at times due to posterior lean. HR ranged from 115-129 bpm.  Gait velocity: decreased Gait velocity interpretation: Below normal speed for age/gender  ADL: ADL Overall ADL's : Needs assistance/impaired Eating/Feeding: Set up, Sitting Grooming: Oral care, Minimal assistance, Standing Grooming Details (indicate cue type and reason): Pt with two LOB due to posterior lean and required Min A to correct balance. Pt demonstrating poor cognition and required significant amount of time to complete task. Pt demosntrating poor working memory as seen by forgetting whether to push or pull  faucet to turn on/off. Pt repeating process several times with poor recalling of whether to push or pull.  Upper Body Bathing: Minimal assistance, Sitting Lower Body Bathing: Moderate assistance, Sit to/from stand Upper Body Dressing : Minimal assistance, Sitting Lower Body Dressing: Moderate assistance, Sit to/from stand Toilet Transfer: Minimal assistance, Ambulation(Simulated to recliner. ) Toilet Transfer Details (indicate cue type and reason): Pt requiring Min A for balance. Noted posterior lean throughout session Functional mobility during ADLs: Minimal assistance, Cueing for safety General ADL Comments: Pt with decreased functional performance due to deficits in vision, cognition, and balance.  Cognition: Cognition Overall Cognitive Status: Impaired/Different from baseline Arousal/Alertness: Awake/alert Orientation Level: Oriented X4 Attention: Selective, Sustained Sustained Attention: Impaired Sustained Attention Impairment: Verbal basic, Functional basic Selective Attention: Impaired Selective Attention Impairment: Functional basic, Verbal basic Memory: Impaired Memory Impairment: Decreased recall of new information(delayed recall 0/5) Awareness: Impaired Awareness Impairment: Intellectual impairment Problem Solving: Impaired Problem Solving Impairment: Functional basic(names 2/3 ways to make $13) Executive Function: Sequencing, Decision Making, Initiating Sequencing: Impaired Sequencing Impairment: Functional basic Decision Making: Impaired Decision Making Impairment: Functional basic Initiating: Impaired Initiating Impairment: Verbal basic Safety/Judgment: Impaired Cognition Arousal/Alertness: Awake/alert Behavior During Therapy: Flat affect Overall Cognitive Status: Impaired/Different from baseline Area of Impairment: Attention, Problem solving, Following commands, Awareness Current Attention Level: Selective Memory: Decreased short-term memory Following  Commands: Follows multi-step commands with increased time Awareness: Emergent Problem Solving: Slow processing, Requires verbal cues General Comments: Faster to respond to  questions today and more conversive. Difficulty with dual tasking. Not able to count down by 7s from 100 during gait- balance worsens and RLE drags more. Difficulty naming veggies while walking as well.   Blood pressure (!) 131/92, pulse (!) 112, temperature 98.2 F (36.8 C), temperature source Oral, resp. rate 20, height 5\' 8"  (1.727 m), weight 79.8 kg (175 lb 14.8 oz), SpO2 100 %. Physical Exam  Vitals reviewed. HENT:  Head: Normocephalic.  Eyes: EOM are normal.  Neck: Normal range of motion. Neck supple. No thyromegaly present.  Cardiovascular: Normal rate and regular rhythm.  Respiratory:  Fair inspiratory effort clear to auscultation  GI: Soft. Bowel sounds are normal. She exhibits no distension.  Neurological:  Mood is flat but appropriate. She would not initiate conversation but provides her name and age. Follow simple commands. Processing delays.  Her husband was at bedside. RUE 4/5 prox to distal. RLE 4/5 prox to distal. LUE and LLE 5/5 grossly. Sensory exam grossly intact.   Skin: Skin is warm and dry.  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 Does the need for close, 24 hr/day medical supervision in concert with the patient's rehab needs make it unreasonable for this patient to be served in a less intensive setting? Yes 2. Co-Morbidities requiring supervision/potential complications: AKI, HTN, shizophrenia 3. Due to bladder management, bowel management, safety, skin/wound care, disease management, medication administration, pain management and patient education, does the patient require 24 hr/day rehab nursing? Yes 4. Does the patient require coordinated care of a physician, rehab  nurse, PT (1-2 hrs/day, 5 days/week), OT (1-2 hrs/day, 5 days/week) and SLP (1-2 hrs/day, 5 days/week) to address physical and functional deficits in the context of the above medical diagnosis(es)? Yes Addressing deficits in the following areas: balance, endurance, locomotion, strength, transferring, bowel/bladder control, bathing, dressing, feeding, grooming, toileting, cognition and psychosocial support 5. Can the patient actively participate in an intensive therapy program of at least 3 hrs of therapy per day at least 5 days per week? Yes 6. The potential for patient to make measurable gains while on inpatient rehab is excellent 7. Anticipated functional outcomes upon discharge from inpatient rehab are modified independent and supervision  with PT, modified independent and supervision with OT, modified independent and supervision with SLP. 8. Estimated rehab length of stay to reach the above functional goals is: 8-11 days 9. Anticipated D/C setting: Home 10. Anticipated post D/C treatments: HH therapy and Outpatient therapy 11. Overall Rehab/Functional Prognosis: excellent  RECOMMENDATIONS: This patient's condition is appropriate for continued rehabilitative care in the following setting: CIR Patient has agreed to participate in recommended program. Yes Note that insurance prior authorization may be required for reimbursement for recommended care.  Comment: Rehab Admissions Coordinator to follow up.  Thanks,  Meredith Staggers, MD, Mellody Drown    Lavon Paganini Angiulli, PA-C 05/07/2017          Revision History                        Routing History

## 2017-05-09 NOTE — Progress Notes (Signed)
Social Work Patient ID: [name] [name], female   DOB: May 14, 1950, 67 y.o.   MRN: [id number]  Met with pt and husband to discuss team conference mod/i-supervision level and target discharge date 3/24. MD to work on which anticoagulate to go home on and bladder issues. Now having to be I & O cathed, every 4 hrs. Husband is here and plans to be and participate in therapies with pt. Work toward discharge Sun.

## 2017-05-09 NOTE — Evaluation (Signed)
Physical Therapy Assessment and Plan  Patient Details  Name: [name] [name] MRN: [id number] Date of Birth: 1950-10-11  PT Diagnosis: Abnormality of gait, Difficulty walking, Hemiparesis dominant, Impaired cognition and Muscle weakness Rehab Potential: Excellent ELOS: 4-6 days   Today's Date: 05/09/2017 PT Individual Time: 1100-1200 PT Individual Time Calculation (min): 60 min    Problem List:  Patient Active Problem List   Diagnosis Date Noted  . Acute blood loss anemia   . Hypoalbuminemia   . Hyponatremia   . Metastatic carcinoma ([place])   . Occipital infarction ([place]) 05/08/2017  . Ascites   . DNR (do not resuscitate)   . Palliative care encounter   . Stroke ([place]) 05/04/2017  . AKI (acute kidney injury) ([place]) 05/04/2017  . Normocytic anemia 05/04/2017  . Acute lower UTI 05/04/2017  . Cerebral infarction ([place])   . Acute deep vein thrombosis (DVT) of popliteal vein of right lower extremity (HCC)   . Acute saddle pulmonary embolism with acute cor pulmonale (HCC)   . Acute gouty arthritis 05/01/2017  . Impaired glucose tolerance 06/12/2010  . Preventative health care 06/12/2010  . FATIGUE 08/27/2008  . Hyperlipidemia 06/25/2008  . Schizophrenia ([place]) 06/25/2008  . Essential hypertension 06/25/2008    Past Medical History:  Past Medical History:  Diagnosis Date  . CTS (carpal tunnel syndrome)    bilateral  . FATIGUE 08/27/2008  . GLUCOSE INTOLERANCE 06/25/2008  . HYPERLIPIDEMIA 06/25/2008  . HYPERTENSION 06/25/2008  . Impaired glucose tolerance 06/12/2010  . SCHIZOPHRENIA 06/25/2008   Past Surgical History:  Past Surgical History:  Procedure Laterality Date  . DILATION AND CURETTAGE OF UTERUS  1999   hx of after miscarriage  . s/p cervical procedure  1995   LEEP    Assessment & Plan Clinical Impression: [name] [name] a 67 y.o.right-handedfemalewith history of hypertension, schizophrenia. 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 A CT of the chest positive for a large burden of what appeared to be both chronic thrombus and acute pulmonary embolism in the lungs bilaterally left greater than right as well as multiple small pulmonary nodules scattered throughout the lungs bilaterally measuring 2 -5 mm in size concerning for metastatic lesions.Also noted large volume of ascites in the abdomen likely malignant given the apparent omental nodularity and underwent paracentesis 05/06/2017 with a total of 3.8 L of yellow fluid removed.Vascular study lower extremity positive for DVT right lower extremity in the femoral vein, posterior tibial veins. MRI demonstrated multiple additional subcentimeter foci of late subacute to chronic infarcts scattered throughout the supratentorial and infratentorial brain with few additional punctate foci and left frontal, right parietal and left thalamus. Patient was placed on intravenous heparin for pulmonary emboliand await plan for long-term anticoagulation. TCD bubble study negative for PFO.Patient has declined any further workup for metastatic carcinoma and consultation obtained with palliative care.Therapy evaluations have been completed and ongoing. MD has requested physical medicine rehab consult. Patient was admitted for a comprehensive rehabilitation program. Patient transferred to CIR on 05/08/2017 .   Patient currently requires min with mobility secondary to muscle weakness, decreased cardiorespiratoy endurance, decreased visual acuity, decreased initiation and delayed processing and decreased standing balance, decreased postural control, decreased balance strategies  and hemiparesis.  Prior to  hospitalization, patient was independent  with mobility and lived with Spouse in a House home.  Home access is  Level entry.  Patient will benefit from skilled PT intervention to maximize safe functional mobility, minimize fall risk and decrease caregiver burden for planned discharge home with 24 hour supervision.  Anticipate patient will benefit from follow up OP at discharge.  PT - End of Session Activity Tolerance: Tolerates < 10 min activity with changes in vital signs Endurance Deficit: Yes Endurance Deficit Description: HR elevates significantly with short duration mobility activities PT Assessment Rehab Potential (ACUTE/IP ONLY): Excellent PT Patient demonstrates impairments in the following area(s): Balance;Behavior;Endurance;Motor;Safety PT Transfers Functional Problem(s): Bed Mobility;Bed to Chair;Car;Furniture;Floor PT Locomotion Functional Problem(s): Ambulation;Stairs PT Plan PT Intensity: Minimum of 1-2 x/day ,45 to 90 minutes PT Frequency: 5 out of 7 days PT Duration Estimated Length of Stay: 4-6 days PT Treatment/Interventions: Visual/perceptual remediation/compensation;Therapeutic Exercise;UE/LE Strength taining/ROM;UE/LE Coordination activities;Stair training;Patient/family education;Community reintegration;Cognitive remediation/compensation;Neuromuscular re-education;Disease management/prevention;Balance/vestibular training;Ambulation/gait training;Discharge planning;Functional mobility training;Psychosocial support;Therapeutic Activities;Pain management PT Transfers Anticipated Outcome(s): modI PT Locomotion Anticipated Outcome(s): S overall, no AD PT Recommendation Recommendations for Other Services: Therapeutic Recreation consult;Neuropsych consult Therapeutic Recreation Interventions: Pet therapy Follow Up Recommendations: Outpatient PT Patient destination: Home Equipment Recommended: None recommended by PT  Skilled Therapeutic  Intervention Pt received seated in bed with husband present, c/o pain as below and agreeable to treatment. PT evaluation completed with min guard/minA overall as described below; limited by mild R hemiparesis and occasional minor LOBs with dynamic gait. Performed balance assessments as below indicating high risk for falls; educated pt and husband in risk for falls and goals of PT to address balance and endurance deficits. Educated pt and husband on rehab process, goals, estimated length of stay, and update to pt following today's team conference regarding target d/c date. Returned to bed at end of session per pt preference; educated on goal to increase OOB tolerance, pt and husband verbalize understanding. All needs in reach at end of session, husband present.   PT Evaluation Precautions/Restrictions Precautions Precautions: Fall Precaution Comments: watch HR- elevates quickly with mobility activities Restrictions Weight Bearing Restrictions: No General Chart Reviewed: Yes Response to Previous Treatment: Patient reporting fatigue but able to participate. Family/Caregiver Present: Yes Vital Signs Pain Pain Assessment Pain Assessment: 0-10 Pain Score: 3  Pain Type: Acute pain Pain Location: Chest Pain Orientation: Left;Anterior;Upper Pain Descriptors / Indicators: Nagging Pain Frequency: Intermittent Pain Onset: Gradual Pain Intervention(s): Medication (See eMAR) Multiple Pain Sites: No Home Living/Prior Functioning Home Living Living Arrangements: Spouse/significant other Available Help at Discharge: Family;Available 24 hours/day Type of Home: House Home Access: Level entry Home Layout: One level  Lives With: Spouse Prior Function Level of Independence: Independent with basic ADLs;Independent with gait;Independent with homemaking with ambulation;Independent with transfers  Able to Take Stairs?: Yes Driving: Yes Comments: Drives. Not working. Wears glasses but needs a new  prescription. Vision/Perception  Perception Perception: Within Functional Limits Praxis Praxis: Intact  Cognition Overall Cognitive Status: Within Functional Limits for tasks assessed Arousal/Alertness: Awake/alert Attention: Sustained;Selective Sustained Attention: Appears intact Selective Attention: Appears intact Memory: Appears intact Sequencing: Appears intact Comments: Pt able to sequence through selfcare tasks without difficulty and maintain selective attention as well.  No glaring deficits noted Sensation Sensation Light Touch: Appears Intact Stereognosis: Not tested Hot/Cold: Not tested Proprioception: Appears Intact Coordination Gross Motor Movements are Fluid and Coordinated: Yes Heel Shin Test: WFL BLE Motor  Motor Motor: Within Functional Limits Motor - Skilled Clinical Observations: mild R hemiparesis  Mobility Bed Mobility  Bed Mobility: Sit to Supine;Supine to Sit Supine to Sit: 6: Modified independent (Device/Increase time) Sit to Supine: 6: Modified independent (Device/Increase time) Transfers Transfers: Yes Sit to Stand: 4: Min guard Stand Pivot Transfers: 4: Min guard Locomotion  Ambulation Ambulation: Yes Ambulation/Gait Assistance: 4: Min guard Ambulation Distance (Feet): 90 Feet Assistive device: None Gait Gait: Yes Gait Pattern: Impaired Gait Pattern: Shuffle Gait velocity: decreased Stairs / Additional Locomotion Stairs: Yes Stairs Assistance: 4: Min guard Stair Management Technique: Two rails;Alternating pattern;Forwards Number of Stairs: 8 Height of Stairs: 6 Ramp: 4: Min assist Curb: 4: Min Administrator Mobility: No  Trunk/Postural Assessment  Cervical Assessment Cervical Assessment: Within Functional Limits Thoracic Assessment Thoracic Assessment: Within Functional Limits Lumbar Assessment Lumbar Assessment: Within Functional Limits Postural Control Postural Control: Within Functional Limits   Balance Balance Balance Assessed: Yes Standardized Balance Assessment Standardized Balance Assessment: Berg Balance Test;Timed Up and Go Test Berg Balance Test Sit to Stand: Able to stand without using hands and stabilize independently Standing Unsupported: Able to stand safely 2 minutes Sitting with Back Unsupported but Feet Supported on Floor or Stool: Able to sit safely and securely 2 minutes Stand to Sit: Sits safely with minimal use of hands Transfers: Able to transfer safely, minor use of hands Standing Unsupported with Eyes Closed: Able to stand 10 seconds safely Standing Ubsupported with Feet Together: Able to place feet together independently and stand for 1 minute with supervision From Standing, Reach Forward with Outstretched Arm: Can reach confidently >25 cm (10") From Standing Position, Pick up Object from Floor: Able to pick up shoe, needs supervision From Standing Position, Turn to Look Behind Over each Shoulder: Looks behind from both sides and weight shifts well Turn 360 Degrees: Able to turn 360 degrees safely but slowly Standing Unsupported, Alternately Place Feet on Step/Stool: Able to stand independently and safely and complete 8 steps in 20 seconds Standing Unsupported, One Foot in Front: Able to plae foot ahead of the other independently and hold 30 seconds Standing on One Leg: Tries to lift leg/unable to hold 3 seconds but remains standing independently Total Score: 48 Timed Up and Go Test TUG: Normal TUG;Cognitive TUG Normal TUG (seconds): 21 Cognitive TUG (seconds): 50 Static Sitting Balance Static Sitting - Balance Support: Feet supported;No upper extremity supported Static Sitting - Level of Assistance: 6: Modified independent (Device/Increase time) Dynamic Sitting Balance Dynamic Sitting - Balance Support: No upper extremity supported;Feet supported;During functional activity Dynamic Sitting - Level of Assistance: 6: Modified independent (Device/Increase  time) Static Standing Balance Static Standing - Balance Support: During functional activity;No upper extremity supported Static Standing - Level of Assistance: 5: Stand by assistance Static Stance: Eyes closed Static Stance: Eyes Closed: x30 sec with S Dynamic Standing Balance Dynamic Standing - Balance Support: During functional activity;No upper extremity supported Dynamic Standing - Level of Assistance: 5: Stand by assistance Dynamic Standing - Balance Activities: Lateral lean/weight shifting;Forward lean/weight shifting;Reaching for objects Extremity Assessment  RUE Assessment RUE Assessment: Exceptions to [facility] for all joints, overall shoulder strength 3+/5, elbow and grip strength 4/5, FM coordination WFLS) LUE Assessment LUE Assessment: Within Functional Limits RLE Assessment RLE Assessment: Within Functional Limits(4+/5 to 5/5 throughout) LLE Assessment LLE Assessment: Within Functional Limits(4+/5 to 5/5 throughout)   See Function Navigator for Current Functional Status.   Refer to Care Plan for Long Term Goals  Recommendations for other services: Neuropsych and Therapeutic Recreation  Pet therapy  Discharge Criteria: Patient will be discharged from PT if patient refuses  treatment 3 consecutive times without medical reason, if treatment goals not met, if there is a change in medical status, if patient makes no progress towards goals or if patient is discharged from hospital.  The above assessment, treatment plan, treatment alternatives and goals were discussed and mutually agreed upon: by patient and by family  Luberta Mutter 05/09/2017, 12:10 PM

## 2017-05-09 NOTE — Patient Care Conference (Signed)
Inpatient RehabilitationTeam Conference and Plan of Care Update Date: 05/09/2017   Time: 2:15 PM    Patient Name: [name] [name]      Medical Record Number: [id number]  Date of Birth: 27-Feb-1950 Sex: Female         Room/Bed: 4M10C/4M10C-01 Payor Info: Payor: MEDICARE / Plan: MEDICARE PART A AND B / Product Type: *No Product type* /    Admitting Diagnosis: CVA  Admit Date/Time:  05/08/2017  5:49 PM Admission Comments: No comment available   Primary Diagnosis:  <principal problem not specified> Principal Problem: <principal problem not specified>  Patient Active Problem List   Diagnosis Date Noted  . Leukocytosis   . Acute blood loss anemia   . Hypoalbuminemia   . Hyponatremia   . Metastatic carcinoma ([place])   . Occipital infarction ([place]) 05/08/2017  . Ascites   . DNR (do not resuscitate)   . Palliative care encounter   . Stroke ([place]) 05/04/2017  . AKI (acute kidney injury) ([place]) 05/04/2017  . Normocytic anemia 05/04/2017  . Acute lower UTI 05/04/2017  . Cerebral infarction ([place])   . Acute deep vein thrombosis (DVT) of popliteal vein of right lower extremity (HCC)   . Acute saddle pulmonary embolism with acute cor pulmonale (HCC)   . Acute gouty arthritis 05/01/2017  . Impaired glucose tolerance 06/12/2010  . Preventative health care 06/12/2010  . FATIGUE 08/27/2008  . Hyperlipidemia 06/25/2008  . Schizophrenia ([place]) 06/25/2008  . Essential hypertension 06/25/2008    Expected Discharge Date: Expected Discharge Date: 05/13/17  Team Members Present: Physician leading conference: Dr. Delice Lesch Social Worker Present: Ovidio Kin, LCSW Nurse Present: Isla Pence, RN OT Present: Clyda Greener, OT SLP Present: Windell Moulding, SLP PPS Coordinator present : Daiva Nakayama, RN, CRRN     Current Status/Progress Goal Weekly Team Focus  Medical   Right hemiparesis and cognitive deficits secondary to right posterior parietal and left occipital infarcts consistent with systemic emboli   Improve mobility, safety, cognition, PE, hyponatremia  See above   Bowel/Bladder   continent B/B, Last BM 05/04/17  BM within next day; PRN sorbitol given this shift  Remain continent B&B; assess q shift and PRN   Swallow/Nutrition/ Hydration             ADL's     eval pending        Mobility   min guard gait and stairs, higher level balance impairments, decreased aerobic endurance  modI bed mobility, transfers, S gait and stairs  activity tolerance, dynamic balance   Communication             Safety/Cognition/ Behavioral Observations            Pain   Tramadol 50-100mg  Q6hrs pain; c/o pain to L chest area  <3  assess pain q shift and PRN; medicate as needed   Skin   Bruising to LLE  remain free from skin breakdown and infection free  assess q shift and PRN; encourage mobility      *See Care Plan and progress notes for long and short-term goals.     Barriers to Discharge  Current Status/Progress Possible Resolutions Date Resolved   Physician    Medical stability;Other (comments)  PE  See above  Therapies, follow labs, anticoagulation      Nursing                  PT  OT                  SLP                SW                Discharge Planning/Teaching Needs:    Home with husband who can assist with her care. He is here daily and will participate in her therapies with her.     Team Discussion:  Goals supervision-mod/i level. Having to I & O cath every 4 hr due to retention. Pain on left side form PE pain meds given for this. Working balance and slow to move. Nursing working on constipation. On lovenox unsure what will go home on. Sodium low MD watching. Speech not to pick up while here.  Revisions to Treatment Plan:  DC 3/24    Continued Need for Acute Rehabilitation Level of Care: The patient requires daily medical management by a physician with specialized training in physical medicine and rehabilitation for the following conditions: Daily  direction of a multidisciplinary physical rehabilitation program to ensure safe treatment while eliciting the highest outcome that is of practical value to the patient.: Yes Daily medical management of patient stability for increased activity during participation in an intensive rehabilitation regime.: Yes Daily analysis of laboratory values and/or radiology reports with any subsequent need for medication adjustment of medical intervention for : Neurological problems;Other  Raywood Wailes, Gardiner Rhyme 05/10/2017, 10:01 AM

## 2017-05-09 NOTE — Progress Notes (Signed)
PMR Admission Coordinator Pre-Admission Assessment  Patient: [name] [name] is an 67 y.o., female MRN: [id number] DOB: 10/16/1950 Height: 5\' 8"  (172.7 cm) Weight: 79.8 kg (175 lb 14.8 oz)                                                                                                                                                  Insurance Information HMO:     PPO:      PCP:      IPA:      80/20:      OTHER:  PRIMARY: Medicare A & B      Policy#: [id number]      Subscriber: Self CM Name:       Phone#:      Fax#:  Pre-Cert#: Eligible       Employer:  Benefits:  Phone #: Verified online      Name: [employer]  Eff. 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 A CT of the chest positive for a large  burden of what appeared to be both chronic thrombus and acute pulmonary embolism in the lungs bilaterally left greater than right as well as multiple small pulmonary nodules scattered throughout the lungs bilaterally measuring 2 -5 mm in size concerning for metastatic lesions.Also noted large volume of ascites in the abdomen likely malignant given the apparent omental nodularity and underwent paracentesis 05/06/2017 with a total of 3.8 L of yellow fluid removed.Vascular study lower extremity positive for DVT right lower extremity in the femoral vein,  posterior tibial veins. MRI demonstrated multiple additional subcentimeter foci of late subacute to chronic infarcts scattered throughout the supratentorial and infratentorial brain with few additional punctate foci and left frontal, right parietal and left thalamus. Patient was placed on intravenous heparin for pulmonary emboliand await plan for long-term anticoagulation. TCD bubble study negative for PFO.Patient has declined any further workup for metastatic carcinoma and consultation obtained with palliative care.Therapy evaluations have been completed and ongoing. MD has requested physical medicine rehab consult. Patient was admitted for a comprehensive rehabilitation program 05/08/17.  NIH Total: 2  Past Medical History      Past Medical History:  Diagnosis Date  . CTS (carpal tunnel syndrome)    bilateral  . FATIGUE 08/27/2008  . GLUCOSE INTOLERANCE 06/25/2008  . HYPERLIPIDEMIA 06/25/2008  . HYPERTENSION 06/25/2008  . Impaired glucose tolerance 06/12/2010  . SCHIZOPHRENIA 06/25/2008    Family History  family history includes Hypertension in her other; Stroke in her other.  Prior Rehab/Hospitalizations:  Has the patient had major surgery during 100 days prior to admission? No  Current Medications   Current Facility-Administered Medications:  .  acetaminophen (TYLENOL) tablet 650 mg, 650 mg, Oral, Q4H PRN, 650 mg at 05/07/17 1224  **OR** acetaminophen (TYLENOL) solution 650 mg, 650 mg, Per Tube, Q4H PRN **OR** acetaminophen (TYLENOL) suppository 650 mg, 650 mg, Rectal, Q4H PRN, Opyd, Timothy S, MD .  aspirin EC tablet 81 mg, 81 mg, Oral, Daily, Opyd, Ilene Qua, MD, 81 mg at 05/08/17 0940 .  benztropine (COGENTIN) tablet 2 mg, 2 mg, Oral, Daily, Opyd, Ilene Qua, MD, 2 mg at 05/08/17 0940 .  cefTRIAXone (ROCEPHIN) 1 g in sodium chloride 0.9 % 100 mL IVPB, 1 g, Intravenous, Q0600, Velvet Bathe, MD, Last Rate: 200 mL/hr at 05/08/17 0530, 1 g at 05/08/17 0530 .  escitalopram (LEXAPRO) tablet 5 mg, 5 mg, Oral, Daily, Opyd, Ilene Qua, MD, 5 mg at 05/08/17 0940 .  haloperidol (HALDOL) tablet 15 mg, 15 mg, Oral, Daily, Opyd, Timothy S, MD, 15 mg at 05/08/17 0940 .  heparin ADULT infusion 100 units/mL (25000 units/282mL sodium chloride 0.45%), 950 Units/hr, Intravenous, Continuous, Vega, Orlando, MD, Last Rate: 9.5 mL/hr at 05/08/17 0536, 950 Units/hr at 05/08/17 0536 .  hydrALAZINE (APRESOLINE) injection 5 mg, 5 mg, Intravenous, Q6H PRN, Velvet Bathe, MD .  pravastatin (PRAVACHOL) tablet 40 mg, 40 mg, Oral, q1800, Opyd, Ilene Qua, MD, 40 mg at 05/07/17 1729 .  senna-docusate (Senokot-S) tablet 1 tablet, 1 tablet, Oral, QHS PRN, Opyd, Ilene Qua, MD .  traMADol (ULTRAM) tablet 50-100 mg, 50-100 mg, Oral, Q6H PRN, Velvet Bathe, MD, 50 mg at 05/08/17 0940 .  vitamin B-12 (CYANOCOBALAMIN) tablet 1,000 mcg, 1,000 mcg, Oral, Daily, Rosalin Hawking, MD, 1,000 mcg at 05/08/17 0940  Patients Current Diet: Fall precautions Diet Heart Room service appropriate? Yes; Fluid consistency: Thin  Precautions / Restrictions Precautions Precautions: Fall Precaution Comments: watch HR Restrictions Weight Bearing Restrictions: No   Has the patient had 2 or more falls or a fall with injury in the past year?No  Prior Activity Level Limited Community (1-2x/wk): Prior to admission patient was fully independent, but did not like to go out frequently.   She and her spouse worked together on most things as a Therapist, occupational.    Development worker, international aid / Paramedic Devices/Equipment: None Home Equipment: None  Prior Device Use: Indicate devices/aids used by the patient prior to current illness, exacerbation or injury? None of the above  Prior Functional Level Prior Function Level of  Independence: Independent Comments: Drives. Not working. Wears glasses but needs a new prescription.  Self Care: Did the patient need help bathing, dressing, using the toilet or eating? Independent  Indoor Mobility: Did the patient need assistance with walking from room to room (with or without device)? Independent  Stairs: Did the patient need assistance with internal or external stairs (with or without device)? Independent  Functional Cognition: Did the patient need help planning regular tasks such as shopping or remembering to take medications? Independent  Current Functional Level Cognition  Arousal/Alertness: Awake/alert Overall Cognitive Status: Impaired/Different from baseline Current Attention Level: Selective Orientation Level: Oriented X4 Following Commands: Follows multi-step commands with increased time General Comments: Difficulty with dual tasking while walking- hard time counting down by 5s from 100 during gait resulting in decreased speed and imbalance.  Attention: Selective, Sustained Sustained Attention: Impaired Sustained Attention Impairment: Verbal basic, Functional basic Selective Attention: Impaired Selective Attention Impairment: Functional basic, Verbal basic Memory: Impaired Memory Impairment: Decreased recall of new information(delayed recall 0/5) Awareness: Impaired Awareness Impairment: Intellectual impairment Problem Solving: Impaired Problem Solving Impairment: Functional basic(names 2/3 ways to make $13) Executive Function: Sequencing, Decision Making, Initiating Sequencing: Impaired Sequencing  Impairment: Functional basic Decision Making: Impaired Decision Making Impairment: Functional basic Initiating: Impaired Initiating Impairment: Verbal basic Safety/Judgment: Impaired    Extremity Assessment (includes Sensation/Coordination)  Upper Extremity Assessment: LUE deficits/detail, RUE deficits/detail RUE Deficits / Details: Poor coorindation and slow moving. Able to perform finger-to nose test with significant amount of time. Decreased grasp strength (Lweaker than R). Poor isolation of movement. RUE Coordination: decreased fine motor, decreased gross motor LUE Deficits / Details: Poor coorindation and slow moving. Able to perform finger-to nose test with significant amount of time. Decreased grasp strength (Lweaker than R). Poor isolation of movement. LUE Coordination: decreased fine motor, decreased gross motor  Lower Extremity Assessment: Defer to PT evaluation RLE Deficits / Details: Grossly ~1/5 DF/PF, 3+/5 knee extension/flexion RLE Sensation: WNL LLE Deficits / Details: Grossly ~4/5 throughout.  LLE Sensation: WNL    ADLs  Overall ADL's : Needs assistance/impaired Eating/Feeding: Set up, Sitting Grooming: Oral care, Min guard, Standing Grooming Details (indicate cue type and reason): Pt with two LOB due to posterior lean and required Min A to correct balance. Pt demonstrating poor cognition and required significant amount of time to complete task. Pt demosntrating poor working memory as seen by forgetting whether to push or pull faucet to turn on/off. Pt repeating process several times with poor recalling of whether to push or pull.  Upper Body Bathing: Minimal assistance, Sitting Lower Body Bathing: Moderate assistance, Sit to/from stand Upper Body Dressing : Minimal assistance, Sitting Lower Body Dressing: Moderate assistance, Sit to/from stand Toilet Transfer: Minimal assistance, Ambulation, Regular Toilet Toilet Transfer Details (indicate cue type and reason): Pt  requiring Min A for balance. Noted posterior lean throughout session Toileting- Clothing Manipulation and Hygiene: Min guard, Sit to/from stand Functional mobility during ADLs: Minimal assistance, Cueing for safety General ADL Comments: ambulated to bathroom; stood at sink twice:  brushed teeth then washed hands after toileting.  Pt pushed IV pole; steadying assistance given.  Pt had difficulty controlling where water went and was unaware of this; did not clean sink. Pt did attend to L side without cues. She used bil hands to open containers but tends to position L awkwardly when not actively engaging it.      Mobility  Overal bed mobility: Needs Assistance Bed Mobility: Supine to Sit, Sit to Supine Supine to sit:  Modified independent (Device/Increase time), HOB elevated Sit to supine: Modified independent (Device/Increase time), HOB elevated General bed mobility comments: No assist needed.     Transfers  Overall transfer level: Needs assistance Equipment used: None Transfers: Sit to/from Stand Sit to Stand: Min guard Stand pivot transfers: Min assist General transfer comment: Min guard for safety. Very guarded with all movements. Stood from [employer], declined transferring to chair.     Ambulation / Gait / Stairs / Wheelchair Mobility  Ambulation/Gait Ambulation/Gait assistance: Museum/gallery curator (Feet): 120 Feet Assistive device: None Gait Pattern/deviations: Step-through pattern, Decreased stride length, Leaning posteriorly, Narrow base of support, Shuffle General Gait Details: Very slow, guarded gait with stiff posture, cues for arm swing bilaterally. Cues to increase cadence. Needed to stop multiple times during cognitive tasks. Min A at times due to posterior lean esp when dual tasking.  Gait velocity: decreased Gait velocity interpretation: Below normal speed for age/gender Stairs: Yes Stairs assistance: Min guard Stair Management: Alternating pattern, Two  rails Number of Stairs: 3(+ 2 steps x2 bouts) General stair comments: Cues for technique.     Posture / Balance Balance Overall balance assessment: Needs assistance Sitting-balance support: Feet supported, No upper extremity supported Sitting balance-Leahy Scale: Good Standing balance support: During functional activity Standing balance-Leahy Scale: Good Standing balance comment: Able to stand at sink and brush teeth with supervision for safety.  High level balance activites: Backward walking, Direction changes, Turns, Sudden stops, Head turns High Level Balance Comments: Able to perform above with deviations in gait requiring external support to prevent fall; LOB walking backwards. Veering to right/left with head turns.     Special needs/care consideration BiPAP/CPAP: No CPM: No Continuous Drip IV: Heparin  Dialysis: No        Life Vest: No Oxygen: No Special Bed: No Trach Size: No Wound Vac (area): No       Skin: WDL, bruise to left lower extremity afrom 1 fall just prior to admission  Bowel mgmt: None since admission  Bladder mgmt: Continent  Diabetic mgmt: No     Previous Home Environment Living Arrangements: Spouse/significant other  Lives With: Spouse Available Help at Discharge: Family, Available 24 hours/day Type of Home: House Home Layout: One level Home Access: Level entry Bathroom Shower/Tub: Chiropodist: Standard Home Care Services: No  Discharge Living Setting Plans for Discharge Living Setting: Patient's home, Lives with (comment)(Spouse, Arnold) Type of Home at Discharge: House Discharge Home Layout: One level Discharge Home Access: Level entry(with door threshold) Discharge Bathroom Shower/Tub: Tub/shower unit, Curtain Discharge Bathroom Toilet: Standard Discharge Bathroom Accessibility: No Does the patient have any problems obtaining your medications?: (report that at times meds have been expensive but notify  MD)  Social/Family/Support Systems Patient Roles: Parent, Spouse Contact Information: Spouse: Roselie Awkward [phone] Anticipated Caregiver: Spouse Anticipated Caregiver's Contact Information: see above Ability/Limitations of Caregiver: none, 2 local daughter to help PRN as well Caregiver Availability: 24/7 Discharge Plan Discussed with Primary Caregiver: Yes Is Caregiver In Agreement with Plan?: Yes Does Caregiver/Family have Issues with Lodging/Transportation while Pt is in Rehab?: No  Goals/Additional Needs Patient/Family Goal for Rehab: PT/OT/SLP: Mod I-Supervision  Expected length of stay: 8-11 days  Cultural Considerations: Darrick Meigs, attends [employer] and requests no therapy on Sundays  Dietary Needs: Heart healthy diet restrictions  Equipment Needs: TBD Pt/Family Agrees to Admission and willing to participate: Yes Program Orientation Provided & Reviewed with Pt/Caregiver Including Roles  & Responsibilities: Yes  Barriers to Discharge: Medical stability,  Other (comments)(Plan for IV heparin )  Decrease burden of Care through IP rehab admission: No  Possible need for SNF placement upon discharge: No  Patient Condition: This patient's condition remains as documented in the consult dated 05/07/17, in which the Rehabilitation Physician determined and documented that the patient's condition is appropriate for intensive rehabilitative care in an inpatient rehabilitation facility. Will admit to inpatient rehab today.  Preadmission Screen Completed By:  Gunnar Fusi, 05/08/2017 3:05 PM ______________________________________________________________________   Discussed status with Dr. Naaman Plummer on 05/08/17 at 1530 and received telephone approval for admission today.  Admission Coordinator:  Gunnar Fusi, time 1530/Date 05/08/17             Cosigned by: Meredith Staggers, MD at 05/08/2017 3:45 PM  Revision History

## 2017-05-09 NOTE — Progress Notes (Signed)
[facility] PHYSICAL MEDICINE & REHABILITATION     PROGRESS NOTE  Subjective/Complaints:  Pt seen sitting up in bed this AM. Family at bedside.  Pt states she slept well overnight.  She notes her vision is near baseline.    ROS: Denies CP, SOB, N/V/D  Objective: Vital Signs: Blood pressure 119/71, pulse (!) 105, temperature 98.2 F (36.8 C), temperature source Oral, resp. rate 17, height 5\' 10"  (1.778 m), weight 84.3 kg (185 lb 13.6 oz), SpO2 95 %. No results found. Recent Labs    05/08/17 0547 05/09/17 0510  WBC 12.8* 10.4  HGB 10.4* 9.8*  HCT 30.9* 30.0*  PLT 413* 468*   Recent Labs    05/08/17 2048 05/09/17 0510  NA 125* 126*  K 4.1 4.1  CL 94* 97*  GLUCOSE 107* 100*  BUN 6 6  CREATININE 1.02* 0.94  CALCIUM 8.3* 8.3*   CBG (last 3)  No results for input(s): GLUCAP in the last 72 hours.  Wt Readings from Last 3 Encounters:  05/08/17 84.3 kg (185 lb 13.6 oz)  05/04/17 79.8 kg (175 lb 14.8 oz)  05/01/17 77.1 kg (170 lb)    Physical Exam:  BP 119/71 (BP Location: Left Arm)   Pulse (!) 105   Temp 98.2 F (36.8 C) (Oral)   Resp 17   Ht 5\' 10"  (1.778 m)   Wt 84.3 kg (185 lb 13.6 oz)   SpO2 95%   BMI 26.67 kg/m  Constitutional: She appearswell-nourished.No distress.  HENT: Normocephalic. Atraumatic. Eyes:EOMare normal. No discharge.  Cardiovascular:+Tachycardia. Normal rhythm. No JVD. Respiratory:Effort normaland breath sounds normal.  WF:UXNAT sounds are normal. She exhibitsno distension.  Musculoskeletal: She exhibits noedema and no tenderness. Neurological:Alert Slow to initiate Follow simple commands.  Processing delays.  Motor: RUE/RLE 4+/5 prox to distal.  LUE/LLE 4+/5 (stronger than right) Skin. Warm and dry Psych: Flat but cooperative   Assessment/Plan: 1. Functional deficits secondary to right posterior parietal and left occipital infarcts consistent with systemic emboli which require 3+ hours per day of interdisciplinary therapy  in a comprehensive inpatient rehab setting. Physiatrist is providing close team supervision and 24 hour management of active medical problems listed below. Physiatrist and rehab team continue to assess barriers to discharge/monitor patient progress toward functional and medical goals.  Function:  Bathing Bathing position      Bathing parts      Bathing assist        Upper Body Dressing/Undressing Upper body dressing                    Upper body assist        Lower Body Dressing/Undressing Lower body dressing                                  Lower body assist        Toileting Toileting          Toileting assist     Transfers Chair/bed transfer             Locomotion Ambulation           Wheelchair          Cognition Comprehension    Expression    Social Interaction    Problem Solving    Memory      Medical Problem List and Plan: 1.Right hemiparesis and cognitive deficitssecondary to right posterior parietal and left occipital infarcts consistent with  systemic emboli Begin CIR    Notes reviewed, images reviewed, labs reviewed 2. DVT Prophylaxis/Anticoagulation/bilateral pulmonary emboli left greater than right and right lower extremity femoral vein, posterior tibial vein SUP:[id number] transitioned to therapeutic Lovenox 3. Pain Management:Tramadol as needed 4. Mood/ schizophrenia:Lexapro 5 mg daily, Haldol 15 mg daily, cogent and 2 mg daily 5. Neuropsych: This patientisnot fully capable of making decisions on herown behalf. 6. Skin/Wound Care:Routine skin checks 7. Fluids/Electrolytes/Nutrition:Routine I&O's  8.Metastatic carcinoma. Patient declines any further workup at this time. Palliative care consulted 9.Ascites. Status post paracentesis 05/06/2017 10. Hyperlipidemia. Pravachol 11. Hyponatremia   Na 126 on 3/20   Labs ordered for tomorrow 12. Hypoalbuminemia   Supplement initiated on 3/20 13.  ABLA   Hb 9.8 on 3/20   Cont to monitor  LOS (Days) 1 A FACE TO FACE EVALUATION WAS PERFORMED  Ankit Lorie Phenix 05/09/2017 11:15 AM

## 2017-05-09 NOTE — Care Management Note (Signed)
[facility] Individual Statement of Services  Patient Name:  [name] [name]  Date:  05/09/2017  Welcome to the [facility].  Our goal is to provide you with an individualized program based on your diagnosis and situation, designed to meet your specific needs.  With this comprehensive rehabilitation program, you will be expected to participate in at least 3 hours of rehabilitation therapies Monday-Friday, with modified therapy programming on the weekends.  Your rehabilitation program will include the following services:  Physical Therapy (PT), Occupational Therapy (OT), Speech Therapy (ST), 24 hour per day rehabilitation nursing, Case Management (Social Worker), Rehabilitation Medicine, Nutrition Services and Pharmacy Services  Weekly team conferences will be held on Wednesday to discuss your progress.  Your Social Worker will talk with you frequently to get your input and to update you on team discussions.  Team conferences with you and your family in attendance may also be held.  Expected length of stay: 4-6 days  Overall anticipated outcome: mod/i-supervision level  Depending on your progress and recovery, your program may change. Your Social Worker will coordinate services and will keep you informed of any changes. Your Social Worker's name and contact numbers are listed  below.  The following services may also be recommended but are not provided by the [facility]:   [place] will be made to provide these services after discharge if needed.  Arrangements include referral to agencies that provide these services.  Your insurance has been verified to be:  Medicare A & B Your primary doctor is:  Cathlean Cower  Pertinent information will be shared with your doctor and your insurance company.  Social Worker:  Ovidio Kin, [place] or ([phone]  Information discussed with and copy given to patient by: Elease Hashimoto, 05/09/2017, 1:41 PM

## 2017-05-09 NOTE — Evaluation (Signed)
Speech Language Pathology Assessment and Plan  Patient Details  Name: DEMARIE UHLIG MRN: [id number] Date of Birth: 08-Jun-1950   Today's Date: 05/09/2017 SLP Individual Time: 1300-1350 SLP Individual Time Calculation (min): 50 min   Problem List:  Patient Active Problem List   Diagnosis Date Noted  . Acute blood loss anemia   . Hypoalbuminemia   . Hyponatremia   . Metastatic carcinoma ([place])   . Occipital infarction ([place]) 05/08/2017  . Ascites   . DNR (do not resuscitate)   . Palliative care encounter   . Stroke ([place]) 05/04/2017  . AKI (acute kidney injury) ([place]) 05/04/2017  . Normocytic anemia 05/04/2017  . Acute lower UTI 05/04/2017  . Cerebral infarction ([place])   . Acute deep vein thrombosis (DVT) of popliteal vein of right lower extremity (HCC)   . Acute saddle pulmonary embolism with acute cor pulmonale (HCC)   . Acute gouty arthritis 05/01/2017  . Impaired glucose tolerance 06/12/2010  . Preventative health care 06/12/2010  . FATIGUE 08/27/2008  . Hyperlipidemia 06/25/2008  . Schizophrenia ([place]) 06/25/2008  . Essential hypertension 06/25/2008   Past Medical History:  Past Medical History:  Diagnosis Date  . CTS (carpal tunnel syndrome)    bilateral  . FATIGUE 08/27/2008  . GLUCOSE INTOLERANCE 06/25/2008  . HYPERLIPIDEMIA 06/25/2008  . HYPERTENSION 06/25/2008  . Impaired glucose tolerance 06/12/2010  . SCHIZOPHRENIA 06/25/2008   Past Surgical History:  Past Surgical History:  Procedure Laterality Date  . DILATION AND CURETTAGE OF UTERUS  1999   hx of after miscarriage  . s/p cervical procedure  1995   LEEP    Assessment / Plan / Recommendation Clinical Impression   Fajr Fife Bowenis a 67 y.o.right-handedfemalewith history of hypertension, schizophrenia. 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 A CT of the chest positive for a large burden of what appeared to be both chronic thrombus and acute pulmonary embolism in the lungs bilaterally left greater than right as well as multiple small pulmonary nodules scattered throughout the lungs bilaterally measuring 2 -5 mm in size concerning for metastatic lesions. Also noted large volume of ascites in the abdomen likely malignant given the apparent omental nodularity and underwent paracentesis 05/06/2017 with a total of 3.8 L of yellow fluid removed. Vascular study lower extremity positive for DVT right lower extremity in the femoral vein, posterior tibial veins. MRI demonstrated multiple additional subcentimeter foci of late subacute to chronic infarcts scattered throughout the supratentorial and infratentorial brain with few additional punctate foci and left frontal, right parietal and left thalamus. Patient was placed on intravenous heparin for pulmonary emboli and await plan for long-term anticoagulation. TCD bubble study negative for PFO. Patient has declined any further workup for metastatic carcinoma and consultation obtained with palliative care.Therapy evaluations have been completed and ongoing. MD has requested physical medicine rehab consult. Patient was admitted for a comprehensive rehabilitation program.  SLP evaluation was completed on 05/09/2017 with the following results:  Pt presents with grossly intact cognitive-linguistic function when baseline is taken into account.  Pt reports that she was relatively inactive prior to admission and spent most of her time watching TV and listening to the radio.  She did manage her own medications and finances with the  help of her husband.  Husband was present throughout evaluation and reported that he would be  willing and able to take over meds and finances at home.  Both pt and husband report that pt is near her baseline for cognition.  As a result, no further ST needs are indicated at this time. SLP completed education regarding following up with primary care physician for ST consult if pt does not fully return to baseline in her familiar home environment.    Skilled Therapeutic Interventions          Cognitive-linguistic evaluation completed with results and recommendations reviewed with patient and family.     SLP Assessment  Patient does not need any further Speech Lanaguage Pathology Services    Recommendations  Follow up Recommendations: None Equipment Recommended: None recommended by SLP           Pain Pain Assessment Pain Assessment: No/denies pain  Prior Functioning Cognitive/Linguistic Baseline: Within functional limits Type of Home: House  Lives With: Spouse Available Help at Discharge: Family;Available 24 hours/day  Function:  Eating Eating                 Cognition Comprehension Comprehension assist level: Follows basic conversation/direction with extra time/assistive device  Expression   Expression assist level: Expresses basic needs/ideas: With extra time/assistive device  Social Interaction Social Interaction assist level: Interacts appropriately 90% of the time - Needs monitoring or encouragement for participation or interaction.  Problem Solving Problem solving assist level: Solves basic 75 - 89% of the time/requires cueing 10 - 24% of the time  Memory Memory assist level: Recognizes or recalls 90% of the time/requires cueing < 10% of the time     Refer to Care Plan for Long Term Goals  Recommendations for other services: None   Discharge Criteria: Patient will be discharged from SLP if patient refuses treatment 3 consecutive times without medical reason, if treatment goals not met, if there is a change in medical status, if patient makes no progress  towards goals or if patient is discharged from hospital.  The above assessment, treatment plan, treatment alternatives and goals were discussed and mutually agreed upon: by patient and by family  Kobi Mario, Selinda Orion 05/09/2017, 2:13 PM

## 2017-05-09 NOTE — Progress Notes (Signed)
Patient information reviewed and entered into eRehab system by Charda Janis, RN, CRRN, PPS Coordinator.  Information including medical coding and functional independence measure will be reviewed and updated through discharge.     Per nursing patient was given "Data Collection Information Summary for Patients in Inpatient Rehabilitation Facilities with attached "Privacy Act Statement-Health Care Records" upon admission.  

## 2017-05-09 NOTE — Progress Notes (Signed)
Patient has had multiple voids through this shift with little urine output. Patient was bladder scanned following last void, PVR 368ml. Marlowe Shores PA contacted via telephone and new order received to I&O cath patient for PVR <32ml and UA C&S. However, patient did have urinalysis sent on 05/04/17 which PA stated that he would look at upon arriving to hospital this am. Patient was cathed with urine amount of 446ml, clear, yellow urine. Patient denies pain/discomfort. Will continue to monitor.

## 2017-05-09 NOTE — Evaluation (Signed)
Occupational Therapy Assessment and Plan  Patient Details  Name: [name] [name] MRN: [id number] Date of Birth: 1950-09-26  OT Diagnosis: abnormal posture, hemiplegia affecting dominant side and muscle weakness (generalized) Rehab Potential: Rehab Potential (ACUTE ONLY): Excellent ELOS: 4-6 days   Today's Date: 05/09/2017 OT Individual Time: 6967-8938 OT Individual Time Calculation (min): 56 min     Problem List:  Patient Active Problem List   Diagnosis Date Noted  . Acute blood loss anemia   . Hypoalbuminemia   . Hyponatremia   . Metastatic carcinoma ([place])   . Occipital infarction ([place]) 05/08/2017  . Ascites   . DNR (do not resuscitate)   . Palliative care encounter   . Stroke ([place]) 05/04/2017  . AKI (acute kidney injury) ([place]) 05/04/2017  . Normocytic anemia 05/04/2017  . Acute lower UTI 05/04/2017  . Cerebral infarction ([place])   . Acute deep vein thrombosis (DVT) of popliteal vein of right lower extremity (HCC)   . Acute saddle pulmonary embolism with acute cor pulmonale (HCC)   . Acute gouty arthritis 05/01/2017  . Impaired glucose tolerance 06/12/2010  . Preventative health care 06/12/2010  . FATIGUE 08/27/2008  . Hyperlipidemia 06/25/2008  . Schizophrenia ([place]) 06/25/2008  . Essential hypertension 06/25/2008    Past Medical History:  Past Medical History:  Diagnosis Date  . CTS (carpal tunnel syndrome)    bilateral  . FATIGUE 08/27/2008  . GLUCOSE INTOLERANCE 06/25/2008  . HYPERLIPIDEMIA 06/25/2008  . HYPERTENSION 06/25/2008  . Impaired glucose tolerance 06/12/2010  . SCHIZOPHRENIA 06/25/2008   Past Surgical History:  Past Surgical History:  Procedure Laterality Date  . DILATION AND CURETTAGE OF UTERUS  1999   hx of after miscarriage  . s/p cervical procedure  1995   LEEP    Assessment & Plan Clinical Impression: Patient is a 67 y.o. 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 A CT of the chest positive for a large burden of what appeared to be both chronic thrombus and acute pulmonary embolism in the lungs bilaterally left greater than right as well as multiple small pulmonary nodules scattered throughout the lungs bilaterally measuring 2 -5 mm in size concerning for metastatic lesions.Also noted large volume of ascites in the abdomen likely malignant given the apparent omental nodularity and underwent paracentesis 05/06/2017 with a total of 3.8 L of yellow fluid removed.Vascular study lower extremity positive for DVT right lower extremity in the femoral vein, posterior tibial veins. MRI demonstrated multiple additional subcentimeter foci of late subacute to chronic infarcts scattered throughout the supratentorial and infratentorial brain with few additional punctate foci and left frontal, right parietal and left thalamus.  Patient transferred to CIR on 05/08/2017 .    Patient currently requires min with basic self-care skills secondary to muscle weakness, decreased coordination, decreased visual acuity and decreased standing balance and decreased balance strategies.  Prior to hospitalization, patient could complete ADLs with independent .  Patient will benefit from skilled intervention to decrease level of assist with basic self-care skills and increase independence with basic self-care skills prior to discharge home with care partner.  Anticipate patient will require intermittent supervision and no further OT follow recommended.  OT - End  of Session Activity Tolerance: Tolerates 30+ min activity without fatigue Endurance Deficit: Yes Endurance  Deficit Description: HR elevates significantly with short duration mobility activities OT Assessment Rehab Potential (ACUTE ONLY): Excellent OT Patient demonstrates impairments in the following area(s): Balance;Endurance;Motor OT Basic ADL's Functional Problem(s): Grooming;Bathing;Dressing;Toileting OT Advanced ADL's Functional Problem(s): Laundry OT Transfers Functional Problem(s): Toilet;Tub/Shower OT Additional Impairment(s): Fuctional Use of Upper Extremity OT Plan OT Intensity: Minimum of 1-2 x/day, 45 to 90 minutes OT Frequency: 5 out of 7 days OT Duration/Estimated Length of Stay: 4-6 days OT Treatment/Interventions: Balance/vestibular training;Discharge planning;Self Care/advanced ADL retraining;Therapeutic Activities;UE/LE Coordination activities;Therapeutic Exercise;Patient/family education;Functional mobility training;Cognitive remediation/compensation;Community reintegration;DME/adaptive equipment instruction;Neuromuscular re-education;UE/LE Strength taining/ROM;Visual/perceptual remediation/compensation OT Self Feeding Anticipated Outcome(s): independent OT Basic Self-Care Anticipated Outcome(s): modified independent OT Toileting Anticipated Outcome(s): modified independent OT Bathroom Transfers Anticipated Outcome(s): modified independent OT Recommendation Patient destination: Home Follow Up Recommendations: None Equipment Recommended: To be determined   Skilled Therapeutic Intervention Pt began working on selfcare retraining sit to stand at the sink, per her choice.  She was able to complete toilet transfer and toileting with min guard assist, prior to gathering towel and washcloth for bathing.  She was able to stand at the sink with close supervision for washing her UB, face, and peri area.  She sat in the wheelchair for washing her lower legs and feet.  She had a shirt and underpants but not pants to wear, so gown was donned in addition.  Increased difficulty noted attempting  to stand and donn underpants, so she realized this and sat down to complete the task.  She also stood at the sink to complete oral hygiene as well.  Movements in general with bathing and dressing were sightly slower.  Functional mobility was completed down and back to the tub room, in which she demonstrated no LOB but did exhibit small step length and slow gait pattern.  Noted increased HR during selfcare tasks, up to 130 BPM.  HR elevated to 140 after mobility as well.  Finished session with pt in bed and spouse present.  Call button and phone in reach.    OT Evaluation Precautions/Restrictions  Precautions Precautions: Fall Precaution Comments: watch HR- elevates quickly with mobility activities Restrictions Weight Bearing Restrictions: No  Pain Pain Assessment Pain Assessment: No/denies pain Home Living/Prior Functioning Home Living Family/patient expects to be discharged to:: Private residence Living Arrangements: Spouse/significant other Available Help at Discharge: Family, Available 24 hours/day Type of Home: House Home Access: Level entry Home Layout: One level Bathroom Shower/Tub: Chiropodist: Standard  Lives With: Spouse IADL History Homemaking Responsibilities: Yes Meal Prep Responsibility: Secondary Laundry Responsibility: Primary Cleaning Responsibility: Secondary Bill Paying/Finance Responsibility: Primary Current License: Yes Occupation: Unemployed Prior Function Level of Independence: Independent with basic ADLs  Able to Take Stairs?: Yes Driving: Yes Comments: Drives. Not working. Wears glasses but needs a new prescription. ADL  See Function Section of chart for details  Vision Baseline Vision/History: Wears glasses Wears Glasses: Reading only Patient Visual Report: Blurring of vision Vision Assessment?: No apparent visual deficits Eye Alignment: Within Functional Limits Ocular Range of Motion: Within Functional Limits Alignment/Gaze  Preference: Within Defined Limits Tracking/Visual Pursuits: Able to track stimulus in all quads without difficulty Convergence: Within functional limits Visual Fields: No apparent deficits Additional Comments: Pt with no glaring visual deficits with gross testing.  She was able to correctly identify fingers in far visual fields on both sides and could track with both eyes in all directions.  No reported diplopia but  did report visual acuity issues which were present prior to this admission.  Will continue to assess further in functional context.   Perception  Perception: Within Functional Limits Praxis Praxis: Intact Cognition Overall Cognitive Status: Within Functional Limits for tasks assessed Arousal/Alertness: Awake/alert Orientation Level: Person;Place;Situation Person: Oriented Situation: Oriented Year: 2019 Month: March Day of Week: Correct Memory: Appears intact Immediate Memory Recall: Sock;Blue;Bed Memory Recall: Blue;Bed;Sock Memory Recall Sock: Without Cue Memory Recall Blue: Without Cue Attention: Sustained;Selective Sustained Attention: Appears intact Selective Attention: Appears intact Sequencing: Appears intact Comments: Pt able to sequence through selfcare tasks without difficulty and maintain selective attention as well.  No glaring deficits noted Sensation Sensation Light Touch: Appears Intact Stereognosis: Appears Intact Hot/Cold: Appears Intact Proprioception: Appears Intact Coordination Gross Motor Movements are Fluid and Coordinated: Yes Fine Motor Movements are Fluid and Coordinated: (slower speed on the right and left with finger to nose but may be more related to schizophrenic diagnosis than CVA) Motor  Motor Motor - Skilled Clinical Observations: mild R hemiparesis present Mobility    See Function Section of chart for details  Trunk/Postural Assessment  Cervical Assessment Cervical Assessment: Within Functional Limits Thoracic  Assessment Thoracic Assessment: Within Functional Limits Lumbar Assessment Lumbar Assessment: Exceptions to WFL(Pt stands with excessive lumbar extension at times and increased posterior lean) Postural Control Postural Control: Deficits on evaluation Protective Responses: Pt with slight dynamic balance deficits with delayed reaction when stepping over the edge of the tub.   Balance Balance Balance Assessed: Yes Static Sitting Balance Static Sitting - Balance Support: Feet supported;No upper extremity supported Static Sitting - Level of Assistance: 7: Independent Dynamic Sitting Balance Dynamic Sitting - Balance Support: No upper extremity supported;During functional activity Dynamic Sitting - Level of Assistance: 6: Modified independent (Device/Increase time) Static Standing Balance Static Standing - Balance Support: During functional activity;No upper extremity supported Static Standing - Level of Assistance: 5: Stand by assistance Dynamic Standing Balance Dynamic Standing - Balance Support: During functional activity;No upper extremity supported Dynamic Standing - Level of Assistance: 5: Stand by assistance Dynamic Standing - Balance Activities: Lateral lean/weight shifting;Reaching for objects Extremity/Trunk Assessment RUE Assessment RUE Assessment: Exceptions to [facility] for all joints, overall shoulder strength 3+/5, elbow and grip strength 4/5, FM coordination WFLS) LUE Assessment LUE Assessment: Within Functional Limits   See Function Navigator for Current Functional Status.   Refer to Care Plan for Long Term Goals  Recommendations for other services: None    Discharge Criteria: Patient will be discharged from OT if patient refuses treatment 3 consecutive times without medical reason, if treatment goals not met, if there is a change in medical status, if patient makes no progress towards goals or if patient is discharged from hospital.  The above assessment,  treatment plan, treatment alternatives and goals were discussed and mutually agreed upon: by patient and by family  Wallie Lagrand OTR/L 05/09/2017, 4:34 PM

## 2017-05-09 NOTE — Progress Notes (Signed)
Social Work  Social Work Assessment and Plan  Patient Details  Name: [name] [name] MRN: [id number] Date of Birth: 04/24/1950  Today's Date: 05/09/2017  Problem List:  Patient Active Problem List   Diagnosis Date Noted  . 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 Ascites   . DNR (do not resuscitate)   . Palliative care encounter   . Stroke ([place]) 05/04/2017  . AKI (acute kidney injury) ([place]) 05/04/2017  . Normocytic anemia 05/04/2017  . Acute lower UTI 05/04/2017  . Cerebral infarction ([place])   . Acute deep vein thrombosis (DVT) of popliteal vein of right lower extremity (HCC)   . Acute saddle pulmonary embolism with acute cor pulmonale (HCC)   . Acute gouty arthritis 05/01/2017  . Impaired glucose tolerance 06/12/2010  . Preventative health care 06/12/2010  . FATIGUE 08/27/2008  . Hyperlipidemia 06/25/2008  . Schizophrenia ([place]) 06/25/2008  . Essential hypertension 06/25/2008   Past Medical History:  Past Medical History:  Diagnosis Date  . CTS (carpal tunnel syndrome)    bilateral  . FATIGUE 08/27/2008  . GLUCOSE INTOLERANCE 06/25/2008  . HYPERLIPIDEMIA 06/25/2008  . HYPERTENSION 06/25/2008  . Impaired glucose tolerance 06/12/2010  . SCHIZOPHRENIA 06/25/2008   Past Surgical History:  Past Surgical History:  Procedure Laterality Date  . DILATION AND CURETTAGE OF UTERUS  1999   hx of after miscarriage  . s/p cervical procedure  1995   LEEP   Social History:  reports that she has quit smoking. She quit smokeless tobacco use about 41 years ago. She reports that she does not drink alcohol or use drugs.  Family / Support Systems Marital Status: Married How Long?: 21 years Patient Roles: Spouse, Parent Spouse/Significant Other: Roselie Awkward Yvone Neu)  [phone] Children: Simone Hall-daughter [phone] Other Supports: Another daughter who is local and involved Anticipated Caregiver: Husband Ability/Limitations of Caregiver: Husband is disabled but able to assist pt-daughter's  local and involved Caregiver Availability: 24/7 Family Dynamics: Close knit family who will provide the care pt will need. Husband plans to be here daily to see her progress and provide support. They have friends and church members who are supportive also.  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 Husband will always be present if support needed.   Abuse/Neglect Abuse/Neglect Assessment Can Be Completed: Yes Physical Abuse: Denies Verbal Abuse: Denies Sexual Abuse: Denies Exploitation of patient/patient's resources: Denies Self-Neglect: Denies  Emotional Status Pt's affect, behavior adn adjustment status: Pt has always been independent and able to take care of herself, she cared for husbannd when he had cancer and went thorugh treatments. She saw all of it and that is the reason she doesn't want any of it. She is still processing all of it. Recent Psychosocial Issues: other health issues were managed before this Pyschiatric History: History of schizophrenia takes medications for this and it keeps it stable. See's someone every other month which will continue once discharged. Would benefit from seeing neuro-psych but will only be here a short time due to her high level Substance Abuse History: No issues  Patient / Family Perceptions, Expectations & Goals Pt/Family understanding of illness & functional limitations: Pt and husband have spoken with the MD's and feel they have a good understanding at this time, aware will need to meet with an oncologist to  see what options she has for treatment. She only wants to do so much, where as husband wants her to be here as long as possible for him Premorbid pt/family  roles/activities: Mom, retiree, friend, church member, etc Anticipated changes in roles/activities/participation: resume Pt/family expectations/goals: Pt states: " I want to get home and then decide what I want to do, all if too much right now."  Husband states: " I will support and be there for her every step of the way, she was for me."  [employer]: Other (Comment)(MH services) Premorbid Home Care/DME Agencies: None Transportation available at discharge: Husband Resource referrals recommended: Neuropsychology, Support group (specify)  Discharge Planning Living Arrangements: Spouse/significant other Support Systems: Spouse/significant other, Children, Friends/neighbors, Church/faith community Type of Residence: Private residence Insurance Resources: Chartered certified accountant Resources: [employer] Financial Screen Referred: No Living Expenses: Education officer, community Management: Spouse Does the patient have any problems obtaining your medications?: No Home Management: Both of them do together Patient/Family Preliminary Plans: Return home with husband who is able to assist and will be here daily to see her in therapies and provide support. Pt cared for husband when he had cancer and he will do the same for her. It is still all new to them and they are still processing it all. Pt wants to get home and then decide what she plans on doing. Social Work Anticipated Follow Up Needs: HH/OP, Support Group  Clinical Impression Pleasant female who is doing well and hopes to be here for a short time and then home. Her husband is very supportive and involved and will care for her at discharge. Her daughter's are involved also and will be checking up on them at home. Await evaluations and work on discharge needs. Pt open regarding what has been told her diagnoses are and wants to think about what she wants to do and when. Aware will be following up with oncologist as an OP.  Elease Hashimoto 05/09/2017, 10:17 AM

## 2017-05-10 ENCOUNTER — Inpatient Hospital Stay (HOSPITAL_COMMUNITY): Payer: Medicare Other | Admitting: Physical Therapy

## 2017-05-10 ENCOUNTER — Inpatient Hospital Stay (HOSPITAL_COMMUNITY): Payer: Medicare Other

## 2017-05-10 ENCOUNTER — Inpatient Hospital Stay (HOSPITAL_COMMUNITY): Payer: Medicare Other | Admitting: Speech Pathology

## 2017-05-10 ENCOUNTER — Inpatient Hospital Stay (HOSPITAL_COMMUNITY): Payer: Medicare Other | Admitting: Occupational Therapy

## 2017-05-10 DIAGNOSIS — R180 Malignant ascites: Secondary | ICD-10-CM

## 2017-05-10 DIAGNOSIS — D72829 Elevated white blood cell count, unspecified: Secondary | ICD-10-CM

## 2017-05-10 LAB — CBC
HCT: 30.9 % — ABNORMAL LOW (ref 36.0–46.0)
HEMOGLOBIN: 10.1 g/dL — AB (ref 12.0–15.0)
MCH: 25.7 pg — AB (ref 26.0–34.0)
MCHC: 32.7 g/dL (ref 30.0–36.0)
MCV: 78.6 fL (ref 78.0–100.0)
PLATELETS: 604 10*3/uL — AB (ref 150–400)
RBC: 3.93 MIL/uL (ref 3.87–5.11)
RDW: 16.2 % — ABNORMAL HIGH (ref 11.5–15.5)
WBC: 11 10*3/uL — AB (ref 4.0–10.5)

## 2017-05-10 LAB — BASIC METABOLIC PANEL
ANION GAP: 8 (ref 5–15)
BUN: 7 mg/dL (ref 6–20)
CHLORIDE: 95 mmol/L — AB (ref 101–111)
CO2: 21 mmol/L — ABNORMAL LOW (ref 22–32)
Calcium: 8.6 mg/dL — ABNORMAL LOW (ref 8.9–10.3)
Creatinine, Ser: 0.95 mg/dL (ref 0.44–1.00)
GFR calc Af Amer: >60 mL/min (ref >60)
Glucose, Bld: 132 mg/dL — ABNORMAL HIGH (ref 65–99)
POTASSIUM: 3.7 mmol/L (ref 3.5–5.1)
SODIUM: 124 mmol/L — AB (ref 135–145)

## 2017-05-10 MED ORDER — RIVAROXABAN 20 MG PO TABS: 20.0000 mg | ORAL_TABLET | Freq: Every day | ORAL | Status: DC

## 2017-05-10 MED ORDER — RIVAROXABAN 15 MG PO TABS
15.0000 mg | ORAL_TABLET | Freq: Two times a day (BID) | ORAL | Status: DC
  Administered 2017-05-10 – 2017-05-13 (×6): 15 mg via ORAL
  Filled 2017-05-10 (×6): qty 1

## 2017-05-10 NOTE — Progress Notes (Signed)
ANTICOAGULATION CONSULT NOTE - Follow Up Consult  Pharmacy Consult for Heparin to Xarelto Indication: Acute DVT/PE and new CVA d/t hypercoagulable state  Allergies  Allergen Reactions  . Nabumetone Rash    Patient Measurements: Height: 5\' 10"  (177.8 cm) Weight: 185 lb 13.6 oz (84.3 kg) IBW/kg (Calculated) : 68.5 Heparin Dosing Weight: 80 kg  Vital Signs: Temp: 99 F (37.2 C) (03/21 1401) Temp Source: Oral (03/21 1401) BP: 127/88 (03/21 1401) Pulse Rate: 110 (03/21 1401)  Labs: Recent Labs    05/08/17 0547 05/08/17 2048 05/09/17 0510 05/10/17 0914  HGB 10.4*  --  9.8* 10.1*  HCT 30.9*  --  30.0* 30.9*  PLT 413*  --  468* 604*  HEPARINUNFRC 0.35  --   --   --   CREATININE  --  1.02* 0.94 0.95    Estimated Creatinine Clearance: 70.6 mL/min (by C-G formula based on SCr of 0.95 mg/dL).   Assessment: 73 YOF with new DVT/PE and new CVA - pattern consistent with systemic emboli, concerning for hypercoagulable state d/t advanced malignancy.  IV Heparin switched to full dose lovenox on 3/19, now will switched to xarelto for anticoagulation. Hgb 10.1, pltc 604K, scr 0.95, stable, est. Crcl ~ 70 ml/min. Last lovenox dose given at 0830  Plan: Xarelto 15 mg po BID x 21 days though 4/10 Then start xarelto 20 mg daily on 4/11 Monitor CBC  D/C lovenox  Maryanna Shape, PharmD, BCPS  Clinical Pharmacist  Pager: [phone]    05/10/2017,2:21 PM

## 2017-05-10 NOTE — Progress Notes (Signed)
Occupational Therapy Session Note  Patient Details  Name: [name] [name] MRN: [id number] Date of Birth: 11-01-1950  Today's Date: 05/10/2017 OT Individual Time: 1100-1158 OT Individual Time Calculation (min): 58 min    Short Term Goals: Week 1:  OT Short Term Goal 1 (Week 1): STGs equal to LTGs set at modified independent level.   Skilled Therapeutic Interventions/Progress Updates:    Pt began session with functional mobility to the therapy gym with close supervision and slower speed than normal.  Once in the gym had pt complete nine hole peg test in sitting.  She was able to perform this in 40 seconds with the right hand and 48 seconds with the left.  Also looked at visual tracking, and saccadic movements.  She demonstrates some difficulty with saccadic movement when scanning from lower to upper in a diagonal pattern.  Had pt perform several reps of this to target.  Next had pt ambulated to the day room for use of the Nustep.  She completed 2 sets of 5 mins each with use of all extremities.  First set was completed on level 5 resistance with the second set on level 6.  She maintained 40-44 steps per minute with each set.  Next had pt use the Dynavision in standing.  She averaged a slower than normal 2.7 seconds reaction time on the first set, but improved with the next 2 sets.  Her second and third sets were with reaction times of 2.24 and 2.14 respectively.  Noted all movements including mobility are slower, likely due to her prior medical issues rather than CVA.  Finished session with ambulation back to the room.  Therapist encouraging larger and quicker steps, but pt still with slower than normal movement, no arm swing, and head fixed at midline.  Pt left in bed with spouse present.  Discussed her progress in the session before leaving the room.    Therapy Documentation Precautions:  Precautions Precautions: Fall Precaution Comments: watch HR- elevates quickly with mobility  activities Restrictions Weight Bearing Restrictions: No  Pain: Pain Assessment Pain Scale: 0-10 Pain Score: 0-No pain ADL: See Function Navigator for Current Functional Status.   Therapy/Group: Individual Therapy  Anaiyah Anglemyer OTR/L 05/10/2017, 12:52 PM

## 2017-05-10 NOTE — Progress Notes (Signed)
Occupational Therapy Session Note  Patient Details  Name: [name] [name] MRN: [id number] Date of Birth: 1950-04-22  Today's Date: 05/10/2017 OT Individual Time: 0930-1030 OT Individual Time Calculation (min): 60 min    Short Term Goals: Week 1:  OT Short Term Goal 1 (Week 1): STGs equal to LTGs set at modified independent level.   Skilled Therapeutic Interventions/Progress Updates:    1:1 Self care retraining at sink level (pt's choice). Pt did report she plans to shower at home- encouraged her to perform this tomorrow. Pt able to perform bathing and dressing with setup today at the sink with VC to remember to lock the breaks for safety before sit to stands. Pt ambulated from her room to the gym and then required a long rest break.  Pt engaged in standing balance tasks: stepping up and down on a 3 inch block for 1 min  And then bouncing 1 lb ball on rebounder.  Pt required long rest breaks after every activity . Seated exercises with 3 lb weights on ankles for LEs in seated and sidelying.   Therapy Documentation Precautions:  Precautions Precautions: Fall Precaution Comments: watch HR- elevates quickly with mobility activities Restrictions Weight Bearing Restrictions: No Pain: Pain Assessment Pain Scale: 0-10 Pain Score: 0-No pain  See Function Navigator for Current Functional Status.   Therapy/Group: Individual Therapy  Willeen Cass [facility] 05/10/2017, 1:48 PM

## 2017-05-10 NOTE — Progress Notes (Addendum)
Physical Therapy Session Note  Patient Details  Name: [name] [name] MRN: [id number] Date of Birth: January 08, 1951  Today's Date: 05/10/2017 PT Individual Time: 1300-1345 PT Individual Time Calculation (min): 45 min   Short Term Goals: Week 1:  PT Short Term Goal 1 (Week 1): =LTG due to estimated LOS  Skilled Therapeutic Interventions/Progress Updates:    Session focused on NMR to address higher level balance and balance strategies using Biodex (limits of stability program with BUE support progressing to no or 1 UE support and initially static progressing to level 9 and 11) with facilitation for weightshift. Gait training to/from therapy with close supervision to [place] without AD with cues for increased step length and arm swing for more natural gait pattern as pt maintains RUE flexed and body very stiff. Higher level dynamic gait, coordination and balance activity to kick yoga block during gait requiring up to mod assist for LOB and decreased ability to dual task. Returned to bed end of session to rest before next session.   HR monitored throughout session and rest breaks given as needed to decrease. Ranging 115-126 bpm with activity.  Addendum: 15 min of session for make-up session   Therapy Documentation Precautions:  Precautions Precautions: Fall Precaution Comments: watch HR- elevates quickly with mobility activities Restrictions Weight Bearing Restrictions: No   Pain: Pain Assessment Pain Scale: 0-10 Pain Score: 0-No pain   See Function Navigator for Current Functional Status.   Therapy/Group: Individual Therapy  Canary Brim Ivory Broad, PT, DPT  05/10/2017, 1:52 PM

## 2017-05-10 NOTE — Progress Notes (Signed)
Social Work   Reinhold Rickey, Eliezer Champagne  Social Worker  Physical Medicine and Rehabilitation  Patient Care Conference  Signed  Date of Service:  05/09/2017  3:24 PM          Signed          [] Hide copied text  [] Hover for details   Inpatient RehabilitationTeam Conference and Plan of Care Update Date: 05/09/2017   Time: 2:15 PM      Patient Name: [name] [name]      Medical Record Number: [id number]  Date of Birth: 1951-02-14 Sex: Female         Room/Bed: 4M10C/4M10C-01 Payor Info: Payor: MEDICARE / Plan: MEDICARE PART A AND B / Product Type: *No Product type* /     Admitting Diagnosis: CVA  Admit Date/Time:  05/08/2017  5:49 PM Admission Comments: No comment available    Primary Diagnosis:  <principal problem not specified> Principal Problem: <principal problem not specified>       Patient Active Problem List    Diagnosis Date Noted  . Leukocytosis    . Acute blood loss anemia    . Hypoalbuminemia    . Hyponatremia    . Metastatic carcinoma ([place])    . Occipital infarction ([place]) 05/08/2017  . Ascites    . DNR (do not resuscitate)    . Palliative care encounter    . Stroke ([place]) 05/04/2017  . AKI (acute kidney injury) ([place]) 05/04/2017  . Normocytic anemia 05/04/2017  . Acute lower UTI 05/04/2017  . Cerebral infarction ([place])    . Acute deep vein thrombosis (DVT) of popliteal vein of right lower extremity (HCC)    . Acute saddle pulmonary embolism with acute cor pulmonale (HCC)    . Acute gouty arthritis 05/01/2017  . Impaired glucose tolerance 06/12/2010  . Preventative health care 06/12/2010  . FATIGUE 08/27/2008  . Hyperlipidemia 06/25/2008  . Schizophrenia ([place]) 06/25/2008  . Essential hypertension 06/25/2008      Expected Discharge Date: Expected Discharge Date: 05/13/17   Team Members Present: Physician leading conference: Dr. Delice Lesch Social Worker Present: Ovidio Kin, LCSW Nurse Present: Isla Pence, RN OT Present: Clyda Greener, OT SLP  Present: Windell Moulding, SLP PPS Coordinator present : Daiva Nakayama, RN, CRRN       Current Status/Progress Goal Weekly Team Focus  Medical     Right hemiparesis and cognitive deficits secondary to right posterior parietal and left occipital infarcts consistent with systemic emboli  Improve mobility, safety, cognition, PE, hyponatremia  See above   Bowel/Bladder     continent B/B, Last BM 05/04/17  BM within next day; PRN sorbitol given this shift  Remain continent B&B; assess q shift and PRN   Swallow/Nutrition/ Hydration               ADL's       eval pending        Mobility     min guard gait and stairs, higher level balance impairments, decreased aerobic endurance  modI bed mobility, transfers, S gait and stairs  activity tolerance, dynamic balance   Communication               Safety/Cognition/ Behavioral Observations             Pain     Tramadol 50-100mg  Q6hrs pain; c/o pain to L chest area  <3  assess pain q shift and PRN; medicate as needed   Skin     Bruising to LLE  remain free  from skin breakdown and infection free  assess q shift and PRN; encourage mobility     *See Care Plan and progress notes for long and short-term goals.      Barriers to Discharge   Current Status/Progress Possible Resolutions Date Resolved   Physician     Medical stability;Other (comments)  PE  See above  Therapies, follow labs, anticoagulation      Nursing                 PT                    OT                 SLP            SW              Discharge Planning/Teaching Needs:    Home with husband who can assist with her care. He is here daily and will participate in her therapies with her.     Team Discussion:  Goals supervision-mod/i level. Having to I & O cath every 4 hr due to retention. Pain on left side form PE pain meds given for this. Working balance and slow to move. Nursing working on constipation. On lovenox unsure what will go home on. Sodium low MD watching. Speech not to  pick up while here.  Revisions to Treatment Plan:  DC 3/24    Continued Need for Acute Rehabilitation Level of Care: The patient requires daily medical management by a physician with specialized training in physical medicine and rehabilitation for the following conditions: Daily direction of a multidisciplinary physical rehabilitation program to ensure safe treatment while eliciting the highest outcome that is of practical value to the patient.: Yes Daily medical management of patient stability for increased activity during participation in an intensive rehabilitation regime.: Yes Daily analysis of laboratory values and/or radiology reports with any subsequent need for medication adjustment of medical intervention for : Neurological problems;Other   Elease Hashimoto 05/10/2017, 10:01 AM                 Patient ID: [name] [name], female   DOB: January 16, 1951, 67 y.o.   MRN: [id number]

## 2017-05-10 NOTE — Progress Notes (Signed)
[name] PHYSICAL MEDICINE & REHABILITATION     PROGRESS NOTE  Subjective/Complaints:  Patient seen lying in bed this morning. She states she slept well overnight. She states she is doing well. Discussed bladder retention with nursing. Discussed anticoagulation with patient.  ROS: Denies CP, SOB, N/V/D  Objective: Vital Signs: Blood pressure 129/78, pulse (!) 105, temperature 98.1 F (36.7 C), temperature source Oral, resp. rate 18, height 5\' 10"  (1.778 m), weight 84.3 kg (185 lb 13.6 oz), SpO2 97 %. No results found. Recent Labs    05/09/17 0510 05/10/17 0914  WBC 10.4 11.0*  HGB 9.8* 10.1*  HCT 30.0* 30.9*  PLT 468* 604*   Recent Labs    05/08/17 2048 05/09/17 0510  NA 125* 126*  K 4.1 4.1  CL 94* 97*  GLUCOSE 107* 100*  BUN 6 6  CREATININE 1.02* 0.94  CALCIUM 8.3* 8.3*   CBG (last 3)  No results for input(s): GLUCAP in the last 72 hours.  Wt Readings from Last 3 Encounters:  05/08/17 84.3 kg (185 lb 13.6 oz)  05/04/17 79.8 kg (175 lb 14.8 oz)  05/01/17 77.1 kg (170 lb)    Physical Exam:  BP 129/78 (BP Location: Left Arm)   Pulse (!) 105   Temp 98.1 F (36.7 C) (Oral)   Resp 18   Ht 5\' 10"  (1.778 m)   Wt 84.3 kg (185 lb 13.6 oz)   SpO2 97%   BMI 26.67 kg/m  Constitutional: She appearswell-nourished.No distress.  HENT: Normocephalic. Atraumatic. Eyes:EOMare normal. No discharge.  Cardiovascular:+Tachycardia. Normal rhythm. No JVD. Respiratory:Effort normal and breath sounds normal.  VP:XTGGY sounds are normal. She exhibitsno distension.  Musculoskeletal: She exhibits noedema and no tenderness. Neurological:Alert Slow to initiate Follow simple commands.  Processing delays.  Motor: RUE/RLE 4+/5 prox to distal.  LUE/LLE 4+/5 (stronger than right, stable) Skin. Warm and dry Psych: Flat but cooperative   Assessment/Plan: 1. Functional deficits secondary to right posterior parietal and left occipital infarcts consistent with systemic  emboli which require 3+ hours per day of interdisciplinary therapy in a comprehensive inpatient rehab setting. Physiatrist is providing close team supervision and 24 hour management of active medical problems listed below. Physiatrist and rehab team continue to assess barriers to discharge/monitor patient progress toward functional and medical goals.  Function:  Bathing Bathing position   Position: Wheelchair/chair at sink  Bathing parts Body parts bathed by patient: Right arm, Left arm, Chest, Abdomen, Front perineal area, Buttocks, Right upper leg, Left upper leg, Left lower leg, Right lower leg Body parts bathed by helper: Back  Bathing assist        Upper Body Dressing/Undressing Upper body dressing   What is the patient wearing?: Pull over shirt/dress     Pull over shirt/dress - Perfomed by patient: Thread/unthread right sleeve, Thread/unthread left sleeve, Put head through opening, Pull shirt over trunk          Upper body assist        Lower Body Dressing/Undressing Lower body dressing   What is the patient wearing?: Non-skid slipper socks         Non-skid slipper socks- Performed by patient: Don/doff right sock, Don/doff left sock                    Lower body assist Assist for lower body dressing: Supervision or verbal cues      Toileting Toileting   Toileting steps completed by patient: Adjust clothing prior to toileting, Performs perineal hygiene, Adjust  clothing after toileting      Toileting assist Assist level: Supervision or verbal cues   Transfers Chair/bed transfer   Chair/bed transfer method: Stand pivot, Ambulatory Chair/bed transfer assist level: Supervision or verbal cues Chair/bed transfer assistive device: Armrests     Locomotion Ambulation     Max distance: 90 Assist level: Touching or steadying assistance (Pt > 75%)   Wheelchair     Max wheelchair distance: 150 Assist Level: Touching or steadying assistance (Pt > 75%)   Cognition Comprehension Comprehension assist level: Follows basic conversation/direction with extra time/assistive device  Expression Expression assist level: Expresses basic needs/ideas: With extra time/assistive device  Social Interaction Social Interaction assist level: Interacts appropriately 90% of the time - Needs monitoring or encouragement for participation or interaction.  Problem Solving Problem solving assist level: Solves basic 75 - 89% of the time/requires cueing 10 - 24% of the time  Memory Memory assist level: Recognizes or recalls 90% of the time/requires cueing < 10% of the time    Medical Problem List and Plan: 1.Right hemiparesis and cognitive deficitssecondary to right posterior parietal and left occipital infarcts consistent with systemic emboli   Continue CIR 2. DVT anticoagulation/bilateral pulmonary emboli left greater than right and right lower extremity femoral vein, posterior tibial vein TFT:DDUKGUR transitioned to therapeutic Lovenox, will transition to Xarelto, risks and benefits discussed with patient with family present 3. Pain Management:Tramadol as needed 4. Mood/ schizophrenia:Lexapro 5 mg daily, Haldol 15 mg daily, cogent and 2 mg daily 5. Neuropsych: This patientisnot fully capable of making decisions on herown behalf. 6. Skin/Wound Care:Routine skin checks 7. Fluids/Electrolytes/Nutrition:Routine I&O's  8.Metastatic carcinoma. Patient declines any further workup at this time. Palliative care consulted 9.Ascites. Status post paracentesis 05/06/2017 10. Hyperlipidemia. Pravachol 11. Hyponatremia   Na 126 on 3/20   Labs pending 12. Hypoalbuminemia   Supplement initiated on 3/20 13. ABLA   Hb 10.1 on 3/21   Cont to monitor 14. Leukocytosis   WBCs 11.0 on 3/21   Continue to monitor 15.? Urinary retention   Appears to be more ascites as I/O caths are improving   Cont to monitor  LOS (Days) 2 A FACE TO FACE EVALUATION WAS  PERFORMED  [name] [name] [name] [name] 05/10/2017 9:46 AM

## 2017-05-10 NOTE — Progress Notes (Signed)
Physical Therapy Session Note  Patient Details  Name: [name] [name] MRN: [id number] Date of Birth: 1950/05/29  Today's Date: 05/10/2017 PT Individual Time: 1430-1530 PT Individual Time Calculation (min): 60 min   Short Term Goals: Week 1:  PT Short Term Goal 1 (Week 1): =LTG due to estimated LOS  Skilled Therapeutic Interventions/Progress Updates: Pt received seated in bed, denies pain and agreeable to treatment. Bed mobility modI. Transfers with S throughout session. W/c propulsion 2x75' with BUE for strengthening and aerobic endurance. Performed standing balance on airex foam pad while performing cognitive dual task putting together puzzle; two LOBs posteriorly initially upon standing on foam requiring minA to recover, improved to S overall by end of trial. Requires moderate cueing for problem solving and technique to complete puzzle. Gait in day room locating numbered targets for focus on dynamic gait with head turns, visual scanning, cognitive dual task; S overall including reaching overhead and picking up items from floor, however one major LOB when tripping over equipment on R side requiring minA to recover. Standing kinetron marching 2x1 min each for focus on aerobic endurance, BLE coordination, forced use RLE; HR following activity ranges 125-132bpm, recovers to <110 within 2-3 min rest break. Gait to return to room close S while carrying cup of water. Educated pt and husband regarding walking program with goal of 30 min/day, divided into shorter sessions as necessary. Pt and husband report they had already been talking about starting to walk together as they feel it is a lower intensity activity that the patient could tolerate. Remained seated in bed at end of session, alarm intact and husband present, all needs in reach.      Therapy Documentation Precautions:  Precautions Precautions: Fall Precaution Comments: watch HR- elevates quickly with mobility activities Restrictions Weight  Bearing Restrictions: No   See Function Navigator for Current Functional Status.   Therapy/Group: Individual Therapy  Luberta Mutter 05/10/2017, 4:05 PM

## 2017-05-10 NOTE — Discharge Instructions (Addendum)
Inpatient Rehab Discharge Instructions  [place] Discharge date and time: No discharge date for patient encounter.   Activities/Precautions/ Functional Status: Activity: activity as tolerated Diet: regular diet Wound Care: none needed Functional status:  ___ No restrictions     ___ Walk up steps independently ___ 24/7 supervision/assistance   ___ Walk up steps with assistance ___ Intermittent supervision/assistance  ___ Bathe/dress independently ___ Walk with walker     __x STROKE/TIA DISCHARGE INSTRUCTIONS SMOKING Cigarette smoking nearly doubles your risk of having a stroke & is the single most alterable risk factor  If you smoke or have smoked in the last 12 months, you are advised to quit smoking for your health.  Most of the excess cardiovascular risk related to smoking disappears within a year of stopping.  Ask you doctor about anti-smoking medications  [place] Quit Line: 1-800-QUIT NOW  Free Smoking Cessation Classes [phone]  CHOLESTEROL Know your levels; limit fat & cholesterol in your diet  Lipid Panel     Component Value Date/Time   CHOL 137 05/04/2017 0457   TRIG 139 05/04/2017 0457   HDL 38 (L) 05/04/2017 0457   CHOLHDL 3.6 05/04/2017 0457   VLDL 28 05/04/2017 0457   LDLCALC 71 05/04/2017 0457      Many patients benefit from treatment even if their cholesterol is at goal.  Goal: Total Cholesterol (CHOL) less than 160  Goal:  Triglycerides (TRIG) less than 150  Goal:  HDL greater than 40  Goal:  LDL (LDLCALC) less than 100   BLOOD PRESSURE American Stroke Association blood pressure target is less that 120/80 mm/Hg  Your discharge blood pressure is:  BP: 119/71  Monitor your blood pressure  Limit your salt and alcohol intake  Many individuals will require more than one medication for high blood pressure  DIABETES (A1c is a blood sugar average for last 3 months) Goal HGBA1c is under 7% (HBGA1c is blood sugar average for last 3 months)   Diabetes: No known diagnosis of diabetes    Lab Results  Component Value Date   HGBA1C 6.2 (H) 05/04/2017     Your HGBA1c can be lowered with medications, healthy diet, and exercise.  Check your blood sugar as directed by your physician  Call your physician if you experience unexplained or low blood sugars.  PHYSICAL ACTIVITY/REHABILITATION Goal is 30 minutes at least 4 days per week  Activity: Increase activity slowly, Therapies: Physical Therapy: Home Health Return to work:   Activity decreases your risk of heart attack and stroke and makes your heart stronger.  It helps control your weight and blood pressure; helps you relax and can improve your mood.  Participate in a regular exercise program.  Talk with your doctor about the best form of exercise for you (dancing, walking, swimming, cycling).  DIET/WEIGHT Goal is to maintain a healthy weight  Your discharge diet is: Fall precautions Diet Heart Room service appropriate? Yes; Fluid consistency: Thin  liquids Your height is:  Height: 5\' 10"  (177.8 cm) Your current weight is: Weight: 84.3 kg (185 lb 13.6 oz) Your Body Mass Index (BMI) is:  BMI (Calculated): 26.67  Following the type of diet specifically designed for you will help prevent another stroke.  Your goal weight range is:    Your goal Body Mass Index (BMI) is 19-24.  Healthy food habits can help reduce 3 risk factors for stroke:  High cholesterol, hypertension, and excess weight.  RESOURCES Stroke/Support Group:  Call [place] PROVIDED/REVIEWED  AND GIVEN TO PATIENT Stroke warning signs and symptoms How to activate emergency medical system (call 911). Medications prescribed at discharge. Need for follow-up after discharge. Personal risk factors for stroke. Pneumonia vaccine given:  Flu vaccine given:  My questions have been answered, the writing is legible, and I understand these instructions.  I will adhere to these goals & educational  materials that have been provided to me after my discharge from the hospital.   _ Bathe/dress with assistance ___ Walk Independently    ___ Shower independently ___ Walk with assistance    ___ Shower with assistance ___ No alcohol     ___ Return to work/school ________  Special Instructions:    COMMUNITY REFERRALS UPON DISCHARGE:    Home Health:   PT, OT, RN  [place]   Date of last service:05/13/2017  Medical Equipment/Items Ordered:NO EQUIPMENT NEEDS     My questions have been answered and I understand these instructions. I will adhere to these goals and the provided educational materials after my discharge from the hospital.  Patient/Caregiver Signature _______________________________ Date __________  Clinician Signature _______________________________________ Date __________  Please bring this form and your medication list with you to all your follow-up doctor's appointments.    Information on my medicine - XARELTO (rivaroxaban)  This medication education was reviewed with me or my healthcare representative as part of my discharge preparation.    WHY WAS XARELTO PRESCRIBED FOR YOU? Xarelto was prescribed to treat blood clots that may have been found in the veins of your legs (deep vein thrombosis) or in your lungs (pulmonary embolism) and to reduce the risk of them occurring again.  What do you need to know about Xarelto? The starting dose is one 15 mg tablet taken TWICE daily with food for the FIRST 21 DAYS then on 05/31/2017  the dose is changed to one 20 mg tablet taken ONCE A DAY with your evening meal.  DO NOT stop taking Xarelto without talking to the health care provider who prescribed the medication.  Refill your prescription for 20 mg tablets before you run out.  After discharge, you should have regular check-up appointments with your healthcare provider that is prescribing your Xarelto.  In the future your dose may need  to be changed if your kidney function changes by a significant amount.  What do you do if you miss a dose? If you are taking Xarelto TWICE DAILY and you miss a dose, take it as soon as you remember. You may take two 15 mg tablets (total 30 mg) at the same time then resume your regularly scheduled 15 mg twice daily the next day.  If you are taking Xarelto ONCE DAILY and you miss a dose, take it as soon as you remember on the same day then continue your regularly scheduled once daily regimen the next day. Do not take two doses of Xarelto at the same time.   Important Safety Information Xarelto is a blood thinner medicine that can cause bleeding. You should call your healthcare provider right away if you experience any of the following: ? Bleeding from an injury or your nose that does not stop. ? Unusual colored urine (red or dark brown) or unusual colored stools (red or black). ? Unusual bruising for unknown reasons. ? A serious fall or if you hit your head (even if there is no bleeding).  Some medicines may interact with Xarelto and might increase your risk of bleeding while on Xarelto. To  help avoid this, consult your healthcare provider or pharmacist prior to using any new prescription or non-prescription medications, including herbals, vitamins, non-steroidal anti-inflammatory drugs (NSAIDs) and supplements.  This website has more information on Xarelto: [email or web address]

## 2017-05-11 ENCOUNTER — Inpatient Hospital Stay (HOSPITAL_COMMUNITY): Payer: Medicare Other | Admitting: Occupational Therapy

## 2017-05-11 ENCOUNTER — Inpatient Hospital Stay (HOSPITAL_COMMUNITY): Payer: Medicare Other

## 2017-05-11 ENCOUNTER — Inpatient Hospital Stay (HOSPITAL_COMMUNITY): Payer: Medicare Other | Admitting: Physical Therapy

## 2017-05-11 DIAGNOSIS — I2699 Other pulmonary embolism without acute cor pulmonale: Secondary | ICD-10-CM

## 2017-05-11 LAB — CULTURE, BODY FLUID W GRAM STAIN -BOTTLE: Culture: NO GROWTH

## 2017-05-11 LAB — CBC
HEMATOCRIT: 28.9 % — AB (ref 36.0–46.0)
HEMOGLOBIN: 9.5 g/dL — AB (ref 12.0–15.0)
MCH: 25.7 pg — AB (ref 26.0–34.0)
MCHC: 32.9 g/dL (ref 30.0–36.0)
MCV: 78.3 fL (ref 78.0–100.0)
Platelets: 622 10*3/uL — ABNORMAL HIGH (ref 150–400)
RBC: 3.69 MIL/uL — AB (ref 3.87–5.11)
RDW: 16.2 % — ABNORMAL HIGH (ref 11.5–15.5)
WBC: 9.9 10*3/uL (ref 4.0–10.5)

## 2017-05-11 LAB — CULTURE, BODY FLUID-BOTTLE

## 2017-05-11 MED ORDER — ASPIRIN 81 MG PO TBEC: 81.0000 mg | DELAYED_RELEASE_TABLET | Freq: Every day | ORAL | Status: DC

## 2017-05-11 MED ORDER — HALOPERIDOL 10 MG PO TABS: 15.0000 mg | ORAL_TABLET | Freq: Every day | ORAL | 0 refills | Status: AC

## 2017-05-11 MED ORDER — LOVASTATIN 40 MG PO TABS: 40.0000 mg | ORAL_TABLET | Freq: Every day | ORAL | 3 refills | Status: DC

## 2017-05-11 MED ORDER — RIVAROXABAN 15 MG PO TABS: ORAL_TABLET | ORAL | 0 refills | Status: DC

## 2017-05-11 MED ORDER — BENZTROPINE MESYLATE 2 MG PO TABS: 2.0000 mg | ORAL_TABLET | Freq: Every day | ORAL | 3 refills | Status: AC

## 2017-05-11 MED ORDER — ESCITALOPRAM OXALATE 5 MG PO TABS: 5.0000 mg | ORAL_TABLET | Freq: Every day | ORAL | 0 refills | Status: AC

## 2017-05-11 MED ORDER — TRAMADOL HCL 50 MG PO TABS: 50.0000 mg | ORAL_TABLET | Freq: Four times a day (QID) | ORAL | 0 refills | Status: DC | PRN

## 2017-05-11 MED ORDER — CYANOCOBALAMIN 1000 MCG PO TABS: 1000.0000 ug | ORAL_TABLET | Freq: Every day | ORAL | 0 refills | Status: DC

## 2017-05-11 MED ORDER — RIVAROXABAN 20 MG PO TABS: ORAL_TABLET | ORAL | 1 refills | Status: DC

## 2017-05-11 NOTE — Discharge Summary (Signed)
Discharge summary job [phone]

## 2017-05-11 NOTE — Progress Notes (Signed)
Occupational Therapy Session Note  Patient Details  Name: [name] [name] MRN: [id number] Date of Birth: Oct 13, 1950  Today's Date: 05/11/2017 OT Individual Time: 1310-1407 OT Individual Time Calculation (min): 57 min    Short Term Goals: Week 1:  OT Short Term Goal 1 (Week 1): STGs equal to LTGs set at modified independent level.   Skilled Therapeutic Interventions/Progress Updates:    1;1. Pt with no c/o pain requesting to complete tx in room and work on UB strengthening/coordination. Pt completes 2x30 volleys of beachball with 3# dowel rod and VC for big chest press to bouce ball back as pt with slow and small movements. Pt completes 2x20 passes of beach ball in standing (overhead/chest pass) for BUE coordination and standing balance with CGA-supervision. Pt completes BUE therex with dumbells in all planes of motion with 2# dumbbells and demonstrational/tactile cues: shoulder flex/ext, horizontal ab/adduct int/ext rotation, chest press, overhead press, and elbow flex/ext. Pt ambulates to/from ADL apartment with CGA and 1LOB pt able to correct without A. Pt completes step over tub transfer onto shower chair with S and Vc for side stepping approach as opposed to forward stepping with VC to stabilize on wall and step wider to accommodate for feet. Exited session with pt seated in bed, husband in room and all needs met.   Therapy Documentation Precautions:  Precautions Precautions: Fall Precaution Comments: watch HR- elevates quickly with mobility activities Restrictions Weight Bearing Restrictions: No General:  See Function Navigator for Current Functional Status.   Therapy/Group: Individual Therapy  Tonny Branch 05/11/2017, 2:13 PM

## 2017-05-11 NOTE — Progress Notes (Signed)
Physical Therapy Session Note  Patient Details  Name: [name] [name] MRN: [id number] Date of Birth: May 07, 1950  Today's Date: 05/11/2017 PT Individual Time: 0802-2336 PT Individual Time Calculation (min): 40 min   Short Term Goals: Week 1:  PT Short Term Goal 1 (Week 1): =LTG due to estimated LOS  Skilled Therapeutic Interventions/Progress Updates:   Pt in w/c and agreeable to therapy, denies pain. Worked on overall endurance and LE strengthening this session w/ emphasis on building up balance and postural musculature. Ambulated 200' x2 w/ supervision, pt declined further out-of-room activity. Instructed and performed [place] B exercises as per written sheet. Pt demo-ed all correctly and verbalized understanding of precautions to take including holding onto countertop and/or husband as available for support. Pt able to self-correct any LOB w/ HHA from therapist when performing tandem walking. Performed multiple sit<>stands on airex pad w/ supervision and performed reaching activities in all directions w/ supervision and verbal cues for balance strategies. Ended session in w/c and in care of husband, all needs met.   Therapy Documentation Precautions:  Precautions Precautions: Fall Precaution Comments: watch HR- elevates quickly with mobility activities Restrictions Weight Bearing Restrictions: No Vital Signs: Therapy Vitals Temp: 98.1 F (36.7 C) Temp Source: Oral Pulse Rate: (!) 106 Resp: 18 BP: 136/80 Patient Position (if appropriate): Sitting Oxygen Therapy SpO2: 98 % O2 Device: Room Air  See Function Navigator for Current Functional Status.   Therapy/Group: Individual Therapy  Trell Secrist K Arnette 05/11/2017, 3:44 PM

## 2017-05-11 NOTE — Progress Notes (Signed)
Physical Therapy Session Note  Patient Details  Name: [name] [name] MRN: [id number] Date of Birth: 1950/04/14  Today's Date: 05/11/2017 PT Individual Time: 1130-1200 PT Individual Time Calculation (min): 30 min   Short Term Goals: Week 1:  PT Short Term Goal 1 (Week 1): =LTG due to estimated LOS  Skilled Therapeutic Interventions/Progress Updates:   Pt received sitting in WC and agreeable to PT  PT instructed pt in Modified [place] level A HEP with hand out provided. All exercises completed x 10 BLE with moderate cues for technique and reduced compensatory movements.   Floor transfer training instructed by PT with close supervision assist. Moderate cues fo technique and education for safety precautions when attempting a floor transfer.   Patient left sitting in [facility] [place] with call bell in reach and all needs met.         Therapy Documentation Precautions:  Precautions Precautions: Fall Precaution Comments: watch HR- elevates quickly with mobility activities Restrictions Weight Bearing Restrictions: No  Pain:   denies     See Function Navigator for Current Functional Status.   Therapy/Group: Individual Therapy  Lorie Phenix 05/11/2017, 1:59 PM

## 2017-05-11 NOTE — Progress Notes (Signed)
[place] PHYSICAL MEDICINE & REHABILITATION     PROGRESS NOTE  Subjective/Complaints:  Pt seen ambulating with family this AM.  She states she slept well overnight.  She is looking forward to d/c on Sunday.   ROS: Denies CP, SOB, N/V/D  Objective: Vital Signs: Blood pressure 114/74, pulse (!) 103, temperature 98.3 F (36.8 C), temperature source Oral, resp. rate 17, height 5\' 10"  (1.778 m), weight 84.3 kg (185 lb 13.6 oz), SpO2 97 %. No results found. Recent Labs    05/10/17 0914 05/11/17 0527  WBC 11.0* 9.9  HGB 10.1* 9.5*  HCT 30.9* 28.9*  PLT 604* 622*   Recent Labs    05/09/17 0510 05/10/17 0914  NA 126* 124*  K 4.1 3.7  CL 97* 95*  GLUCOSE 100* 132*  BUN 6 7  CREATININE 0.94 0.95  CALCIUM 8.3* 8.6*   CBG (last 3)  No results for input(s): GLUCAP in the last 72 hours.  Wt Readings from Last 3 Encounters:  05/08/17 84.3 kg (185 lb 13.6 oz)  05/04/17 79.8 kg (175 lb 14.8 oz)  05/01/17 77.1 kg (170 lb)    Physical Exam:  BP 114/74 (BP Location: Left Arm)   Pulse (!) 103   Temp 98.3 F (36.8 C) (Oral)   Resp 17   Ht 5\' 10"  (1.778 m)   Wt 84.3 kg (185 lb 13.6 oz)   SpO2 97%   BMI 26.67 kg/m  Constitutional: She appearswell-nourished.No distress.  HENT: Normocephalic. Atraumatic. Eyes:EOMare normal. No discharge.  Cardiovascular:+Tachycardia. Normal rhythm. No JVD. Respiratory:Effort normal and breath sounds normal.  QI:HKVQQ sounds are normal. She exhibitsno distension.  Musculoskeletal: She exhibits noedema and no tenderness. Neurological:Alert Slow to initiate Follow simple commands.  Processing delays.  Motor: RUE/RLE 4+/5 prox to distal.  LUE/LLE 4+/5 (stronger than right, unchanged) Skin. Warm and dry Psych: Flat but cooperative   Assessment/Plan: 1. 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 Physiatrist is providing close team supervision and 24 hour management of active medical problems listed below. Physiatrist and rehab team continue to assess barriers to discharge/monitor patient progress toward functional and medical goals.  Function:  Bathing Bathing position   Position: Wheelchair/chair at sink  Bathing parts Body parts bathed by patient: Right arm, Left arm, Chest, Abdomen, Front perineal area, Buttocks, Right upper leg, Left upper leg, Left lower leg, Right lower leg Body parts bathed by helper: Back  Bathing assist        Upper Body Dressing/Undressing Upper body dressing   What is the patient wearing?: Pull over shirt/dress     Pull over shirt/dress - Perfomed by patient: Thread/unthread right sleeve, Thread/unthread left sleeve, Put head through opening, Pull shirt over trunk          Upper body assist        Lower Body Dressing/Undressing Lower body dressing   What is the patient wearing?: Non-skid slipper socks         Non-skid slipper socks- Performed by patient: Don/doff right sock, Don/doff left sock                    Lower body assist Assist for lower body dressing: Supervision or verbal cues      Toileting Toileting   Toileting steps completed by patient: Adjust clothing prior to toileting, Performs perineal hygiene, Adjust clothing after toileting Toileting  steps completed by helper: Performs perineal hygiene Toileting Assistive Devices: Other (comment)(I/O cath; PVR)  Toileting assist Assist level: Two helpers   Transfers Chair/bed transfer   Chair/bed transfer method: Stand pivot, Ambulatory Chair/bed transfer assist level: Supervision or verbal cues Chair/bed transfer assistive device: Armrests     Locomotion Ambulation     Max distance: 200' Assist level: Supervision or verbal cues   Wheelchair     Max wheelchair distance: 150 Assist Level: Touching or steadying  assistance (Pt > 75%)  Cognition Comprehension Comprehension assist level: Follows basic conversation/direction with extra time/assistive device  Expression Expression assist level: Expresses basic needs/ideas: With extra time/assistive device  Social Interaction Social Interaction assist level: Interacts appropriately 90% of the time - Needs monitoring or encouragement for participation or interaction.  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 DVT anticoagulation/bilateral pulmonary emboli left greater than right and right lower extremity femoral vein, posterior tibial vein DVT:  Cont Xarelto, risks and benefits discussed with patient with family present 3. Pain Management:Tramadol as needed 4. Mood/ schizophrenia:Lexapro 5 mg daily, Haldol 15 mg daily, cogent and 2 mg daily 5. Neuropsych: This patientisnot fully capable of making decisions on herown behalf. 6. Skin/Wound Care:Routine skin checks 7. Fluids/Electrolytes/Nutrition:Routine I&O's  8.Metastatic carcinoma. Patient declines any further workup at this time. Palliative care consulted 9.Ascites. Status post paracentesis 05/06/2017 10. Hyperlipidemia. Pravachol 11. Hyponatremia  Multifactorial   Na 124 on 3/22   Will need ambulatory follow up 12. Hypoalbuminemia   Supplement initiated on 3/20 13. ABLA   Hb 9.5 on 3/22   Cont to monitor 14. Leukocytosis: Resolved   WBCs 9.9 on 3/22   Continue to monitor 15.? Urinary retention   Appears to be more  ascites as I/O caths are improving   Cont to monitor  LOS (Days) 3 A FACE TO FACE EVALUATION WAS PERFORMED  Jazzlynn Rawe Lorie Phenix 05/11/2017 10:11 AM

## 2017-05-11 NOTE — Progress Notes (Signed)
Pt voiding; PVR reporting greater than 600, I/O cath for 5cc.  This is second occurrence.  Pt is emptying bladder.   Pt will not need foley before going home.

## 2017-05-11 NOTE — Progress Notes (Signed)
Occupational Therapy Session Note  Patient Details  Name: [name] [name] MRN: [id number] Date of Birth: May 21, 1950  Today's Date: 05/11/2017 OT Individual Time: 6440-3474 OT Individual Time Calculation (min): 75 min    Short Term Goals: No short term goals set  Skilled Therapeutic Interventions/Progress Updates:    Pt seen this session for ADL training with a focus on activity tolerance and balance.  Pt states that she will feel SOB when her HR increases, so pt was encouraged to pay attention to how she feels and stop and rest when needed. Pt stated she would prefer to shower on Saturday and sponge bathe today. Pt was able to complete all self care at sink with mod I. Pt stood 75% of the time, doffing clothing in standing.  She then ambulated to kitchen to practice reaching for items needed to make coffee. Pt states she does not do any of the cooking, but does make coffee. She also practiced reaching for items in cupboards and pantry with R hand.  No difficulty with vision finding items or retrieving them.  R shoulder strength now 4+/5 and coordination WFL.   Spouse and pt stated that she practiced stepping in and out of the tub the other day with another therapist and did not feel she needed more practice.  Pt ambulated back to room for UE/ LE exercises.   Pt worked with 3 lb dowel bar for shoulder and elbow strengthening and activity tolerance. Pt encouraged to complete as many reps as she can and stop when she feels fatigue vs. Counting.    Pt then worked on a standing endurance activity of completing a peg board puzzle activity with 90% accuracy.   Standing balance with min A while alternating front taps on various targets on the floor.  Pt tolerated therapy well . Pt resting in wc with spouse in the room with pt.    Therapy Documentation Precautions:  Precautions Precautions: Fall Precaution Comments: watch HR- elevates quickly with mobility activities Restrictions Weight Bearing  Restrictions: No    Vital Signs: Therapy Vitals Temp: 98.3 F (36.8 C) Temp Source: Oral Pulse Rate: (!) 103 Resp: 17 BP: 114/74 Patient Position (if appropriate): Lying Oxygen Therapy SpO2: 97 % O2 Device: Room Air Pain: Pain Assessment Pain Scale: 0-10 Pain Score: 4  Pain Type: Acute pain Pain Location: Chest Pain Orientation: Left Pain Descriptors / Indicators: Nagging Pain Frequency: Intermittent Pain Onset: Gradual Pain Intervention(s): Medication (See eMAR) ADL:    See Function Navigator for Current Functional Status.   Therapy/Group: Individual Therapy  [place] 05/11/2017, 10:23 AM

## 2017-05-11 NOTE — Discharge Summary (Signed)
[name] [name], [name] [name]                 ACCOUNT NO.:  [id number]  MEDICAL RECORD NO.:  [id number]  LOCATION:                                 FACILITY:  PHYSICIAN:  Delice Lesch, MD        DATE OF BIRTH:  [birth year]  DATE OF ADMISSION:  05/08/2017 DATE OF DISCHARGE:                              DISCHARGE SUMMARY   DISCHARGE DIAGNOSES: 1. 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 2. Bilateral pulmonary emboli, left greater than right and right lower     extremity femoral vein posterior tibial deep venous thrombosis. 3. Pain management. 4. Schizophrenia. 5. Metastatic carcinoma. 6. Ascites status post paracentesis. 7. Hyponatremia. 8. Acute blood loss anemia. 9. Urinary retention.  HISTORY OF PRESENT ILLNESS:  This is a 67 year old right-handed female with history of hypertension and schizophrenia who presented May 04, 2017 with blurred vision, right-sided weakness and decreased appetite. She lives with her husband, independent prior to admission.  Cranial CT scan showed small bilateral occipital to early subacute infarctions without hemorrhage or mass effect.  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  A CT of the chest positive for large burden of what appeared to be chronic thrombus and acute pulmonary emboli in the lungs bilaterally left greater than right as well as small pulmonary nodules scattered throughout the lungs bilaterally concerning for metastatic lesions. Also noted large volume of ascites in the abdomen likely malignant given the apparent omental nodularity and underwent paracentesis May 06, 2017 with 3.8 L of fluid removed.  Vascular studies of lower extremities positive for DVT, right lower extremity in a femoral vein posterior tibial veins.  MRI of the brain demonstrated multiple additional subcentimeter foci of late subacute to chronic infarcts  scattered throughout the supratentorial and infratentorial brain with few additional punctate foci and left frontal, right parietal and left thalamus.  Placed on intravenous heparin for pulmonary emboli, planning long-term anticoagulation.  TCD study negative for PFO.  The patient had declined any further workup for metastatic carcinoma and consultation obtained with palliative care.  Physical and occupational therapy ongoing.  The patient was admitted for comprehensive rehab program.  PAST MEDICAL HISTORY:  See discharge diagnoses.  SOCIAL HISTORY:  Lives with spouse, independent prior to admission.  FUNCTIONAL STATUS UPON ADMISSION TO REHAB SERVICES:  Minimal guard 120 feet without assistive device, min to mod assist activities daily living.  PHYSICAL EXAMINATION:  VITAL SIGNS: Blood pressure 126/79, pulse 99, temperature 98, and respirations 18. GENERAL: Alert female.  Mood was flat, but appropriate.  Followed simple commands. HEENT: EOMs intact. NECK: Supple.  Nontender.  No JVD. CARDIAC: Rate controlled. ABDOMEN: Soft, nontender.  Good bowel sounds. LUNGS: Clear to auscultation without wheeze.  Motor strength 4/5 proximal to distal.  REHABILITATION HOSPITAL COURSE:  The patient was admitted to Inpatient Rehab Services.  Therapies initiated on a 3-hour daily basis consisting of physical therapy, occupational therapy, and rehabilitation nursing. The following issues were addressed during the patient's rehabilitation stay.  Pertaining to Ms. Everman's right posterior parietal left occipital infarcts remained stable, maintained on aspirin  therapy.  She would follow up with Neurology Services.  Findings of bilateral pulmonary emboli as well as right lower extremity DVT, initially on Lovenox and transitioned to Xarelto.  No bleeding episodes.  No increasing shortness of breath or chest pain.  The patient with noted history of schizophrenia.  She remained on her Lexapro, Haldol and  Cogentin.  She was cooperative with full therapies.  Noted findings of metastatic carcinoma.  The patient had declined any further workup after long discussions.  Palliative care consulted.  She had undergone paracentesis May 06, 2017 for ascites with 3.8 L of fluid removed.  Acute blood loss anemia 10.1 and monitored.  The patient received weekly collaborative interdisciplinary team conferences to discuss estimated length of stay, family teaching, any barriers to her discharge. Sessions focused on energy conservation techniques, balance strategies. She was ambulating to and from therapies with close supervision without assistive device and modest cues.  She can gather her belongings for activities of daily living and homemaking, demonstrates some difficulty with saccadic movement with scanning from lower to upper in a diagonal pattern.  Her husband would provide the necessary supervision needed at home.  Full family teaching was completed and plan discharged on May 13, 2017.  DISCHARGE MEDICATIONS:  Included: 1. Aspirin 81 mg p.o. daily. 2. Cogentin 2 mg p.o. daily. 3. Lexapro 5 mg p.o. daily. 4. Haldol 15 mg p.o. daily. 5. Pravachol 40 mg p.o. daily. 6. Xarelto 15 mg b.i.d. transitioning to 20 mg daily after May 31, 2017. 7. Ultram 50 to 100 mg every 6 hours as needed pain. 8. Vitamin B12 1000 mcg p.o. daily.  FOLLOWUP:  The patient would follow up with Dr. Delice Lesch at the Outpatient Rehab Service office as directed; Dr. Erlinda Hong, Neurology Services call for appointment; Cathlean Cower, Medical Management.     Lauraine Rinne, P.A.   ______________________________ Delice Lesch, MD    DA/MEDQ  D:  05/11/2017  T:  05/11/2017  Job:  [id number]  cc:   Biagio Borg, MD Delice Lesch, MD Dr. Erlinda Hong

## 2017-05-11 NOTE — Progress Notes (Signed)
Social Work  Discharge Note  The overall goal for the admission was met for:   Discharge location: Yes-HOME WITH HUSBAND WHO CAN PROVIDE 24 HR SUPERVISION  Length of Stay: Yes-6 DAYS  Discharge activity level: Yes-SUPERVISION LEVEL  Home/community participation: Yes  Services provided included: MD, RD, PT, OT, SLP, RN, CM, Pharmacy and SW  Financial Services: Medicare  Follow-up services arranged: Home Health: [facility]-PT,OT,RN and Patient/Family has no preference for HH/DME agencies  Comments (or additional information):HUSBAND IS HERE DAILY AND ATTENDING THERAPIES WITH PT VERY INVOLVED. PT THINKING ABOUT TREATMENT OPTIONS AND WILL MEET ONCOLOGIST AS AN OP. HUSBAND WOULD LIKE HER TO DO CHEMO.  Patient/Family verbalized understanding of follow-up arrangements: Yes  Individual responsible for coordination of the follow-up plan: KEN-HUSBAND  Confirmed correct DME delivered: Elease Hashimoto 05/11/2017    Elease Hashimoto

## 2017-05-11 NOTE — Discharge Summary (Signed)
Physician Discharge Summary  [name] [name] ZSW:[id number] DOB: 1950/06/20 DOA: 05/03/2017  PCP: Biagio Borg, MD  Admit date: 05/03/2017 Discharge date: 05/11/2017  Time spent: > 35 minutes  Recommendations for Outpatient Follow-up:  F/u with physical medicine and rehabilitation F/u with paracentesis results (cytology)  Discharge Diagnoses:  Principal Problem:   Stroke [facility]) Active Problems:   Schizophrenia ([place])   Essential hypertension   AKI (acute kidney injury) ([place])   Normocytic anemia   Acute lower UTI   Cerebral infarction ([place])   Acute deep vein thrombosis (DVT) of popliteal vein of right lower extremity ([place])   Acute saddle pulmonary embolism with acute cor pulmonale (HCC)   Ascites   DNR (do not resuscitate)   Palliative care encounter   Discharge Condition: stable  Diet recommendation: heart healthy  Filed Weights   05/04/17 0300  Weight: 79.8 kg (175 lb 14.8 oz)    History of present illness:  67 y.o.femalewith medical history significant forschizophrenia, coronary artery disease, and hypertension, now presenting to the emergency department for evaluation of weakness, malaise, blurred vision, and loss of appetite. Patient reports that she been in her usual state of health until approximately 3-4 days ago when she developed a nonspecific malaise and loss of appetite.  Pt diagnosed with metastatic carcinoma unknown type, PE/DVT, acute stroke and elevated troponin most likey due to PE. Cardiology consulted for elevated troponin but patient was found to have PE as such has signed off  Discussed case with pulmonologist. Obtained paracentesis with cytology to assist with diagnosis of metastatic carcinoma. Pt will need PET scan in the future. But given all this after my discussion with patient she did not wish for any further workup or even chemotherapy. As such I have placed consult to palliative care for goals of care.  Hospital Course:  Principal Problem:    Stroke [facility]) - Neurology managing - Most likely due to hypercoagulability secondary to metastatic carcinoma  Metastatic carcinoma - U/S paracentesis order placed and patient is s/p paracentesis. Hopes are to obtain diagnosis with cytology evaluation.   - consult IR for inpatient rehab   Acute deep vein thrombosis (DVT) of popliteal vein of right lower extremity (HCC)/Acute saddle pulmonary embolism with acute cor pulmonale (HCC) - Secondary to metastatic carcinoma. Continue anticoagulation with heparin. Moving forward will be challenging what to place patient on since she has metastatic carcinoma. Will consult pharmacy to transition to Lovenox.  Active Problems:   Schizophrenia ([place]) - Stable currently continue current medical regimen    Essential hypertension - gradually normalize 5-7 days after stroke    AKI (acute kidney injury) ([place]) - Stable    Normocytic anemia   Acute lower UTI - Pt completed > 3 days on Rocephin - urine culture non conclusive   Procedures:    Consultations: PM&R Neurology  Discharge Exam: Vitals:   05/08/17 1255 05/08/17 1600  BP: 118/78 128/81  Pulse: (!) 101 98  Resp: 16 16  Temp: 98.6 F (37 C) 98.5 F (36.9 C)  SpO2: 97% 97%    General: Pt in nad, alert and awake Cardiovascular: rrr, no rubs Respiratory: no increased wob, no wheezes  Discharge Instructions   Discharge Instructions    Ambulatory referral to Neurology   Complete by:  As directed    An appointment is requested in approximately: 6 - 8 weeks     Allergies as of 05/08/2017      Reactions   Nabumetone Rash      Medication List  ASK your doctor about these medications   aspirin 81 MG EC tablet Take 81 mg by mouth daily.   benztropine 2 MG tablet Commonly known as:  COGENTIN Take 1 tablet (2 mg total) by mouth daily.   colchicine 0.6 MG tablet 1 tab by mouth as needed for gout pain and swelling with repeat 1 per hour until improved or diarrhea    escitalopram 5 MG tablet Commonly known as:  LEXAPRO Take 5 mg by mouth daily.   haloperidol 10 MG tablet Commonly known as:  HALDOL Take 15 mg by mouth daily. Take 1 1/2 tab by mouth daily   lisinopril-hydrochlorothiazide 20-12.5 MG tablet Commonly known as:  PRINZIDE,ZESTORETIC Take 2 tablets by mouth daily.   lovastatin 40 MG tablet Commonly known as:  MEVACOR Take 1 tablet (40 mg total) by mouth daily.      Allergies  Allergen Reactions  . Nabumetone Rash   Follow-up Information    Rosalin Hawking, MD. Schedule an appointment as soon as possible for a visit in 6 week(s).   Specialty:  Neurology Why:  Office should call you with an appointment. Contact information: [address] [place] [address] [phone]            The results of significant diagnostics from this hospitalization (including imaging, microbiology, ancillary and laboratory) are listed below for reference.    Significant Diagnostic Studies: Ct Angio Head W Or Wo Contrast  Result Date: 05/04/2017 CLINICAL DATA:  Stroke follow-up. Bilateral occipital infarcts on CT. Right-sided weakness. Blurry vision. Dizziness. EXAM: CT ANGIOGRAPHY HEAD AND NECK TECHNIQUE: Multidetector CT imaging of the head and neck was performed using the standard protocol during bolus administration of intravenous contrast. Multiplanar CT image reconstructions and MIPs were obtained to evaluate the vascular anatomy. Carotid stenosis measurements (when applicable) are obtained utilizing NASCET criteria, using the distal internal carotid diameter as the denominator. CONTRAST:  50 mL Isovue 370 COMPARISON:  Noncontrast head CT earlier today. No prior angiographic imaging. FINDINGS: CTA NECK FINDINGS Aortic arch: Normal variant aortic arch branching pattern with a common origin of the brachiocephalic and left common carotid arteries. Widely patent arch vessel origins. Right carotid system: Widely patent common carotid  artery. ICA occlusion at its origin without reconstitution in the neck. Patent ECA. Left carotid system: Patent without evidence of stenosis or dissection. Vertebral arteries: Patent and codominant without evidence of stenosis or dissection. Skeleton: Moderate disc degeneration throughout the cervical spine. Other neck: No mass or enlarged lymph nodes. Upper chest: Clear lung apices. Review of the MIP images confirms the above findings CTA HEAD FINDINGS Anterior circulation: The intracranial right ICA is occluded proximally with distal reconstitution at the supraclinoid level via the posterior communicating artery. The intracranial left ICA is widely patent. ACAs and MCAs are patent with mild branch vessel irregularity but no evidence of proximal branch occlusion or significant stenosis. No aneurysm or vascular malformation. Posterior circulation: The intracranial vertebral arteries are widely patent to the basilar. Left PICA, bilateral AICA, and bilateral SCA origins are patent. Right PICA is not well seen. The basilar artery is widely patent. There are small to medium-sized right and diminutive left posterior communicating arteries. PCAs are patent without evidence of significant stenosis. No aneurysm or vascular malformation. Venous sinuses: Patent. Anatomic variants: None. Delayed phase: Left caudate lacunar infarct, more conspicuous than on the earlier CT though favored to be chronic given evidence of mild volume loss. No abnormal enhancement. Review of the MIP images confirms the above  findings IMPRESSION: 1. 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 2. Otherwise patent major arterial vasculature in the circle of Willis and neck without evidence of significant stenosis. These results were communicated to Dr. Rosalin Hawking at 11:52 am on 05/04/2017 by text page via the [facility] messaging system. Electronically Signed   By: Logan Bores M.D.   On: 05/04/2017 11:53   Dg Chest 2 View  Result  Date: 05/04/2017 CLINICAL DATA:  Acute onset of right-sided weakness. Dizziness. Tachycardia and orthostatic hypotension. EXAM: CHEST - 2 VIEW COMPARISON:  None. FINDINGS: There is mild elevation of the right hemidiaphragm. There is no evidence of focal opacification, pleural effusion or pneumothorax. The heart is normal in size; the mediastinal contour is within normal limits. No acute osseous abnormalities are seen. IMPRESSION: Mild elevation of the right hemidiaphragm. Lungs remain grossly clear. Electronically Signed   By: Garald Balding M.D.   On: 05/04/2017 01:25   Ct Head Wo Contrast  Result Date: 05/04/2017 CLINICAL DATA:  Right-sided weakness and dizziness EXAM: CT HEAD WITHOUT CONTRAST TECHNIQUE: Contiguous axial images were obtained from the base of the skull through the vertex without intravenous contrast. COMPARISON:  None. FINDINGS: Brain: Areas of focal hypoattenuation within both occipital lobes suggesting recent infarction. No hemorrhage or mass effect. Otherwise normal appearance of the brain parenchyma and extra axial spaces for age. Vascular: No hyperdense vessel or unexpected vascular calcification. Skull: Normal visualized skull base, calvarium and extracranial soft tissues. Sinuses/Orbits: No sinus fluid levels or advanced mucosal thickening. No mastoid effusion. Normal orbits. 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 MRI recommended. These results were called by telephone at the time of interpretation on 05/04/2017 at 1:23 am to Dr. Ezequiel Essex , who verbally acknowledged these results. Electronically Signed   By: Ulyses Jarred M.D.   On: 05/04/2017 01:23   Ct Angio Neck W Or Wo Contrast  Result Date: 05/04/2017 CLINICAL DATA:  Stroke follow-up. Bilateral occipital infarcts on CT. Right-sided weakness. Blurry vision. Dizziness. EXAM: CT ANGIOGRAPHY HEAD AND NECK TECHNIQUE: Multidetector CT imaging of the head and neck  was performed using the standard protocol during bolus administration of intravenous contrast. Multiplanar CT image reconstructions and MIPs were obtained to evaluate the vascular anatomy. Carotid stenosis measurements (when applicable) are obtained utilizing NASCET criteria, using the distal internal carotid diameter as the denominator. CONTRAST:  50 mL Isovue 370 COMPARISON:  Noncontrast head CT earlier today. No prior angiographic imaging. FINDINGS: CTA NECK FINDINGS Aortic arch: Normal variant aortic arch branching pattern with a common origin of the brachiocephalic and left common carotid arteries. Widely patent arch vessel origins. Right carotid system: Widely patent common carotid artery. ICA occlusion at its origin without reconstitution in the neck. Patent ECA. Left carotid system: Patent without evidence of stenosis or dissection. Vertebral arteries: Patent and codominant without evidence of stenosis or dissection. Skeleton: Moderate disc degeneration throughout the cervical spine. Other neck: No mass or enlarged lymph nodes. Upper chest: Clear lung apices. Review of the MIP images confirms the above findings CTA HEAD FINDINGS Anterior circulation: The intracranial right ICA is occluded proximally with distal reconstitution at the supraclinoid level via the posterior communicating artery. The intracranial left ICA is widely patent. ACAs and MCAs are patent with mild branch vessel irregularity but no evidence of proximal branch occlusion or significant stenosis. No aneurysm or vascular malformation. Posterior circulation: The intracranial vertebral arteries are widely patent to the basilar. Left PICA, bilateral AICA,  and bilateral SCA origins are patent. Right PICA is not well seen. The basilar artery is widely patent. There are small to medium-sized right and diminutive left posterior communicating arteries. PCAs are patent without evidence of significant stenosis. No aneurysm or vascular malformation.  Venous sinuses: Patent. Anatomic variants: None. Delayed phase: Left caudate lacunar infarct, more conspicuous than on the earlier CT though favored to be chronic given evidence of mild volume loss. No abnormal enhancement. Review of the MIP images confirms the above findings IMPRESSION: 1. 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 2. Otherwise patent major arterial vasculature in the circle of Willis and neck without evidence of significant stenosis. These results were communicated to Dr. Rosalin Hawking at 11:52 am on 05/04/2017 by text page via the [facility] messaging system. Electronically Signed   By: Logan Bores M.D.   On: 05/04/2017 11:53   Ct Angio Chest Pe W Or Wo Contrast  Result Date: 05/04/2017 CLINICAL DATA:  67 year old female with history of acute deep venous thrombosis today. EXAM: CT ANGIOGRAPHY CHEST WITH CONTRAST TECHNIQUE: Multidetector CT imaging of the chest was performed using the standard protocol during bolus administration of intravenous contrast. Multiplanar CT image reconstructions and MIPs were obtained to evaluate the vascular anatomy. CONTRAST:  55mL ISOVUE-370 IOPAMIDOL (ISOVUE-370) INJECTION 76% COMPARISON:  No priors. FINDINGS: Cardiovascular: In the distal left main pulmonary artery extending into lobar, segmental and subsegmental sized branches throughout the left lung there is a large filling defect, compatible with pulmonary embolism. This appears to be predominantly nonocclusive, and some of the filling defects are centrally located while others are more eccentric, suggesting a combination of both acute and chronic thrombus. In addition, in the distal right main pulmonary artery there is a small filling defect, compatible with additional embolism. Heart size is upper limits of normal, however, right ventricle appears dilated when compared with the (right ventricular diameter of 44 mm versus 38 mm on the left (RV to LV ratio of 1.16)). There is no  significant pericardial fluid, thickening or pericardial calcification. Mediastinum/Nodes: No pathologically enlarged mediastinal or hilar lymph nodes. Esophagus is unremarkable in appearance. No axillary lymphadenopathy. Lungs/Pleura: Several scattered small 2-5 mm pulmonary nodules are noted throughout the lungs bilaterally. Patchy areas of ground-glass attenuation in the lungs bilaterally interspersed with areas of lucency, which could reflect heterogeneous perfusion of the lungs in the setting of pulmonary embolism. No confluent consolidative airspace disease. Trace bilateral pleural effusions lying dependently. Upper Abdomen: Diffuse low attenuation throughout the visualized hepatic parenchyma, indicative of hepatic steatosis. Large volume of ascites incompletely imaged. Nodularity throughout the omentum, concerning for intraperitoneal spread of malignancy. Left kidney is severely atrophic. Musculoskeletal: There are no aggressive appearing lytic or blastic lesions noted in the visualized portions of the skeleton. Review of the MIP images confirms the above findings. IMPRESSION: 1. Study is positive for a large burden of what appears to be both chronic thrombus an acute pulmonary embolism in the lungs bilaterally (left much greater than right), with CT evidence of right heart strain (RV/LV Ratio = 1.16) consistent with at least submassive (intermediate risk) PE. The presence of right heart strain has been associated with an increased risk of morbidity and mortality. Please activate Code PE by paging [phone]. 2. Multiple small pulmonary nodules scattered throughout the lungs bilaterally measuring 2-5 mm in size. These are nonspecific, but given the findings in the abdomen, these are concerning for potential metastatic lesions. Attention on follow-up studies is recommended. 3. Trace bilateral pleural effusions lying  dependently. 4. Large volume of ascites in the abdomen is likely malignant given the apparent  omental nodularity. Further evaluation with dedicated CT the abdomen and pelvis with IV contrast is recommended in the near future to evaluate for source of primary malignancy. Critical Value/emergent results were called by telephone at the time of interpretation on 05/04/2017 at 7:34 pm to nurse Jenny Reichmann for Dr. Landis Gandy, who verbally acknowledged these results. Electronically Signed   By: Vinnie Langton M.D.   On: 05/04/2017 19:35   Mr Brain Wo Contrast  Result Date: 05/04/2017 CLINICAL DATA:  67 y/o F; stroke follow-up. Right-sided weakness and blurred vision. EXAM: MRI HEAD WITHOUT CONTRAST TECHNIQUE: Axial DWI, coronal DWI, sagittal T1 FLAIR, axial T2 FLAIR, and axial SWAN sequences were acquired. COMPARISON:  05/04/2017 CT of head and CTA of head. FINDINGS: Brain: Small regions of reduced diffusion in right posterior parietal and left occipital lobes compatible with acute/early subacute infarction. Additionally there are a few punctate foci with reduced diffusion on ADC in left frontal and right parietal lobes as well as left thalamus. There are numerous additional foci of hyperintensity scattered throughout the supratentorial and infratentorial brain with intermediate to increased diffusion on ADC compatible with late subacute to chronic infarctions. Curvilinear susceptibility hypointensity within the right posterior parietal acute infarction is compatible with petechial hemorrhage. No additional susceptibility hypointensity to indicate intracranial hemorrhage. No hydrocephalus or significant mass effect. Areas of infarction are associated with T2 FLAIR hyperintense signal abnormality and there is a background of moderate chronic microvascular ischemic changes of the brain. Vascular: Loss of flow void in right petrous and cavernous ICA with known occlusion. Skull and upper cervical spine: Normal marrow signal. Sinuses/Orbits: Negative. Other: None. IMPRESSION: 1. Small regions of acute/early  subacute infarction within right posterior parietal and left occipital lobes stable from prior CT given differences in technique. Few additional punctate foci in left frontal, right parietal, and left thalamus. 2. Petechial hemorrhage associated with right posterior parietal infarction. 3. Multiple additional subcentimeter foci of late subacute to chronic infarction scattered throughout the supratentorial and infratentorial brain. 4. Background of moderate chronic microvascular ischemic changes of the brain. Electronically Signed   By: Kristine Garbe M.D.   On: 05/04/2017 21:05   US Paracentesis  Result Date: 05/06/2017 INDICATION: Ascites EXAM: ULTRASOUND-GUIDED PARACENTESIS COMPARISON:  None. MEDICATIONS: 10 cc 1% lidocaine. COMPLICATIONS: None immediate. TECHNIQUE: Informed written consent was obtained from the patient after a discussion of the risks, benefits and alternatives to treatment. A timeout was performed prior to the initiation of the procedure. Initial ultrasound scanning demonstrates a large amount of ascites within the left lower abdominal quadrant. The left lower abdomen was prepped and draped in the usual sterile fashion. 1% lidocaine with epinephrine was used for local anesthesia. Under direct ultrasound guidance, a 19 gauge, 7-cm, Yueh catheter was introduced. An ultrasound image was saved for documentation purposed. The paracentesis was performed. The catheter was removed and a dressing was applied. The patient tolerated the procedure well without immediate post procedural complication. FINDINGS: A total of approximately 3.8 liters of yellow fluid was removed. Samples were sent to the laboratory as requested by the clinical team. IMPRESSION: Successful ultrasound-guided paracentesis yielding 3.8 liters of peritoneal fluid. Read by Lavonia Drafts [facility] Electronically Signed   By: Jerilynn Mages.  Shick M.D.   On: 05/06/2017 12:03    Microbiology: Recent Results (from the past 240 hour(s))   Urine Culture     Status: Abnormal   Collection Time: 05/04/17  7:25  PM  Result Value Ref Range Status   Specimen Description URINE, RANDOM  Final   Special Requests NONE  Final   Culture (A)  Final    <10,000 COLONIES/mL INSIGNIFICANT GROWTH Performed at [facility], [place] [address], [place], [place] [address]    Report Status 05/05/2017 FINAL  Final  Culture, body fluid-bottle     Status: None   Collection Time: 05/06/17 11:48 AM  Result Value Ref Range Status   Specimen Description ASCITIC  Final   Special Requests NONE  Final   Culture   Final    NO GROWTH 5 DAYS Performed at [facility] [address] [place], [place] [address]    Report Status 05/11/2017 FINAL  Final  Gram stain     Status: None   Collection Time: 05/06/17 11:48 AM  Result Value Ref Range Status   Specimen Description ASCITIC  Final   Special Requests NONE  Final   Gram Stain   Final    MODERATE WBC PRESENT, PREDOMINANTLY MONONUCLEAR NO ORGANISMS SEEN Performed at [facility], [place] [address] [place], [place] [address]    Report Status 05/06/2017 FINAL  Final     Labs: Basic Metabolic Panel: Recent Labs  Lab 05/08/17 2048 05/09/17 0510 05/10/17 0914  NA 125* 126* 124*  K 4.1 4.1 3.7  CL 94* 97* 95*  CO2 22 24 21*  GLUCOSE 107* 100* 132*  BUN 6 6 7   CREATININE 1.02* 0.94 0.95  CALCIUM 8.3* 8.3* 8.6*   Liver Function Tests: Recent Labs  Lab 05/09/17 0510  AST 28  ALT 12*  ALKPHOS 71  BILITOT 0.5  PROT 5.4*  ALBUMIN 1.8*   No results for input(s): LIPASE, AMYLASE in the last 168 hours. No results for input(s): AMMONIA in the last 168 hours. CBC: Recent Labs  Lab 05/07/17 0921 05/08/17 0547 05/09/17 0510 05/10/17 0914 05/11/17 0527  WBC 12.9* 12.8* 10.4 11.0* 9.9  NEUTROABS  --   --  8.1*  --   --   HGB 10.3* 10.4* 9.8* 10.1* 9.5*  HCT 30.6* 30.9* 30.0* 30.9* 28.9*  MCV 78.1 78.2 78.3 78.6 78.3  PLT 333 413* 468* 604* 622*   Cardiac  Enzymes: Recent Labs  Lab 05/04/17 1653  TROPONINI 0.15*   BNP: BNP (last 3 results) No results for input(s): BNP in the last 8760 hours.  ProBNP (last 3 results) No results for input(s): PROBNP in the last 8760 hours.  CBG: No results for input(s): GLUCAP in the last 168 hours.     Signed:  Velvet Bathe MD.  [facility] 05/11/2017, 3:51 PM

## 2017-05-12 ENCOUNTER — Inpatient Hospital Stay (HOSPITAL_COMMUNITY): Payer: Medicare Other | Admitting: Physical Therapy

## 2017-05-12 ENCOUNTER — Encounter (HOSPITAL_COMMUNITY): Payer: Medicare Other | Admitting: Occupational Therapy

## 2017-05-12 ENCOUNTER — Inpatient Hospital Stay (HOSPITAL_COMMUNITY): Payer: Medicare Other | Admitting: Occupational Therapy

## 2017-05-12 LAB — CBC
HEMATOCRIT: 29 % — AB (ref 36.0–46.0)
HEMOGLOBIN: 9.8 g/dL — AB (ref 12.0–15.0)
MCH: 26.6 pg (ref 26.0–34.0)
MCHC: 33.8 g/dL (ref 30.0–36.0)
MCV: 78.6 fL (ref 78.0–100.0)
Platelets: 698 10*3/uL — ABNORMAL HIGH (ref 150–400)
RBC: 3.69 MIL/uL — ABNORMAL LOW (ref 3.87–5.11)
RDW: 16.9 % — AB (ref 11.5–15.5)
WBC: 8.5 10*3/uL (ref 4.0–10.5)

## 2017-05-12 NOTE — Progress Notes (Signed)
[name] [name] is a 67 y.o. female January 26, 1951 [id number]  Subjective: No complaints or new problems. Slept well. Feeling OK. Anxious for DC home tomorrow!  Objective: Vital signs in last 24 hours: Temp:  [98.1 F (36.7 C)-98.3 F (36.8 C)] 98.3 F (36.8 C) (03/23 6073) Pulse Rate:  [95-106] 95 (03/23 0613) Resp:  [18] 18 (03/23 0613) BP: (128-136)/(68-80) 128/68 (03/23 0613) SpO2:  [95 %-98 %] 95 % (03/23 7106) Weight change:  Last BM Date: 05/10/17  Intake/Output from previous day: 03/22 0701 - 03/23 0700 In: 780 [P.O.:780] Out: 622 [Urine:622]  Physical Exam General: No apparent distress   Spouse in room Lungs: Normal effort. Lungs clear to auscultation, no crackles or wheezes. Cardiovascular: Regular rate and rhythm, no edema Neurological: No new neurological deficits  Lab Results: BMET    Component Value Date/Time   NA 124 (L) 05/10/2017 0914   K 3.7 05/10/2017 0914   CL 95 (L) 05/10/2017 0914   CO2 21 (L) 05/10/2017 0914   GLUCOSE 132 (H) 05/10/2017 0914   BUN 7 05/10/2017 0914   CREATININE 0.95 05/10/2017 0914   CALCIUM 8.6 (L) 05/10/2017 0914   GFRNONAA >60 05/10/2017 0914   GFRAA >60 05/10/2017 0914   CBC    Component Value Date/Time   WBC 8.5 05/12/2017 0546   RBC 3.69 (L) 05/12/2017 0546   HGB 9.8 (L) 05/12/2017 0546   HCT 29.0 (L) 05/12/2017 0546   PLT 698 (H) 05/12/2017 0546   MCV 78.6 05/12/2017 0546   MCH 26.6 05/12/2017 0546   MCHC 33.8 05/12/2017 0546   RDW 16.9 (H) 05/12/2017 0546   LYMPHSABS 1.1 05/09/2017 0510   MONOABS 1.1 (H) 05/09/2017 0510   EOSABS 0.1 05/09/2017 0510   BASOSABS 0.0 05/09/2017 0510   CBG's (last 3):  No results for input(s): GLUCAP in the last 72 hours. LFT's Lab Results  Component Value Date   ALT 12 (L) 05/09/2017   AST 28 05/09/2017   ALKPHOS 71 05/09/2017   BILITOT 0.5 05/09/2017    Studies/Results: No results found.  Medications:  I have reviewed the patient's current medications. Scheduled  Medications: . aspirin EC  81 mg Oral Daily  . benztropine  2 mg Oral Daily  . escitalopram  5 mg Oral Daily  . feeding supplement (PRO-STAT SUGAR FREE 64)  30 mL Oral BID  . haloperidol  15 mg Oral Daily  . pravastatin  40 mg Oral q1800  . rivaroxaban  15 mg Oral BID WC   Followed by  . [START ON 05/31/2017] rivaroxaban  20 mg Oral Q supper  . 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 Occipital CVA, vision and motor coordination improved with CIR - continue med mgmt as ongoing. 2. Met carcinoma, advanced per imaging studies. S/p pall care eval during acute hospitalization and no intention for further onc eval or treatment at this time. OP follow up with PCP as planned. 3. B PE, chronic with acute components, and DVTs - on full anticoag given suspected malignancy related hypercoagulable state  Length of stay, days: 4   Valerie A. Asa Lente, MD 05/12/2017, 9:58 AM

## 2017-05-12 NOTE — Progress Notes (Signed)
Occupational Therapy Discharge Summary  Patient Details  Name: [name] [name] MRN: [id number] Date of Birth: 04-28-1950  Patient has met 41 of 11 long term goals due to improved activity tolerance, improved balance, postural control, ability to compensate for deficits and functional use of  RIGHT upper and RIGHT lower extremity.  Patient to discharge at overall Modified Independent level.  Patient's care partner is independent to provide the necessary physical assistance at discharge for higher level iADL tasks.   Reasons goals not met: n/a  Recommendation:  No further OT follow up recommended at this time.  Equipment: No equipment provided  Reasons for discharge: treatment goals met and discharge from hospital  Patient/family agrees with progress made and goals achieved: Yes  OT Discharge Precautions/Restrictions  Precautions Precautions: Fall Restrictions Weight Bearing Restrictions: No Pain  none/denies pain ADL ADL Eating: Independent Grooming: Independent Upper Body Bathing: Modified independent Lower Body Bathing: Modified independent Upper Body Dressing: Independent Lower Body Dressing: Modified independent Toileting: Independent Toilet Transfer: Modified independent Tub/Shower Transfer: Modified independent Social research officer, government: Modified independent ADL Comments: Please see functional navigator  Vision Eye Alignment: Within Functional Limits Perception  Perception: Within Functional Limits Praxis Praxis: Intact Cognition Overall Cognitive Status: Within Functional Limits for tasks assessed Arousal/Alertness: Awake/alert Orientation Level: Oriented X4 Sensation Sensation Light Touch: Appears Intact Stereognosis: Appears Intact Hot/Cold: Appears Intact Proprioception: Appears Intact Coordination Gross Motor Movements are Fluid and Coordinated: Yes Fine Motor Movements are Fluid and Coordinated: Yes Heel Shin Test: [facility] Motor  Motor Motor: Within  Functional Limits Mobility  Bed Mobility Bed Mobility: Rolling Right;Rolling Left;Sit to Supine;Supine to Sit Rolling Right: 6: Modified independent (Device/Increase time) Rolling Left: 6: Modified independent (Device/Increase time) Supine to Sit: 6: Modified independent (Device/Increase time) Sit to Supine: 6: Modified independent (Device/Increase time) Transfers Sit to Stand: 6: Modified independent (Device/Increase time)   Balance Standardized Balance Assessment Static Sitting Balance Static Sitting - Level of Assistance: 6: Modified independent (Device/Increase time) Dynamic Sitting Balance Dynamic Sitting - Level of Assistance: 6: Modified independent (Device/Increase time) Static Standing Balance Static Standing - Balance Support: During functional activity Static Standing - Level of Assistance: 6: Modified independent (Device/Increase time) Dynamic Standing Balance Dynamic Standing - Balance Support: During functional activity Dynamic Standing - Level of Assistance: 6: Modified independent (Device/Increase time) Extremity/Trunk Assessment RUE Assessment RUE Assessment: Within Functional Limits LUE Assessment LUE Assessment: Within Functional Limits   See Function Navigator for Current Functional Status.  Daneen Schick Judye Lorino 05/12/2017, 3:32 PM

## 2017-05-12 NOTE — Progress Notes (Signed)
Occupational Therapy Session Note  Patient Details  Name: [name] [name] MRN: [id number] Date of Birth: 07-08-1950  Today's Date: 05/12/2017  Session 1 OT Individual Time: 0900-1011 OT Individual Time Calculation (min): 71 min   Session 2 OT Individual Time: 1435-1530 OT Individual Time Calculation (min): 55 min   Short Term Goals: Week 1:  OT Short Term Goal 1 (Week 1): STGs equal to LTGs set at modified independent level.   Skilled Therapeutic Interventions/Progress Updates:  Session 1   OT treatment session focused on increased independence with modified bathing/dressing tasks. Pt ambulatory in room Mod I to collect clothing. Pt then showered in walk-in shower standing up without LOB and Mod I. Dressing completed after shower with increased time and mod I. Dynamic balance and activity tolerance with grooming tasks at the sink. Discussed dc plan, home safety awareness, and modifications for safe BADL participation at home. Pt then ambulated 300 ft to [employer]	[place], took seated rest break, and returned to room. No LOB within ambulation activity. Pt left seated EOB at end of session with spouse present and needs met.   Session 2 Pt greeted semi-reclined in bed and agreeable to OT. Pt needed increased time to don and tie shoes, but able to complete without assit. Pt ambulated to therapy gym mod I and worked on R hand fine motor control and strengthening with peg board activity and red thera-putty exercises. Pt given handouts for home fine motor program and thera-putty exercises (with pictures). Focus on grip, pinch, finger isolation, translation, and rotation within fine-motor tasks. B UE and hand eye coordination with ball toss focused on chest pass, and overhead pass. Pt ambulated back to room at end of session and left seated at EOB with spouse present and needs met.    Therapy Documentation Precautions:  Precautions Precautions: Fall Precaution Comments: watch HR- elevates quickly with  mobility activities Restrictions Weight Bearing Restrictions: No Pain: None/denies pain See Function Navigator for Current Functional Status.   Therapy/Group: Individual Therapy  Valma Cava 05/12/2017, 3:32 PM

## 2017-05-12 NOTE — Progress Notes (Signed)
Physical Therapy Discharge Summary  Patient Details  Name: [name] [name] MRN: [id number] Date of Birth: May 25, 1950  Today's Date: 05/12/2017 PT Individual Time: 1105-1200 PT Individual Time Calculation (min): 55 min    Patient has met 9 of 9 long term goals due to improved activity tolerance, improved balance, increased strength and improved coordination.  Patient to discharge at an ambulatory level Supervision.   Patient's care partner is independent to provide the necessary physical assistance at discharge.  Reasons goals not met: All PT goals met   Recommendation:  Patient will benefit from ongoing skilled PT services in home health setting to continue to advance safe functional mobility, address ongoing impairments in balance, safety, community mobility, and minimize fall risk.  Equipment: No equipment provided  Reasons for discharge: treatment goals met and discharge from hospital  Patient/family agrees with progress made and goals achieved: Yes   PT treatment   Pt received supine in bed and agreeable to PT. Supine>sit transfer without assist. PT instructed pt in Grad day assessment to measure progress toward goals. See below for details. Patient returned to room and left sitting in [facility] with call bell in reach and all needs met.      PT Discharge Vital Signs Therapy Vitals Pulse Rate: (!) 109 Oxygen Therapy SpO2: 99 % O2 Device: Room Air Pain Pain Assessment Pain Scale: 0-10 Pain Score: 0-No pain Vision/Perception  Perception Perception: Within Functional Limits Praxis Praxis: Intact  Cognition Overall Cognitive Status: Within Functional Limits for tasks assessed Arousal/Alertness: Awake/alert Orientation Level: Oriented X4 Sensation Sensation Light Touch: Appears Intact Stereognosis: Appears Intact Hot/Cold: Appears Intact Proprioception: Appears Intact Coordination Gross Motor Movements are Fluid and Coordinated: Yes Fine Motor Movements are Fluid and  Coordinated: Yes Heel Shin Test: [facility] Motor  Motor Motor: Within Functional Limits  Mobility Bed Mobility Bed Mobility: Rolling Right;Rolling Left;Sit to Supine;Supine to Sit Rolling Right: 6: Modified independent (Device/Increase time) Rolling Left: 6: Modified independent (Device/Increase time) Supine to Sit: 6: Modified independent (Device/Increase time) Sit to Supine: 6: Modified independent (Device/Increase time) Transfers Transfers: Yes Sit to Stand: 6: Modified independent (Device/Increase time) Stand Pivot Transfers: 6: Modified independent (Device/Increase time)(car trasnfer with supervision assist and min cues for safety. ) Locomotion  Ambulation Ambulation: Yes Ambulation/Gait Assistance: 5: Supervision Ambulation Distance (Feet): 150 Feet Gait Gait: Yes Gait Pattern: Impaired Gait Pattern: Decreased step length - left;Decreased stance time - right Stairs / Additional Locomotion Stairs: Yes Stairs Assistance: 5: Supervision Stair Management Technique: One rail Right Number of Stairs: 12 Height of Stairs: 6 Ramp: 5: Supervision Wheelchair Mobility Wheelchair Mobility: No  Trunk/Postural Assessment     WFL.  Balance Standardized Balance Assessment Standardized Balance Assessment: Berg Balance Test;Timed Up and Go Test Berg Balance Test Sit to Stand: Able to stand without using hands and stabilize independently Standing Unsupported: Able to stand safely 2 minutes Sitting with Back Unsupported but Feet Supported on Floor or Stool: Able to sit safely and securely 2 minutes Stand to Sit: Sits safely with minimal use of hands Transfers: Able to transfer safely, minor use of hands Standing Unsupported with Eyes Closed: Able to stand 10 seconds safely Standing Ubsupported with Feet Together: Able to place feet together independently and stand 1 minute safely From Standing, Reach Forward with Outstretched Arm: Can reach confidently >25 cm (10") From Standing Position,  Pick up Object from Floor: Able to pick up shoe safely and easily From Standing Position, Turn to Look Behind Over each Shoulder: Looks behind from both sides  and weight shifts well Turn 360 Degrees: Able to turn 360 degrees safely one side only in 4 seconds or less Standing Unsupported, Alternately Place Feet on Step/Stool: Able to stand independently and complete 8 steps >20 seconds Standing Unsupported, One Foot in Front: Able to plae foot ahead of the other independently and hold 30 seconds Standing on One Leg: Able to lift leg independently and hold equal to or more than 3 seconds Total Score: 51 Timed Up and Go Test TUG: Normal TUG Normal TUG (seconds): 12 Static Sitting Balance Static Sitting - Level of Assistance: 6: Modified independent (Device/Increase time) Dynamic Sitting Balance Dynamic Sitting - Level of Assistance: 6: Modified independent (Device/Increase time) Static Standing Balance Static Standing - Level of Assistance: 6: Modified independent (Device/Increase time) Dynamic Standing Balance Dynamic Standing - Level of Assistance: 5: Stand by assistance Extremity Assessment      RLE Assessment RLE Assessment: Within Functional Limits(4+/5 to 5/5 throughout) LLE Assessment LLE Assessment: Within Functional Limits(4+/5 to 5/5 throughout)   See Function Navigator for Current Functional Status.  Lorie Phenix 05/12/2017, 11:46 AM

## 2017-05-13 LAB — CBC
HCT: 29.8 % — ABNORMAL LOW (ref 36.0–46.0)
Hemoglobin: 9.6 g/dL — ABNORMAL LOW (ref 12.0–15.0)
MCH: 25.6 pg — AB (ref 26.0–34.0)
MCHC: 32.2 g/dL (ref 30.0–36.0)
MCV: 79.5 fL (ref 78.0–100.0)
PLATELETS: 732 10*3/uL — AB (ref 150–400)
RBC: 3.75 MIL/uL — AB (ref 3.87–5.11)
RDW: 16.9 % — ABNORMAL HIGH (ref 11.5–15.5)
WBC: 8.7 10*3/uL (ref 4.0–10.5)

## 2017-05-13 NOTE — Progress Notes (Signed)
Patient discharged to home, accompanined  By her husband.

## 2017-05-15 ENCOUNTER — Telehealth: Payer: Self-pay | Admitting: Registered Nurse

## 2017-05-15 NOTE — Telephone Encounter (Signed)
Transitional Care call Transitional Care Call Completed, Appointment Confirmed, Address Confirmed, New Patient Packet Mailed  Transitional Care Call Questions answered by Mr. Achey ( Husband)  Patient name: [name] [name] DOB: Jul 22, 1950 1. Are you/is patient experiencing any problems since coming home? No a. Are there any questions regarding any aspect of care? No 2. Are there any questions regarding medications administration/dosing? No a. Are meds being taken as prescribed? Yes b. "Patient should review meds with caller to confirm" Medication List Reviewed 3. Have there been any falls? No 4. Has Home Health been to the house and/or have they contacted you? Mr. Abrams states his wife [name] cancelled [facility]. a. If not, have you tried to contact them? NA b. Can we help you contact them? NA 5. Are bowels and bladder emptying properly? Yes a. Are there any unexpected incontinence issues? No b. If applicable, is patient following bowel/bladder programs? NA 6. Any fevers, problems with breathing, unexpected pain? No 7. Are there any skin problems or new areas of breakdown? No 8. Has the patient/family member arranged specialty MD follow up (ie cardiology/neurology/renal/surgical/etc.)?  Yes, he will calling Dr. Erlinda Hong today he states.  a. Can we help arrange? NA 9. Does the patient need any other services or support that we can help arrange? No 10. Are caregivers following through as expected in assisting the patient? Yes 11. Has the patient quit smoking, drinking alcohol, or using drugs as recommended? Mr. Heath states his wife [name] doesn't smoke, drink alcohol or use illicit drugs.  Appointment date/time 05/24/2017 arrival time 11:00 for 11:20 appointment with Dr. Posey Pronto at ,[place]

## 2017-05-18 ENCOUNTER — Encounter: Payer: Self-pay | Admitting: Internal Medicine

## 2017-05-18 ENCOUNTER — Telehealth: Payer: Self-pay | Admitting: Hematology

## 2017-05-18 ENCOUNTER — Ambulatory Visit (INDEPENDENT_AMBULATORY_CARE_PROVIDER_SITE_OTHER): Payer: Medicare Other | Admitting: Internal Medicine

## 2017-05-18 ENCOUNTER — Other Ambulatory Visit (INDEPENDENT_AMBULATORY_CARE_PROVIDER_SITE_OTHER): Payer: Medicare Other

## 2017-05-18 VITALS — BP 114/78 | HR 107 | Temp 98.4°F | Ht 70.0 in | Wt 186.0 lb

## 2017-05-18 DIAGNOSIS — I639 Cerebral infarction, unspecified: Secondary | ICD-10-CM

## 2017-05-18 DIAGNOSIS — R180 Malignant ascites: Secondary | ICD-10-CM | POA: Diagnosis not present

## 2017-05-18 DIAGNOSIS — D62 Acute posthemorrhagic anemia: Secondary | ICD-10-CM

## 2017-05-18 DIAGNOSIS — C799 Secondary malignant neoplasm of unspecified site: Secondary | ICD-10-CM | POA: Diagnosis not present

## 2017-05-18 DIAGNOSIS — R339 Retention of urine, unspecified: Secondary | ICD-10-CM

## 2017-05-18 LAB — CBC WITH DIFFERENTIAL/PLATELET
BASOS ABS: 0.1 10*3/uL (ref 0.0–0.1)
Basophils Relative: 1.2 % (ref 0.0–3.0)
EOS ABS: 0 10*3/uL (ref 0.0–0.7)
Eosinophils Relative: 0.2 % (ref 0.0–5.0)
HEMATOCRIT: 33 % — AB (ref 36.0–46.0)
Hemoglobin: 10.9 g/dL — ABNORMAL LOW (ref 12.0–15.0)
LYMPHS PCT: 6.2 % — AB (ref 12.0–46.0)
Lymphs Abs: 0.7 10*3/uL (ref 0.7–4.0)
MCHC: 33.1 g/dL (ref 30.0–36.0)
MCV: 80.3 fl (ref 78.0–100.0)
MONOS PCT: 9.2 % (ref 3.0–12.0)
Monocytes Absolute: 1 10*3/uL (ref 0.1–1.0)
Neutro Abs: 9.3 10*3/uL — ABNORMAL HIGH (ref 1.4–7.7)
Neutrophils Relative %: 83.2 % — ABNORMAL HIGH (ref 43.0–77.0)
Platelets: 884 10*3/uL — ABNORMAL HIGH (ref 150.0–400.0)
RBC: 4.11 Mil/uL (ref 3.87–5.11)
RDW: 17.8 % — ABNORMAL HIGH (ref 11.5–15.5)
WBC: 11.2 10*3/uL — AB (ref 4.0–10.5)

## 2017-05-18 LAB — BASIC METABOLIC PANEL
BUN: 18 mg/dL (ref 6–23)
CALCIUM: 9.2 mg/dL (ref 8.4–10.5)
CHLORIDE: 94 meq/L — AB (ref 96–112)
CO2: 26 mEq/L (ref 19–32)
CREATININE: 1.34 mg/dL — AB (ref 0.40–1.20)
GFR: 51.11 mL/min — AB (ref >60.00)
Glucose, Bld: 113 mg/dL — ABNORMAL HIGH (ref 70–99)
Potassium: 4.7 mEq/L (ref 3.5–5.1)
Sodium: 126 mEq/L — ABNORMAL LOW (ref 135–145)

## 2017-05-18 LAB — PROTIME-INR
INR: 2.7 ratio — AB (ref 0.8–1.0)
Prothrombin Time: 29.1 s — ABNORMAL HIGH (ref 9.6–13.1)

## 2017-05-18 LAB — HEPATIC FUNCTION PANEL
ALBUMIN: 2.5 g/dL — AB (ref 3.5–5.2)
ALK PHOS: 60 U/L (ref 39–117)
ALT: 7 U/L (ref 0–35)
AST: 16 U/L (ref 0–37)
BILIRUBIN DIRECT: 0.2 mg/dL (ref 0.0–0.3)
TOTAL PROTEIN: 6.9 g/dL (ref 6.0–8.3)
Total Bilirubin: 0.5 mg/dL (ref 0.2–1.2)

## 2017-05-18 MED ORDER — TAMSULOSIN HCL 0.4 MG PO CAPS: 0.4000 mg | ORAL_CAPSULE | Freq: Every day | ORAL | 3 refills | Status: DC

## 2017-05-18 NOTE — Progress Notes (Signed)
Subjective:    Patient ID: [name] [name], female    DOB: 1950-10-01, 67 y.o.   MRN: [id number]  HPI  Here to f/u recent hospn; 67 year old right-handed female with history of hypertension and schizophrenia who presented May 04, 2017 with blurred vision, right-sided weakness and decreased appetite.  She lives with her husband, independent prior to admission.  Cranial CT scan showed small bilateral occipital to early subacute infarctions without hemorrhage or mass effect.  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  A CT of the chest positive for large burden of what appeared to be chronic thrombus and acute pulmonary emboli in the lungs bilaterally left greater than right as well as small pulmonary nodules scattered throughout the lungs bilaterally concerning for metastatic lesions. Also noted large volume of ascites in the abdomen likely malignant given the apparent omental nodularity and underwent paracentesis May 06, 2017 with 3.8 L of fluid removed.  Vascular studies of lower extremities positive for DVT, right lower extremity in a femoral vein posterior tibial veins.  MRI of the brain demonstrated multiple additional subcentimeter foci of late subacute to chronic infarcts scattered throughout the supratentorial and infratentorial brain with few additional punctate foci and left frontal, right parietal and left thalamus.  Placed on intravenous heparin for pulmonary emboli, planning long-term anticoagulation.  TCD study negative for PFO.  The patient had declined any further workup for metastatic carcinoma and consultation obtained with palliative care.  Physical and occupational therapy ongoing.  The patient was admitted for comprehensive rehab program.from 3/19-3/22, now at home.  Denies urinary symptoms such as dysuria, frequency, urgency, flank pain, hematuria or n/v, fever, chills, but now has urinary retention like symptoms, hard to  get started with urination.  Also ascites appears to have recurred and now abd is near tense again per husband.  No other new complaints or interval hx  Has appt for outpt rehab on apr 4, also neurology in Jun 26, 2017  Denies urinary symptoms such as dysuria, frequency, urgency, flank pain, hematuria or n/v, fever, chills.m, though still ahs some urinary retention feeling, hard to get started.  Pain control adequate on current pain med, now about 4/10 - tolerable to her. Last paracentesis about mar 17, with cytology + for malignant cells  - pt states not clear on this and was reinforced to her today.  Currently, walks room to room at home (BR to bed)  No other new complaints Past Medical History:  Diagnosis Date  . CTS (carpal tunnel syndrome)    bilateral  . FATIGUE 08/27/2008  . GLUCOSE INTOLERANCE 06/25/2008  . HYPERLIPIDEMIA 06/25/2008  . HYPERTENSION 06/25/2008  . Impaired glucose tolerance 06/12/2010  . SCHIZOPHRENIA 06/25/2008   Past Surgical History:  Procedure Laterality Date  . DILATION AND CURETTAGE OF UTERUS  1999   hx of after miscarriage  . s/p cervical procedure  1995   LEEP    reports that she has quit smoking. She quit smokeless tobacco use about 41 years ago. She reports that she does not drink alcohol or use drugs. family history includes Hypertension in her other; Stroke in her other. Allergies  Allergen Reactions  . Nabumetone Rash   Current Outpatient Medications on File Prior to Visit  Medication Sig Dispense Refill  . aspirin EC 81 MG EC tablet Take 1 tablet (81 mg total) by mouth daily.    . benztropine (COGENTIN) 2 MG tablet Take 1 tablet (2 mg total)  by mouth daily. 90 tablet 3  . escitalopram (LEXAPRO) 5 MG tablet Take 1 tablet (5 mg total) by mouth daily. 30 tablet 0  . haloperidol (HALDOL) 10 MG tablet Take 1.5 tablets (15 mg total) by mouth daily. Take 1 1/2 tab by mouth daily 60 tablet 0  . lovastatin (MEVACOR) 40 MG tablet Take 1 tablet (40 mg total) by mouth  daily. 90 tablet 3  . Rivaroxaban (XARELTO) 15 MG TABS tablet 15 mg twice daily through 05/30/2017 and stop 42 tablet 0  . rivaroxaban (XARELTO) 20 MG TABS tablet 20 mg daily beginning 05/31/2017 30 tablet 1  . traMADol (ULTRAM) 50 MG tablet Take 1-2 tablets (50-100 mg total) by mouth every 6 (six) hours as needed for moderate pain. 30 tablet 0  . vitamin B-12 1000 MCG tablet Take 1 tablet (1,000 mcg total) by mouth daily. 30 tablet 0   No current facility-administered medications on file prior to visit.    Review of Systems  Constitutional: Negative for other unusual diaphoresis or sweats HENT: Negative for ear discharge or swelling Eyes: Negative for other worsening visual disturbances Respiratory: Negative for stridor or other swelling  Gastrointestinal: Negative for worsening distension or other blood Genitourinary: Negative for retention or other urinary change Musculoskeletal: Negative for other MSK pain or swelling Skin: Negative for color change or other new lesions Neurological: Negative for worsening tremors and other numbness  Psychiatric/Behavioral: Negative for worsening agitation or other fatigue All other system neg per pt    Objective:   Physical Exam BP 114/78   Pulse (!) 107   Temp 98.4 F (36.9 C) (Oral)   Ht 5\' 10"  (1.778 m)   Wt 186 lb (84.4 kg)   SpO2 98%   BMI 26.69 kg/m  VS noted,  Constitutional: Pt appears in NAD HENT: Head: NCAT.  Right Ear: External ear normal.  Left Ear: External ear normal.  Eyes: . Pupils are equal, round, and reactive to light. Conjunctivae and EOM are normal Nose: without d/c or deformity Neck: Neck supple. Gross normal ROM Cardiovascular: Normal rate and regular rhythm.   Pulmonary/Chest: Effort normal and breath sounds without rales or wheezing.  Abd:  Soft, NT, ND, + BS, no organomegaly but near tense ascites Neurological: Pt is alert. At baseline orientation, motor grossly intact Skin: Skin is warm. No rashes, other new  lesions, no LE edema Psychiatric: Pt behavior is normal without agitation  No other exam findings  Lab Results  Component Value Date   WBC 8.7 05/13/2017   HGB 9.6 (L) 05/13/2017   HCT 29.8 (L) 05/13/2017   PLT 732 (H) 05/13/2017   GLUCOSE 132 (H) 05/10/2017   CHOL 137 05/04/2017   TRIG 139 05/04/2017   HDL 38 (L) 05/04/2017   LDLCALC 71 05/04/2017   ALT 12 (L) 05/09/2017   AST 28 05/09/2017   NA 124 (L) 05/10/2017   K 3.7 05/10/2017   CL 95 (L) 05/10/2017   CREATININE 0.95 05/10/2017   BUN 7 05/10/2017   CO2 21 (L) 05/10/2017   TSH 3.91 09/26/2016   INR 1.32 05/03/2017   HGBA1C 6.2 (H) 05/04/2017       Assessment & Plan:

## 2017-05-18 NOTE — Telephone Encounter (Signed)
Appt has been scheduled for the pt to see Dr. Irene Limbo on 4/1 at 2pm. Appt date and time has been given to the pt's husband. Aware to arrive 30 minutes early.

## 2017-05-18 NOTE — Patient Instructions (Signed)
Please take all new medication as prescribed - the flomax for urination  You will be contacted regarding the referral for: Oncology (cancer doctor) and Paracentesis (fluid off the stomach)  Please continue all other medications as before, and refills have been done if requested.  Please have the pharmacy call with any other refills you may need.  Please keep your appointments with your specialists as you may have planned  Please go to the LAB in the Basement (turn left off the elevator) for the tests to be done today  You will be contacted by phone if any changes need to be made immediately.  Otherwise, you will receive a letter about your results with an explanation, but please check with MyChart first.  Please remember to sign up for MyChart if you have not done so, as this will be important to you in the future with finding out test results, communicating by private email, and scheduling acute appointments online when needed.  Please return in 1 months, or sooner if needed

## 2017-05-20 ENCOUNTER — Encounter: Payer: Self-pay | Admitting: Internal Medicine

## 2017-05-20 NOTE — Assessment & Plan Note (Signed)
Doubt med related, for ua and cx, also empiric flomax asd

## 2017-05-20 NOTE — Assessment & Plan Note (Signed)
No overt bleeding, for f/u lab today

## 2017-05-20 NOTE — Assessment & Plan Note (Addendum)
Will try to arrange for repeat therapeutic paracentesis  Note:  Total time for pt hx, exam, review of record with pt in the room, determination of diagnoses and plan for further eval and tx is > 40 min, with over 50% spent in coordination and counseling of patient including the differential dx, tx, further evaluation and other management of malignant ascites, metastatic cancer, urinary retention and ABL anemia

## 2017-05-20 NOTE — Assessment & Plan Note (Signed)
D/w pt and husband, ok for referral oncology

## 2017-05-21 ENCOUNTER — Ambulatory Visit (HOSPITAL_COMMUNITY)
Admission: RE | Admit: 2017-05-21 | Discharge: 2017-05-21 | Disposition: A | Discharge status: Home / Self Care | Payer: Medicare Other | Source: Ambulatory Visit | Attending: Internal Medicine | Admitting: Internal Medicine

## 2017-05-21 ENCOUNTER — Ambulatory Visit: Payer: Medicare Other | Admitting: Hematology

## 2017-05-21 DIAGNOSIS — C801 Malignant (primary) neoplasm, unspecified: Secondary | ICD-10-CM | POA: Diagnosis not present

## 2017-05-21 DIAGNOSIS — R180 Malignant ascites: Secondary | ICD-10-CM | POA: Diagnosis not present

## 2017-05-21 DIAGNOSIS — C799 Secondary malignant neoplasm of unspecified site: Secondary | ICD-10-CM | POA: Insufficient documentation

## 2017-05-21 NOTE — Procedures (Signed)
Ultrasound-guided  therapeutic paracentesis performed yielding 4.8 liters of hazy, yellow fluid. No immediate complications.  

## 2017-05-24 ENCOUNTER — Encounter: Payer: Self-pay | Admitting: Physical Medicine & Rehabilitation

## 2017-05-24 ENCOUNTER — Encounter: Payer: Medicare Other | Attending: Physical Medicine & Rehabilitation | Admitting: Physical Medicine & Rehabilitation

## 2017-05-24 VITALS — BP 105/71 | HR 110 | Ht 68.0 in | Wt 180.0 lb

## 2017-05-24 DIAGNOSIS — F209 Schizophrenia, unspecified: Secondary | ICD-10-CM | POA: Insufficient documentation

## 2017-05-24 DIAGNOSIS — I2699 Other pulmonary embolism without acute cor pulmonale: Secondary | ICD-10-CM | POA: Insufficient documentation

## 2017-05-24 DIAGNOSIS — E871 Hypo-osmolality and hyponatremia: Secondary | ICD-10-CM | POA: Diagnosis not present

## 2017-05-24 DIAGNOSIS — E785 Hyperlipidemia, unspecified: Secondary | ICD-10-CM | POA: Diagnosis not present

## 2017-05-24 DIAGNOSIS — I639 Cerebral infarction, unspecified: Secondary | ICD-10-CM

## 2017-05-24 DIAGNOSIS — Z87891 Personal history of nicotine dependence: Secondary | ICD-10-CM | POA: Diagnosis not present

## 2017-05-24 DIAGNOSIS — I69951 Hemiplegia and hemiparesis following unspecified cerebrovascular disease affecting right dominant side: Secondary | ICD-10-CM | POA: Diagnosis not present

## 2017-05-24 DIAGNOSIS — I2602 Saddle embolus of pulmonary artery with acute cor pulmonale: Secondary | ICD-10-CM | POA: Diagnosis not present

## 2017-05-24 DIAGNOSIS — R180 Malignant ascites: Secondary | ICD-10-CM | POA: Insufficient documentation

## 2017-05-24 DIAGNOSIS — C799 Secondary malignant neoplasm of unspecified site: Secondary | ICD-10-CM | POA: Insufficient documentation

## 2017-05-24 DIAGNOSIS — I10 Essential (primary) hypertension: Secondary | ICD-10-CM | POA: Insufficient documentation

## 2017-05-24 DIAGNOSIS — R269 Unspecified abnormalities of gait and mobility: Secondary | ICD-10-CM | POA: Insufficient documentation

## 2017-05-24 NOTE — Progress Notes (Signed)
Subjective:    Patient ID: [name] [name], female    DOB: [birth year], 67 y.o.   MRN: [id number]  HPI 67 year old right-handed female with history of hypertension and schizophrenia presents for transitional care management after receiving CIR for right posterior parietal and left occipital infarcts consistent with systemic emboli.  DATE OF ADMISSION:  05/08/2017 DATE OF DISCHARGE: 05/13/2017  At discharge, she was instructed to follow up with Neurology, with whom she has an appointment.  She sees PCP in 1 month.  She continues to take Xarelto. She has a paracentesis since discharge, notes reviewed. She is urinating without difficulty.  She has not had her labs drawn.  She does not have appointment with Heme/Onc.    Therapies: None Mobility: Wheelchair during long distance DME: None  Pain Inventory Average Pain 5 Pain Right Now 0 My pain is stabbing  In the last 24 hours, has pain interfered with the following? General activity 0 Relation with others 0 Enjoyment of life 0 What TIME of day is your pain at its worst? na Sleep (in general) Fair  Pain is worse with: na Pain improves with: na Relief from Meds: na  Mobility Do you have any goals in this area?  yes  Function Do you have any goals in this area?  yes  Neuro/Psych No problems in this area  Prior Studies Any changes since last visit?  no  Physicians involved in your care Any changes since last visit?  no   Family History  Problem Relation Age of Onset  . Hypertension Other   . Stroke Other    Social History   Socioeconomic History  . Marital status: Married    Spouse name: Not on file  . Number of children: 2  . Years of education: Not on file  . Highest education level: Not on file  Occupational History  . Occupation: disabled psych  Social Needs  . Financial resource strain: Not on file  . Food insecurity:    Worry: Not on file    Inability: Not on file  . Transportation needs:    Medical:  Not on file    Non-medical: Not on file  Tobacco Use  . Smoking status: Former Research scientist (life sciences)  . Smokeless tobacco: Former Systems developer    Quit date: 02/21/1976  . Tobacco comment: quit a long time ago;   Substance and Sexual Activity  . Alcohol use: No    Alcohol/week: 0.0 oz  . Drug use: No  . Sexual activity: Not on file  Lifestyle  . Physical activity:    Days per week: Not on file    Minutes per session: Not on file  . Stress: Not on file  Relationships  . Social connections:    Talks on phone: Not on file    Gets together: Not on file    Attends religious service: Not on file    Active member of club or organization: Not on file    Attends meetings of clubs or organizations: Not on file    Relationship status: Not on file  Other Topics Concern  . Not on file  Social History Narrative  . Not on file   Past Surgical History:  Procedure Laterality Date  . DILATION AND CURETTAGE OF UTERUS  1999   hx of after miscarriage  . s/p cervical procedure  1995   LEEP   Past Medical History:  Diagnosis Date  . CTS (carpal tunnel syndrome)    bilateral  . FATIGUE 08/27/2008  .  GLUCOSE INTOLERANCE 06/25/2008  . HYPERLIPIDEMIA 06/25/2008  . HYPERTENSION 06/25/2008  . Impaired glucose tolerance 06/12/2010  . SCHIZOPHRENIA 06/25/2008   BP 105/71   Pulse (!) 110   Ht 5\' 8"  (1.727 m) Comment: states  Wt 180 lb (81.6 kg) Comment: sates  SpO2 95%   BMI 27.37 kg/m   Opioid Risk Score:   Fall Risk Score:  `1  Depression screen PHQ 2/9  Depression screen [facility] 2/9 09/26/2016 04/27/2015 04/27/2015 04/24/2014 02/12/2013  Decreased Interest 0 0 0 0 0  Down, Depressed, Hopeless 0 0 0 1 0  PHQ - 2 Score 0 0 0 1 0   Review of Systems  Constitutional: Negative.   HENT: Negative.   Eyes: Negative.   Respiratory: Negative.   Cardiovascular: Negative.   Gastrointestinal: Negative.   Endocrine: Negative.   Genitourinary: Negative.   Musculoskeletal: Negative.   Skin: Negative.   Allergic/Immunologic:  Negative.   Neurological: Negative.   Hematological: Negative.   Psychiatric/Behavioral: Negative.   All other systems reviewed and are negative.     Objective:   Physical Exam Constitutional: She appears well-nourished. No distress.  HENT: Normocephalic. Atraumatic. Eyes: EOM are normal. No discharge.  Cardiovascular: +Tachycardia. Normal rhythm. No JVD. Respiratory: Effort normal and breath sounds normal.  GI: Bowel sounds are normal. +Distension.  Musculoskeletal: LE edema. No tenderness. Neurological: Alert Slow to initiate Follow simple commands.  Processing delays.  Motor: 4+/5 grossly throughout (left stronger than right) Skin. Warm and dry.  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  1. 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  Bilateral pulmonary emboli left greater than right and right lower extremity femoral vein, posterior tibial vein DVT  Cont Xarelto  3.Metastatic carcinoma.   Family now interested in Chemotherapy  Will refer to Heme/Onc  4.Ascites, malignant.   Status post paracentesis 05/21/2017  5. Hyponatremia  Follow up labs per Heme/Onc vs PCP  6. Gait abnormality  Cont wheelchair for long distances  Wheelchair ordered

## 2017-05-28 ENCOUNTER — Other Ambulatory Visit: Payer: Self-pay | Admitting: Internal Medicine

## 2017-05-28 ENCOUNTER — Telehealth: Payer: Self-pay | Admitting: *Deleted

## 2017-05-28 ENCOUNTER — Other Ambulatory Visit: Payer: Self-pay

## 2017-05-28 MED ORDER — TRAMADOL HCL 50 MG PO TABS: 50.0000 mg | ORAL_TABLET | Freq: Four times a day (QID) | ORAL | 1 refills | Status: AC | PRN

## 2017-05-28 MED ORDER — TRAMADOL HCL 50 MG PO TABS: 50.0000 mg | ORAL_TABLET | Freq: Four times a day (QID) | ORAL | 1 refills | Status: DC | PRN

## 2017-05-28 NOTE — Telephone Encounter (Signed)
Pt called and left message requesting refill of pain meds.  Noted pt has appt with Dr. Lebron Conners as  New Patient for 06/01/17.  Pt has not been seen by the clinic yet. Attempted to call pt back unsuccessfully.   Unable to leave message due to phone has busy signal  X 2 different times. Pt's    Phone      [phone].

## 2017-05-28 NOTE — Telephone Encounter (Signed)
Copied from [place] [id number]. Topic: Quick Communication - Rx Refill/Question >> May 28, 2017  1:01 PM [place], [place] A wrote: Medication: traMADol (ULTRAM) 50 MG tablet [[id number]]  - pt rec'd this med in the hosp. She would like DR Jenny Reichmann to fill it.  She stated she had a hosp fu with Dr Jenny Reichmann on 3/29 Has the patient contacted their pharmacy? no (Agent: If no, request that the patient contact the pharmacy for the refill.) Preferred Pharmacy (with phone number or street name): [facility] Agent: Please be advised that RX refills may take up to 3 business days. We ask that you follow-up with your pharmacy.

## 2017-05-28 NOTE — Telephone Encounter (Signed)
LOV 05/18/17 Dr. Clement Sayres

## 2017-05-28 NOTE — Telephone Encounter (Signed)
Patient calling to check the status of her medication refill. I advised patient that there is a 72hr turn around time for all medication refills

## 2017-05-28 NOTE — Telephone Encounter (Signed)
Spoke with [name] [name] and instructed [name] [name] to contact her PCP for pain med refill.  Explained to [name] [name] that she has appt as a New Patient with Dr. Lebron Conners on 06/01/17.  [name] [name] voiced understanding.

## 2017-05-28 NOTE — Telephone Encounter (Signed)
Done erx 

## 2017-05-29 ENCOUNTER — Other Ambulatory Visit: Payer: Self-pay

## 2017-05-29 NOTE — Patient Outreach (Signed)
[facility]) Care Management  05/29/2017  [name] [name] May 05, 1950 [id number]   EMMI: Stroke red alert Referral date: 05/28/17 Referral reason: problem refilling medication Day # 13   Telephone call to patient regarding EMMI stroke red alert. HIPAA verified with patient. Explained reason for call. Patient states she is not having any problems with her prescriptions now. Patient states she needed her pain management prescription. She reports her primary MD sent in the prescription to her pharmacy and her husband will be picking it up today. Patient denies having any new symptoms or concerns. Patient states she saw her primary MD on 05/20/17 after being discharged from the hospital.  [facility] advised patient to continue to take her medications as prescribed. RNCM advised patient to keep follow up appointments. Advised patient on how to contact her doctor after hours for needs.  RNCM advised patient to notify MD of any changes in condition prior to scheduled appointment. RNCM verified patient aware of 911 services for urgent/ emergent needs.  PLAN; RNCM will close patient due to patient being assessed and having no further needs.   Quinn Plowman RN,BSN,CCM [facility] Telephonic  [phone]

## 2017-05-29 NOTE — Patient Outreach (Signed)
[place] [facility]) Care Management  05/29/2017  [name] [name] [birth year] [id number]  Late entry for 05/28/17  EMMI: Stroke red alert Referral date: 05/28/17 Referral reason: problem refilling medication Day # 13 Attempt #1  Telephone call to patient regarding EMMI stroke red alert. Unable to reach patient. HIPAA compliant voice message left with call back phone number.Marland Kitchen    PLAN: RNCM will attempt 2nd telephone call to patient within 4 business days.   Quinn Plowman RN,BSN,CCM [facility] Telephonic  [phone]

## 2017-05-31 DIAGNOSIS — H2513 Age-related nuclear cataract, bilateral: Secondary | ICD-10-CM | POA: Diagnosis not present

## 2017-05-31 DIAGNOSIS — H04123 Dry eye syndrome of bilateral lacrimal glands: Secondary | ICD-10-CM | POA: Diagnosis not present

## 2017-05-31 DIAGNOSIS — H5709 Other anomalies of pupillary function: Secondary | ICD-10-CM | POA: Diagnosis not present

## 2017-05-31 DIAGNOSIS — H3581 Retinal edema: Secondary | ICD-10-CM | POA: Diagnosis not present

## 2017-06-01 ENCOUNTER — Ambulatory Visit: Payer: Medicare Other | Admitting: Hematology and Oncology

## 2017-06-04 ENCOUNTER — Telehealth: Payer: Self-pay | Admitting: *Deleted

## 2017-06-04 NOTE — Telephone Encounter (Signed)
"  [name] [name] trying to call for [name] who had a procedure Friday with Dr. Lebron Conners at [facility] radiology.  Call me [phone]."  No further information with this message.

## 2017-06-05 ENCOUNTER — Telehealth: Payer: Self-pay | Admitting: Hematology and Oncology

## 2017-06-05 NOTE — Telephone Encounter (Signed)
Appts rescheduled per 4/12 sch msg

## 2017-06-13 ENCOUNTER — Inpatient Hospital Stay (HOSPITAL_COMMUNITY)
Admission: EM | Admit: 2017-06-13 | Discharge: 2017-06-15 | DRG: 683 | Disposition: A | Discharge status: Hospice | Payer: Medicare Other | Attending: Internal Medicine | Admitting: Internal Medicine

## 2017-06-13 ENCOUNTER — Telehealth: Payer: Self-pay | Admitting: Internal Medicine

## 2017-06-13 ENCOUNTER — Inpatient Hospital Stay (HOSPITAL_COMMUNITY): Payer: Medicare Other

## 2017-06-13 ENCOUNTER — Encounter (HOSPITAL_COMMUNITY): Payer: Self-pay

## 2017-06-13 ENCOUNTER — Other Ambulatory Visit: Payer: Self-pay

## 2017-06-13 DIAGNOSIS — E875 Hyperkalemia: Secondary | ICD-10-CM | POA: Diagnosis not present

## 2017-06-13 DIAGNOSIS — I69951 Hemiplegia and hemiparesis following unspecified cerebrovascular disease affecting right dominant side: Secondary | ICD-10-CM | POA: Diagnosis not present

## 2017-06-13 DIAGNOSIS — G893 Neoplasm related pain (acute) (chronic): Secondary | ICD-10-CM

## 2017-06-13 DIAGNOSIS — I6521 Occlusion and stenosis of right carotid artery: Secondary | ICD-10-CM | POA: Diagnosis present

## 2017-06-13 DIAGNOSIS — Z7901 Long term (current) use of anticoagulants: Secondary | ICD-10-CM

## 2017-06-13 DIAGNOSIS — Z66 Do not resuscitate: Secondary | ICD-10-CM | POA: Diagnosis present

## 2017-06-13 DIAGNOSIS — E785 Hyperlipidemia, unspecified: Secondary | ICD-10-CM | POA: Diagnosis not present

## 2017-06-13 DIAGNOSIS — D6869 Other thrombophilia: Secondary | ICD-10-CM | POA: Diagnosis not present

## 2017-06-13 DIAGNOSIS — I82431 Acute embolism and thrombosis of right popliteal vein: Secondary | ICD-10-CM

## 2017-06-13 DIAGNOSIS — G8191 Hemiplegia, unspecified affecting right dominant side: Secondary | ICD-10-CM | POA: Diagnosis present

## 2017-06-13 DIAGNOSIS — F203 Undifferentiated schizophrenia: Secondary | ICD-10-CM | POA: Diagnosis not present

## 2017-06-13 DIAGNOSIS — Z7982 Long term (current) use of aspirin: Secondary | ICD-10-CM | POA: Diagnosis not present

## 2017-06-13 DIAGNOSIS — R188 Other ascites: Secondary | ICD-10-CM | POA: Diagnosis not present

## 2017-06-13 DIAGNOSIS — N179 Acute kidney failure, unspecified: Principal | ICD-10-CM | POA: Diagnosis present

## 2017-06-13 DIAGNOSIS — I639 Cerebral infarction, unspecified: Secondary | ICD-10-CM | POA: Diagnosis not present

## 2017-06-13 DIAGNOSIS — C801 Malignant (primary) neoplasm, unspecified: Secondary | ICD-10-CM | POA: Diagnosis present

## 2017-06-13 DIAGNOSIS — C775 Secondary and unspecified malignant neoplasm of intrapelvic lymph nodes: Secondary | ICD-10-CM | POA: Diagnosis not present

## 2017-06-13 DIAGNOSIS — N135 Crossing vessel and stricture of ureter without hydronephrosis: Secondary | ICD-10-CM | POA: Diagnosis present

## 2017-06-13 DIAGNOSIS — R180 Malignant ascites: Secondary | ICD-10-CM | POA: Diagnosis not present

## 2017-06-13 DIAGNOSIS — N170 Acute kidney failure with tubular necrosis: Secondary | ICD-10-CM | POA: Diagnosis not present

## 2017-06-13 DIAGNOSIS — Z87891 Personal history of nicotine dependence: Secondary | ICD-10-CM | POA: Diagnosis not present

## 2017-06-13 DIAGNOSIS — C7801 Secondary malignant neoplasm of right lung: Secondary | ICD-10-CM | POA: Diagnosis present

## 2017-06-13 DIAGNOSIS — Z86718 Personal history of other venous thrombosis and embolism: Secondary | ICD-10-CM | POA: Diagnosis not present

## 2017-06-13 DIAGNOSIS — I82409 Acute embolism and thrombosis of unspecified deep veins of unspecified lower extremity: Secondary | ICD-10-CM | POA: Diagnosis not present

## 2017-06-13 DIAGNOSIS — C7802 Secondary malignant neoplasm of left lung: Secondary | ICD-10-CM | POA: Diagnosis present

## 2017-06-13 DIAGNOSIS — Z888 Allergy status to other drugs, medicaments and biological substances status: Secondary | ICD-10-CM | POA: Diagnosis not present

## 2017-06-13 DIAGNOSIS — C7800 Secondary malignant neoplasm of unspecified lung: Secondary | ICD-10-CM | POA: Diagnosis not present

## 2017-06-13 DIAGNOSIS — Z515 Encounter for palliative care: Secondary | ICD-10-CM | POA: Diagnosis present

## 2017-06-13 DIAGNOSIS — I2609 Other pulmonary embolism with acute cor pulmonale: Secondary | ICD-10-CM | POA: Diagnosis not present

## 2017-06-13 DIAGNOSIS — I69319 Unspecified symptoms and signs involving cognitive functions following cerebral infarction: Secondary | ICD-10-CM

## 2017-06-13 DIAGNOSIS — F209 Schizophrenia, unspecified: Secondary | ICD-10-CM | POA: Diagnosis present

## 2017-06-13 DIAGNOSIS — C786 Secondary malignant neoplasm of retroperitoneum and peritoneum: Secondary | ICD-10-CM | POA: Diagnosis present

## 2017-06-13 DIAGNOSIS — I10 Essential (primary) hypertension: Secondary | ICD-10-CM | POA: Diagnosis present

## 2017-06-13 DIAGNOSIS — R4182 Altered mental status, unspecified: Secondary | ICD-10-CM | POA: Diagnosis not present

## 2017-06-13 DIAGNOSIS — Z79899 Other long term (current) drug therapy: Secondary | ICD-10-CM

## 2017-06-13 DIAGNOSIS — E1159 Type 2 diabetes mellitus with other circulatory complications: Secondary | ICD-10-CM | POA: Diagnosis not present

## 2017-06-13 DIAGNOSIS — I679 Cerebrovascular disease, unspecified: Secondary | ICD-10-CM | POA: Diagnosis not present

## 2017-06-13 LAB — COMPREHENSIVE METABOLIC PANEL
ALBUMIN: 2.2 g/dL — AB (ref 3.5–5.0)
ALT: 14 U/L (ref 14–54)
AST: 21 U/L (ref 15–41)
Alkaline Phosphatase: 69 U/L (ref 38–126)
Anion gap: 12 (ref 5–15)
BILIRUBIN TOTAL: 0.5 mg/dL (ref 0.3–1.2)
BUN: 77 mg/dL — AB (ref 6–20)
CHLORIDE: 105 mmol/L (ref 101–111)
CO2: 18 mmol/L — ABNORMAL LOW (ref 22–32)
CREATININE: 5.97 mg/dL — AB (ref 0.44–1.00)
Calcium: 9.2 mg/dL (ref 8.9–10.3)
GFR calc Af Amer: 8 mL/min — ABNORMAL LOW (ref >60)
GFR calc non Af Amer: 7 mL/min — ABNORMAL LOW (ref >60)
Glucose, Bld: 118 mg/dL — ABNORMAL HIGH (ref 65–99)
POTASSIUM: 7.3 mmol/L — AB (ref 3.5–5.1)
Sodium: 135 mmol/L (ref 135–145)
Total Protein: 7.6 g/dL (ref 6.5–8.1)

## 2017-06-13 LAB — CBC
HEMATOCRIT: 26.8 % — AB (ref 36.0–46.0)
Hemoglobin: 9 g/dL — ABNORMAL LOW (ref 12.0–15.0)
MCH: 26.2 pg (ref 26.0–34.0)
MCHC: 33.6 g/dL (ref 30.0–36.0)
MCV: 77.9 fL — AB (ref 78.0–100.0)
Platelets: 420 10*3/uL — ABNORMAL HIGH (ref 150–400)
RBC: 3.44 MIL/uL — ABNORMAL LOW (ref 3.87–5.11)
RDW: 17 % — AB (ref 11.5–15.5)
WBC: 11.4 10*3/uL — ABNORMAL HIGH (ref 4.0–10.5)

## 2017-06-13 LAB — LIPASE, BLOOD: LIPASE: 27 U/L (ref 11–51)

## 2017-06-13 MED ORDER — SODIUM CHLORIDE 0.9 % IV SOLN: 250.0000 mL | INTRAVENOUS | Status: DC | PRN

## 2017-06-13 MED ORDER — MORPHINE SULFATE (PF) 2 MG/ML IV SOLN: 2.0000 mg | INTRAVENOUS | Status: DC | PRN

## 2017-06-13 MED ORDER — ALBUTEROL SULFATE (2.5 MG/3ML) 0.083% IN NEBU: 2.5000 mg | INHALATION_SOLUTION | RESPIRATORY_TRACT | Status: DC | PRN

## 2017-06-13 MED ORDER — BENZTROPINE MESYLATE 0.5 MG PO TABS
2.0000 mg | ORAL_TABLET | Freq: Every day | ORAL | Status: DC
Start: 2017-06-14 — End: 2017-06-15
  Administered 2017-06-14 – 2017-06-15 (×2): 2 mg via ORAL
  Filled 2017-06-13 (×2): qty 4

## 2017-06-13 MED ORDER — CALCIUM GLUCONATE 10 % IV SOLN: 1.0000 g | Freq: Once | INTRAVENOUS | Status: DC

## 2017-06-13 MED ORDER — SODIUM CHLORIDE 0.9 % IV BOLUS
1000.0000 mL | Freq: Once | INTRAVENOUS | Status: AC
Start: 2017-06-13 — End: 2017-06-13
  Administered 2017-06-13: 1000 mL via INTRAVENOUS

## 2017-06-13 MED ORDER — SODIUM CHLORIDE 0.9% FLUSH
3.0000 mL | Freq: Two times a day (BID) | INTRAVENOUS | Status: DC
  Administered 2017-06-14 (×2): 3 mL via INTRAVENOUS

## 2017-06-13 MED ORDER — ONDANSETRON HCL 4 MG PO TABS: 4.0000 mg | ORAL_TABLET | Freq: Four times a day (QID) | ORAL | Status: DC | PRN

## 2017-06-13 MED ORDER — SODIUM CHLORIDE 0.9 % IV SOLN
1.0000 g | Freq: Once | INTRAVENOUS | Status: AC
  Administered 2017-06-13: 1 g via INTRAVENOUS
  Filled 2017-06-13: qty 10

## 2017-06-13 MED ORDER — INSULIN ASPART 100 UNIT/ML IV SOLN
10.0000 [IU] | Freq: Once | INTRAVENOUS | Status: AC
  Administered 2017-06-13: 10 [IU] via INTRAVENOUS
  Filled 2017-06-13: qty 0.1

## 2017-06-13 MED ORDER — APIXABAN 5 MG PO TABS
5.0000 mg | ORAL_TABLET | Freq: Two times a day (BID) | ORAL | Status: DC
  Administered 2017-06-14: 5 mg via ORAL
  Filled 2017-06-13: qty 1

## 2017-06-13 MED ORDER — ACETAMINOPHEN 650 MG RE SUPP: 650.0000 mg | Freq: Four times a day (QID) | RECTAL | Status: DC | PRN

## 2017-06-13 MED ORDER — ONDANSETRON HCL 4 MG/2ML IJ SOLN
4.0000 mg | Freq: Four times a day (QID) | INTRAMUSCULAR | Status: DC | PRN
Start: 2017-06-13 — End: 2017-06-15

## 2017-06-13 MED ORDER — DEXTROSE 50 % IV SOLN
1.0000 | Freq: Once | INTRAVENOUS | Status: AC
  Administered 2017-06-13: 50 mL via INTRAVENOUS
  Filled 2017-06-13: qty 50

## 2017-06-13 MED ORDER — ESCITALOPRAM OXALATE 10 MG PO TABS
5.0000 mg | ORAL_TABLET | Freq: Every day | ORAL | Status: DC
  Administered 2017-06-14 – 2017-06-15 (×2): 5 mg via ORAL
  Filled 2017-06-13 (×2): qty 1

## 2017-06-13 MED ORDER — SODIUM CHLORIDE 0.9% FLUSH: 3.0000 mL | INTRAVENOUS | Status: DC | PRN

## 2017-06-13 MED ORDER — ACETAMINOPHEN 325 MG PO TABS: 650.0000 mg | ORAL_TABLET | Freq: Four times a day (QID) | ORAL | Status: DC | PRN

## 2017-06-13 MED ORDER — TRAMADOL HCL 50 MG PO TABS
50.0000 mg | ORAL_TABLET | Freq: Two times a day (BID) | ORAL | Status: DC | PRN
Start: 2017-06-13 — End: 2017-06-14
  Administered 2017-06-13: 100 mg via ORAL
  Filled 2017-06-13: qty 2

## 2017-06-13 MED ORDER — HALOPERIDOL 5 MG PO TABS
15.0000 mg | ORAL_TABLET | Freq: Every day | ORAL | Status: DC
  Filled 2017-06-13: qty 3

## 2017-06-13 MED ORDER — LORAZEPAM 2 MG/ML IJ SOLN: 0.5000 mg | Freq: Four times a day (QID) | INTRAMUSCULAR | Status: DC | PRN

## 2017-06-13 MED ORDER — ALBUTEROL SULFATE (2.5 MG/3ML) 0.083% IN NEBU
10.0000 mg | INHALATION_SOLUTION | Freq: Once | RESPIRATORY_TRACT | Status: AC
  Administered 2017-06-13: 10 mg via RESPIRATORY_TRACT
  Filled 2017-06-13: qty 12

## 2017-06-13 NOTE — ED Notes (Signed)
Dr. Roel Cluck instructed to remove patient from the monitor. Patient does not want any further intervention. Patient receiving comfort care only.

## 2017-06-13 NOTE — H&P (Signed)
[name] [name] QMV:[id number] DOB: 14-Jan-1951 DOA: 06/13/2017     PCP: Biagio Borg, MD   Outpatient Specialists:   CARDS:  Dr. Einar Gip  Oncology  Dr. Irene Limbo Patient arrived to ER on 06/13/17 at 1528  Patient coming from:  home Lives  With family   Chief Complaint:  Chief Complaint  Patient presents with  . abdominal swelling    HPI: [name] [name] is a 67 y.o. female with medical history significant of hypertension and schizophrenia HLD,  metastatic carcinoma unknown primary, ascites, embolic CVA multiple      Presented with   similar abdominal pain and swelling last paracentesis was 1 April. Also endorsing worsening fatigue abdomen has been gradually getting bigger pain is worse with change in position and palpation better if she is still.  No associated chest pain which she is unable to take deep breaths secondary to abdominal swelling no fevers or chills. Occasional vomiting.  On March 15 patient presented with blurred vision and right-sided weakness and decreased appetite CT scan showed small bilateral occipital to early subacute infarctions CT angiogram showed right ICA occlusion with reconstitution CT a which has showed chronic thrombus as well as some acute PE in the lungs bilaterally and bilateral pulmonary nodules worrisome for metastatic spread also noted to have omental nodularity and the larger volume ascites. She undergone paracentesis on 17 March and had 3.8 L removed. Also noted to have right lower extremity DVT.  MRI of the brain showed multiple acute to subacute infarcts felt to be secondary to embolic process.  Started on IV heparin.  Echogram showed no evidence of PFO. Patient was refusing to have metastatic cancer of unknown origin but refused further workup at this time choosing to undergo palliative care instead. She was admitted to inpatient rehab finally was discharged to family care on 24 March.  She was discharged on Xarelto plan to follow-up with  neurology  Patient as a result of multiple strokes developed right hemiparesis and cognitive deficits. She undergone another paracentesis on 21 May 2017.      While in ER:  Noted to have new onset renal failure with hyperkalemia EKG changes she was started on calcium insulin albuterol and IV fluids  Following Medications were ordered in ER: Medications  sodium chloride 0.9 % bolus 1,000 mL (1,000 mLs Intravenous New Bag/Given 06/13/17 1818)  calcium gluconate 1 g in sodium chloride 0.9 % 100 mL IVPB (1 g Intravenous New Bag/Given 06/13/17 1813)  albuterol (PROVENTIL) (2.5 MG/3ML) 0.083% nebulizer solution 10 mg (10 mg Nebulization Given 06/13/17 1823)  insulin aspart (novoLOG) injection 10 Units (10 Units Intravenous Given 06/13/17 1821)  dextrose 50 % solution 50 mL (50 mLs Intravenous Given 06/13/17 1818)    Significant initial  Findings: Abnormal Labs Reviewed  COMPREHENSIVE METABOLIC PANEL - Abnormal; Notable for the following components:      Result Value   Potassium 7.3 (*)    CO2 18 (*)    Glucose, Bld 118 (*)    BUN 77 (*)    Creatinine, Ser 5.97 (*)    Albumin 2.2 (*)    GFR calc non Af Amer 7 (*)    GFR calc Af Amer 8 (*)    All other components within normal limits  CBC - Abnormal; Notable for the following components:   WBC 11.4 (*)    RBC 3.44 (*)    Hemoglobin 9.0 (*)    HCT 26.8 (*)    MCV 77.9 (*)  RDW 17.0 (*)    Platelets 420 (*)    All other components within normal limits     Na 135 K 7.3  Cr    Up from baseline see below Lab Results  Component Value Date   CREATININE 5.97 (H) 06/13/2017   CREATININE 1.34 (H) 05/18/2017   CREATININE 0.95 05/10/2017      WBC  11.4  HG/HCT  stable,       Component Value Date/Time   HGB 9.0 (L) 06/13/2017 1556   HCT 26.8 (L) 06/13/2017 1556      BNP (last 3 results) No results for input(s): BNP in the last 8760 hours.  ProBNP (last 3 results) No results for input(s): PROBNP in the last 8760  hours.  Lactic Acid, Venous No results found for: LATICACIDVEN    UA  ordered     CXR - *NON acute  CTabd/pelvis - *nonacute  ECG:  Personally reviewed by me showing: HR :118 Rhythm: sinus tachycardia Ischemic changes*nonspecific changes, no evidence of ischemic changes QTC 432       ED Triage Vitals  Enc Vitals Group     BP 06/13/17 1540 104/69     Pulse Rate 06/13/17 1540 (!) 111     Resp 06/13/17 1540 16     Temp 06/13/17 1540 (!) 97.5 F (36.4 C)     Temp Source 06/13/17 1540 Oral     SpO2 06/13/17 1540 95 %     Weight 06/13/17 1543 180 lb (81.6 kg)     Height 06/13/17 1543 5\' 8"  (1.727 m)     Head Circumference --      Peak Flow --      Pain Score 06/13/17 1542 8     Pain Loc --      Pain Edu? --      Excl. in [place]? --   TMAX(24)@       Latest  Blood pressure 104/69, pulse (!) 111, temperature (!) 97.5 F (36.4 C), temperature source Oral, resp. rate 16, height 5\' 8"  (1.727 m), weight 81.6 kg (180 lb), SpO2 95 %.      Hospitalist was called for admission for acute renal failure   Review of Systems:    Pertinent positives include:  fatigue,  shortness of breath at rest.abdominal pain Abdominal distention Constitutional:  No weight loss, night sweats, Fevers, chills,weight loss  HEENT:  No headaches, Difficulty swallowing,Tooth/dental problems,Sore throat,  No sneezing, itching, ear ache, nasal congestion, post nasal drip,  Cardio-vascular:  No chest pain, Orthopnea, PND, anasarca, dizziness, palpitations.no Bilateral lower extremity swelling  GI:  No heartburn, indigestion, , nausea, vomiting, diarrhea, change in bowel habits, loss of appetite, melena, blood in stool, hematemesis Resp:  no No dyspnea on exertion, No excess mucus, no productive cough, No non-productive cough, No coughing up of blood.No change in color of mucus.No wheezing. Skin:  no rash or lesions. No jaundice GU:  no dysuria, change in color of urine, no urgency or frequency.  No straining to urinate.  No flank pain.  Musculoskeletal:  No joint pain or no joint swelling. No decreased range of motion. No back pain.  Psych:  No change in mood or affect. No depression or anxiety. No memory loss.  Neuro: no localizing neurological complaints, no tingling, no weakness, no double vision, no gait abnormality, no slurred speech, no confusion  As per HPI otherwise 10 point review of systems negative.   Past Medical History:   Past Medical History:  Diagnosis Date  .  CTS (carpal tunnel syndrome)    bilateral  . FATIGUE 08/27/2008  . GLUCOSE INTOLERANCE 06/25/2008  . HYPERLIPIDEMIA 06/25/2008  . HYPERTENSION 06/25/2008  . Impaired glucose tolerance 06/12/2010  . SCHIZOPHRENIA 06/25/2008      Past Surgical History:  Procedure Laterality Date  . DILATION AND CURETTAGE OF UTERUS  1999   hx of after miscarriage  . s/p cervical procedure  1995   LEEP    Social History:    Independently     reports that she has quit smoking. She quit smokeless tobacco use about 41 years ago. She reports that she does not drink alcohol or use drugs.     Family History:   Family History  Problem Relation Age of Onset  . Hypertension Other   . Stroke Other     Allergies: Allergies  Allergen Reactions  . Nabumetone Rash     Prior to Admission medications   Medication Sig Start Date End Date Taking? Authorizing Provider  aspirin EC 81 MG EC tablet Take 1 tablet (81 mg total) by mouth daily. 05/11/17  Yes Angiulli, Lavon Paganini, PA-C  benztropine (COGENTIN) 2 MG tablet Take 1 tablet (2 mg total) by mouth daily. 05/11/17  Yes Angiulli, Lavon Paganini, PA-C  escitalopram (LEXAPRO) 5 MG tablet Take 1 tablet (5 mg total) by mouth daily. 05/11/17  Yes Angiulli, Lavon Paganini, PA-C  haloperidol (HALDOL) 10 MG tablet Take 1.5 tablets (15 mg total) by mouth daily. Take 1 1/2 tab by mouth daily 05/11/17  Yes Angiulli, Lavon Paganini, PA-C  lovastatin (MEVACOR) 40 MG tablet Take 1 tablet (40 mg total) by mouth  daily. 05/11/17  Yes Angiulli, Lavon Paganini, PA-C  potassium chloride (K-DUR,KLOR-CON) 10 MEQ tablet Take 10 mEq by mouth 2 (two) times daily.   Yes [provider]  rivaroxaban (XARELTO) 20 MG TABS tablet 20 mg daily beginning 05/31/2017 05/11/17  Yes Angiulli, Lavon Paganini, PA-C  traMADol (ULTRAM) 50 MG tablet Take 1-2 tablets (50-100 mg total) by mouth every 6 (six) hours as needed for moderate pain. 05/28/17  Yes Biagio Borg, MD  vitamin B-12 1000 MCG tablet Take 1 tablet (1,000 mcg total) by mouth daily. 05/11/17  Yes Angiulli, Lavon Paganini, PA-C  Rivaroxaban (XARELTO) 15 MG TABS tablet 15 mg twice daily through 05/30/2017 and stop Patient not taking: Reported on 06/13/2017 05/11/17   Angiulli, Lavon Paganini, PA-C   Physical Exam: Blood pressure 104/69, pulse (!) 111, temperature (!) 97.5 F (36.4 C), temperature source Oral, resp. rate 16, height 5\' 8"  (1.727 m), weight 81.6 kg (180 lb), SpO2 95 %. 1. General:  in No Acute distress  Chronically ill  -appearing 2. Psychological: Alert and  Oriented 3. Head/ENT:      Dry Mucous Membranes                          Head Non traumatic, neck supple                            Poor Dentition 4. SKIN:   decreased Skin turgor,  Skin clean Dry and intact no rash 5. Heart: Regular rate and rhythm no Murmur, no Rub or gallop 6. Lungs:  no wheezes or crackles   7. Abdomen: Soft, non-tender,   distended  bowel sounds present 8. Lower extremities: no clubbing, cyanosis, or edema 9. Neurologically Grossly intact, moving all 4 extremities equally  10. MSK: Normal range of  motion   LABS:     Recent Labs  Lab 06/13/17 1556  WBC 11.4*  HGB 9.0*  HCT 26.8*  MCV 77.9*  PLT 161*   Basic Metabolic Panel: Recent Labs  Lab 06/13/17 1556  NA 135  K 7.3*  CL 105  CO2 18*  GLUCOSE 118*  BUN 77*  CREATININE 5.97*  CALCIUM 9.2      Recent Labs  Lab 06/13/17 1556  AST 21  ALT 14  ALKPHOS 69  BILITOT 0.5  PROT 7.6  ALBUMIN 2.2*   Recent Labs   Lab 06/13/17 1556  LIPASE 27   No results for input(s): AMMONIA in the last 168 hours.    HbA1C: No results for input(s): HGBA1C in the last 72 hours. CBG: No results for input(s): GLUCAP in the last 168 hours.    Urine analysis:    Component Value Date/Time   COLORURINE YELLOW 05/04/2017 0212   APPEARANCEUR HAZY (A) 05/04/2017 0212   LABSPEC 1.009 05/04/2017 0212   PHURINE 6.0 05/04/2017 0212   GLUCOSEU NEGATIVE 05/04/2017 0212   GLUCOSEU NEGATIVE 09/26/2016 1658   HGBUR MODERATE (A) 05/04/2017 0212   BILIRUBINUR NEGATIVE 05/04/2017 0212   KETONESUR NEGATIVE 05/04/2017 0212   PROTEINUR 100 (A) 05/04/2017 0212   UROBILINOGEN 1.0 09/26/2016 1658   NITRITE NEGATIVE 05/04/2017 0212   LEUKOCYTESUR LARGE (A) 05/04/2017 0212       Cultures:    Component Value Date/Time   SDES ASCITIC 05/06/2017 1148   SDES ASCITIC 05/06/2017 1148   SPECREQUEST NONE 05/06/2017 1148   SPECREQUEST NONE 05/06/2017 1148   CULT  05/06/2017 1148    NO GROWTH 5 DAYS Performed at [facility], [place] [address], [place], [place] [address]    REPTSTATUS 05/11/2017 FINAL 05/06/2017 1148   REPTSTATUS 05/06/2017 FINAL 05/06/2017 1148     Radiological Exams on Admission: No results found.  Chart has been reviewed    Assessment/Plan   67 y.o. female with medical history significant of hypertension and schizophrenia HLD,  metastatic carcinoma unknown primary, ascites, embolic CVA multiple Admitted for acute renal failure in the setting of metastatic carcinoma and unknown primary with significant ascites patient at this point wishes to be comfort care only  Present on Admission: . Acute renal failure (ARF) (HCC) -unclear etiology possibly related to malignancy rapid progression if hyperkalemia.  Discussed with patient and family she at this point does not wish to undergo any further procedures or workup she understands that she is very likely to pass in the near future.  I have spent  extensive time with family and patient to discuss her prognosis.  At this point patient would like [place] to concentrate on comfort measures only family at bedside her husband is saddened but understands that those are her wishes. Given severity of renal failure and electrolyte disturbance expect the patient may pass in the next hours to days.  Will order palliative care consult . Acute deep vein thrombosis (DVT) of popliteal vein of right lower extremity (HCC) switch from Xarelto to Eliquis dose per pharmacy . Cerebral infarction [facility]) in the setting of possibly embolic events patient on anticoagulation . Essential hypertension hold off on home medications . Hyperlipidemia hold off on statins . Malignant ascites patient at this point states she does not want to be "poked" patient not interested in further workup or procedures she would like to be left in peace.  Pain management as needed and supportive management . Schizophrenia ([place]) continue home medications  Other plan as per orders.  DVT prophylaxis: as per pharmacy  Code Status:    DNR/DNI  comfort care as per patient family  I had personally discussed CODE STATUS with patient and family  I had spent 25 min discussing goals of care and CODE STATUS  Family Communication:   Family  at  Bedside  plan of care was discussed with Husband,   Disposition Plan:      To home once workup is complete and patient  as per patient request when everything is set up at home                                     Palliative care   consulted                          Consults called: None patient is comfort care  Admission status:    inpatient        Level of care     medical floor            Toy Baker 06/14/2017, 12:38 AM    [facility]  Pager [phone]   after 2 AM please page floor coverage PA If 7AM-7PM, please contact the day team taking care of the patient  [email or web address]  Password [email or web address]

## 2017-06-13 NOTE — ED Notes (Signed)
First attempt to call report made. Nurse said she will call back for report in 15 min.

## 2017-06-13 NOTE — Telephone Encounter (Signed)
Copied from [place] #[id number]. Topic: Quick Communication - See Telephone Encounter >> Jun 13, 2017  9:33 AM Arletha Grippe wrote: CRM for notification. See Telephone encounter for: 06/13/17. Husband called - he is asking for samples of xalreto samples in 20 mg.  The meds cost $50 and pt does not get paid only 1 time a month  Cb is [phone]

## 2017-06-13 NOTE — ED Notes (Signed)
ED TO INPATIENT HANDOFF REPORT  Name/Age/Gender [name] [name] 67 y.o. female  Code Status Code Status History    Date Active Date Inactive Code Status Order ID Comments User Context   05/08/2017 1858 05/13/2017 1216 Full Code [id number]  Elizabeth Sauer Inpatient   05/08/2017 1858 05/08/2017 1858 Full Code [id number]  Elizabeth Sauer Inpatient   05/08/2017 1627 05/08/2017 1749 DNR [id number]  Fuller Canada, PA-C Inpatient   05/04/2017 0442 05/08/2017 1627 Full Code [id number]  Vianne Bulls, MD Inpatient      Home/SNF/Other Home  Chief Complaint abdomin need drainage  Level of Care/Admitting Diagnosis ED Disposition    ED Disposition Condition [place]: [facility] [[id number]]  Level of Care: Med-Surg [16]  Diagnosis: Acute renal failure (ARF) ([place]) [387564]  Admitting Physician: Toy Baker [[id number]]  Attending Physician: Toy Baker [[id number]]  Estimated length of stay: 3 - 4 days  Certification:: I certify this patient will need inpatient services for at least 2 midnights  PT Class (Do Not Modify): Inpatient [101]  PT Acc Code (Do Not Modify): Private [1]       Medical History Past Medical History:  Diagnosis Date  . CTS (carpal tunnel syndrome)    bilateral  . FATIGUE 08/27/2008  . GLUCOSE INTOLERANCE 06/25/2008  . HYPERLIPIDEMIA 06/25/2008  . HYPERTENSION 06/25/2008  . Impaired glucose tolerance 06/12/2010  . SCHIZOPHRENIA 06/25/2008    Allergies Allergies  Allergen Reactions  . Nabumetone Rash    IV Location/Drains/Wounds Patient Lines/Drains/Airways Status   Active Line/Drains/Airways    Name:   Placement date:   Placement time:   Site:   Days:   Peripheral IV 06/13/17 Left Antecubital   06/13/17    1749    Antecubital   less than 1          Labs/Imaging Results for orders placed or performed during the hospital encounter of 06/13/17 (from the past 48 hour(s))  Lipase, blood     Status:  None   Collection Time: 06/13/17  3:56 PM  Result Value Ref Range   Lipase 27 11 - 51 U/L    Comment: Performed at [facility], [place] [address], [place], [place] [address]  Comprehensive metabolic panel     Status: Abnormal   Collection Time: 06/13/17  3:56 PM  Result Value Ref Range   Sodium 135 135 - 145 mmol/L   Potassium 7.3 (HH) 3.5 - 5.1 mmol/L    Comment: NO VISIBLE HEMOLYSIS CRITICAL RESULT CALLED TO, READ BACK BY AND VERIFIED WITH: TIM SMITH,RN [id number] @ 1651 BY J SCOTTON    Chloride 105 101 - 111 mmol/L   CO2 18 (L) 22 - 32 mmol/L   Glucose, Bld 118 (H) 65 - 99 mg/dL   BUN 77 (H) 6 - 20 mg/dL   Creatinine, Ser 5.97 (H) 0.44 - 1.00 mg/dL   Calcium 9.2 8.9 - 10.3 mg/dL   Total Protein 7.6 6.5 - 8.1 g/dL   Albumin 2.2 (L) 3.5 - 5.0 g/dL   AST 21 15 - 41 U/L   ALT 14 14 - 54 U/L   Alkaline Phosphatase 69 38 - 126 U/L   Total Bilirubin 0.5 0.3 - 1.2 mg/dL   GFR calc non Af Amer 7 (L) >60 mL/min   GFR calc Af Amer 8 (L) >60 mL/min    Comment: (NOTE) The eGFR has been calculated using the CKD EPI equation. This calculation has not  been validated in all clinical situations. eGFR's persistently <60 mL/min signify possible Chronic Kidney Disease.    Anion gap 12 5 - 15    Comment: Performed at [facility], [place] [address] [place], [place] [address]  CBC     Status: Abnormal   Collection Time: 06/13/17  3:56 PM  Result Value Ref Range   WBC 11.4 (H) 4.0 - 10.5 K/uL   RBC 3.44 (L) 3.87 - 5.11 MIL/uL   Hemoglobin 9.0 (L) 12.0 - 15.0 g/dL   HCT 26.8 (L) 36.0 - 46.0 %   MCV 77.9 (L) 78.0 - 100.0 fL   MCH 26.2 26.0 - 34.0 pg   MCHC 33.6 30.0 - 36.0 g/dL   RDW 17.0 (H) 11.5 - 15.5 %   Platelets 420 (H) 150 - 400 K/uL    Comment: Performed at [facility], [place] [address] [place], [place] [address]   Ct Abdomen Pelvis Wo Contrast  Result Date: 06/13/2017 CLINICAL DATA:  Abdominal pain and swelling. Gradually getting  bigger. Abdomen was drained 3 weeks ago. EXAM: CT ABDOMEN AND PELVIS WITHOUT CONTRAST TECHNIQUE: Multidetector CT imaging of the abdomen and pelvis was performed following the standard protocol without IV contrast. COMPARISON:  CT chest 05/04/2017 FINDINGS: Lower chest: Motion artifact limits evaluation. No focal consolidation. Several nodules demonstrated in the right lung base, largest measuring 6 mm diameter. No change since previous study. Hepatobiliary: No focal liver abnormality is seen. No gallstones, gallbladder wall thickening, or biliary dilatation. Pancreas: Unremarkable. No pancreatic ductal dilatation or surrounding inflammatory changes. Spleen: Normal in size without focal abnormality. Adrenals/Urinary Tract: No adrenal gland nodules. Prominent atrophy of the left kidney. No hydronephrosis in either kidney. No renal or ureteral stones identified. Bladder wall is thickened, suggesting possible cystitis. No bladder stones identified. Stomach/Bowel: Stomach is within normal limits. Appendix appears normal. No evidence of bowel wall thickening, distention, or inflammatory changes. Vascular/Lymphatic: Minimal scattered aortic calcification. No aneurysm. Prominent retroperitoneal lymph nodes, measuring up to about 12 mm short axis dimension. Prominent lymph nodes in the groins. Left groin dominant lymph node measures about 1.4 cm diameter. These are nonspecific but could indicate metastatic lesions. Reproductive: Uterus and ovaries are not enlarged. Focal increased density within the uterine myometrium likely to represent a small calcified fibroid. Other: Prominent diffuse abdominal and pelvic ascites resulting in severe abdominal distention. No free air. Suggestion of nodularity in the omentum could indicate carcinomatosis. Correlation with cytology from paracentesis done on 05/06/2017 indicates malignant cells present consistent with carcinoma suggestive of gynecologic primary. Abdominal wall musculature  appears intact. Musculoskeletal: Degenerative changes in the spine. No destructive bone lesions. IMPRESSION: 1. Abdominal distention due to prominent ascites diffusely throughout the abdomen and pelvis. Omental nodularity consistent with peritoneal carcinomatosis. 2. Right lung base nodules measuring up to 6 mm in diameter. No change since previous study. Possible metastatic disease. 3. Moderately prominent lymph nodes in the retroperitoneum and groin regions, possibly metastatic. 4. Uterus and ovaries are not enlarged. No definite primary lesion identified. Consider follow-up with ultrasound or MRI after paracentesis. 5. Asymmetric left renal atrophy. 6. Bladder wall thickening suggesting cystitis. 7. Aortic atherosclerosis. Electronically Signed   By: Lucienne Capers M.D.   On: 06/13/2017 21:42    Pending Labs Unresulted Labs (From admission, onward)   Start     Ordered   06/14/17 0500  Creatinine, serum  Tomorrow morning,   R     06/13/17 2034   06/13/17 1546  Urinalysis, Routine w reflex  microscopic  STAT,   STAT     06/13/17 1546      Vitals/Pain Today's Vitals   06/13/17 1542 06/13/17 1543 06/13/17 1906 06/13/17 1936  BP:      Pulse:   (!) 124 (!) 122  Resp:   18 19  Temp:      TempSrc:      SpO2:    96%  Weight:  180 lb (81.6 kg)    Height:  _0  (1.727 m)    PainSc: 8        Isolation Precautions No active isolations  Medications Medications  apixaban (ELIQUIS) tablet 5 mg (has no administration in time range)  albuterol (PROVENTIL) (2.5 MG/3ML) 0.083% nebulizer solution 10 mg (10 mg Nebulization Given 06/13/17 1823)  insulin aspart (novoLOG) injection 10 Units (10 Units Intravenous Given 06/13/17 1821)  dextrose 50 % solution 50 mL (50 mLs Intravenous Given 06/13/17 1818)  sodium chloride 0.9 % bolus 1,000 mL (0 mLs Intravenous Stopped 06/13/17 1946)  calcium gluconate 1 g in sodium chloride 0.9 % 100 mL IVPB (0 g Intravenous Stopped 06/13/17 1946)    Mobility walks  with person assist

## 2017-06-13 NOTE — ED Triage Notes (Signed)
Patient c/o abdominal pain and swelling. Patient states the pain and swelling has been gradually getting bigger. Patient states her abdomen was drained 3 weeks ago.

## 2017-06-13 NOTE — Progress Notes (Signed)
ANTICOAGULATION CONSULT NOTE - Initial Consult  Pharmacy Consult for PTA rivaroxaban to apixaban Indication: pulmonary embolus and DVT  Allergies  Allergen Reactions  . Nabumetone Rash    Patient Measurements: Height: 5\' 8"  (172.7 cm) Weight: 180 lb (81.6 kg) IBW/kg (Calculated) : 63.9 Heparin Dosing Weight:   Vital Signs: Temp: 97.5 F (36.4 C) (04/24 1540) Temp Source: Oral (04/24 1540) BP: 104/69 (04/24 1540) Pulse Rate: 122 (04/24 1936)  Labs: Recent Labs    06/13/17 1556  HGB 9.0*  HCT 26.8*  PLT 420*  CREATININE 5.97*    Estimated Creatinine Clearance: 10.7 mL/min (A) (by C-G formula based on SCr of 5.97 mg/dL (H)).   Medical History: Past Medical History:  Diagnosis Date  . CTS (carpal tunnel syndrome)    bilateral  . FATIGUE 08/27/2008  . GLUCOSE INTOLERANCE 06/25/2008  . HYPERLIPIDEMIA 06/25/2008  . HYPERTENSION 06/25/2008  . Impaired glucose tolerance 06/12/2010  . SCHIZOPHRENIA 06/25/2008     Assessment: 22 YOF with metastatic cancer (primary unknown) with recent (Mid March 2019) PE and DVT.  She has been on rivaroxaban and transitioned to the 20mg  daily dose (last dose 4/24 at noon). Pharmacy now asked to dose apixaban, presume change from rivaroxaban due to AKI.   Today, 06/13/2017  Renal: noted to have AKI (Scr 3/29 1.34 to 5.97)  CBC: Hgb decreases/little lower than previous values, pltc elevated  Goal of Therapy:  Dose per indication and patient-specific factors  Plan:   Based on timing of last dose of rivaroxaban taken at home, start apixaban 5mg  PO BID starting 4/25 PM  Follow renal function closely  If Scr does not improve, may want to consider alternative anticoagulation (the mfg does not state dose adjustment needed for treating VTE but apixaban not studied in clinical trials in patients with CrCl < 31ml/min or SCr > 2.5)  Doreene Eland, PharmD, BCPS.   Pager: [phone] 06/13/2017 8:19 PM

## 2017-06-13 NOTE — ED Notes (Signed)
Date and time results received: 06/13/17 1703 (use smartphrase ".now" to insert current time)  Test: K+ Critical Value: 7.3  Name of Provider Notified: Dr. Sherry Ruffing  Orders Received? Or Actions Taken?:

## 2017-06-13 NOTE — ED Provider Notes (Addendum)
[facility] DEPT Provider Note  CSN: [id number] Arrival date & time: 06/13/17 1528  Chief Complaint(s) abdominal swelling  HPI [name] [name] is a 67 y.o. 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  Patient reports that she had a paracentesis performed 3 weeks ago at the oncology/hematology clinic.  States that since then she her abdomen has been gradually getting larger.  She reports associated abdominal discomfort and pain that has been ongoing since this began several weeks ago.  Pain is worse with certain positions and palpation.  Alleviated by being still.  Patient denies any chest pain but endorses some difficulty getting a good deep breath.  She denies any fevers or chills.  No coughing.  Some nausea but infrequent vomiting that is nonbloody nonbilious.  No diarrhea.  No urinary symptoms.  HPI  Past Medical History Past Medical History:  Diagnosis Date  . CTS (carpal tunnel syndrome)    bilateral  . FATIGUE 08/27/2008  . GLUCOSE INTOLERANCE 06/25/2008  . HYPERLIPIDEMIA 06/25/2008  . HYPERTENSION 06/25/2008  . Impaired glucose tolerance 06/12/2010  . SCHIZOPHRENIA 06/25/2008   Patient Active Problem List   Diagnosis Date Noted  . Urinary retention 05/18/2017  . Pulmonary embolus ([place])   . Leukocytosis   . Acute blood loss anemia   . Hypoalbuminemia   . Hyponatremia   . Metastatic carcinoma ([place])   . Occipital infarction ([place]) 05/08/2017  . Malignant ascites   . DNR (do not resuscitate)   . Palliative care encounter   . Stroke ([place]) 05/04/2017  . AKI (acute kidney injury) ([place]) 05/04/2017  . Normocytic anemia 05/04/2017  . Acute lower UTI 05/04/2017  . Cerebral infarction ([place])   . Acute deep vein thrombosis (DVT) of popliteal vein of right lower  extremity (HCC)   . Acute saddle pulmonary embolism with acute cor pulmonale (HCC)   . Acute gouty arthritis 05/01/2017  . Impaired glucose tolerance 06/12/2010  . Preventative health care 06/12/2010  . FATIGUE 08/27/2008  . Hyperlipidemia 06/25/2008  . Schizophrenia ([place]) 06/25/2008  . Essential hypertension 06/25/2008   Home Medication(s) Prior to Admission medications   Medication Sig Start Date End Date Taking? Authorizing Provider  aspirin EC 81 MG EC tablet Take 1 tablet (81 mg total) by mouth daily. 05/11/17  Yes Angiulli, Lavon Paganini, PA-C  benztropine (COGENTIN) 2 MG tablet Take 1 tablet (2 mg total) by mouth daily. 05/11/17  Yes Angiulli, Lavon Paganini, PA-C  escitalopram (LEXAPRO) 5 MG tablet Take 1 tablet (5 mg total) by mouth daily. 05/11/17  Yes Angiulli, Lavon Paganini, PA-C  haloperidol (HALDOL) 10 MG tablet Take 1.5 tablets (15 mg total) by mouth daily. Take 1 1/2 tab by mouth daily 05/11/17  Yes Angiulli, Lavon Paganini, PA-C  lovastatin (MEVACOR) 40 MG tablet Take 1 tablet (40 mg total) by mouth daily. 05/11/17  Yes Angiulli, Lavon Paganini, PA-C  potassium chloride (K-DUR,KLOR-CON) 10 MEQ tablet Take 10 mEq by mouth 2 (two) times daily.   Yes [provider]  rivaroxaban (XARELTO) 20 MG TABS tablet 20 mg daily beginning 05/31/2017 05/11/17  Yes Angiulli, Lavon Paganini, PA-C  traMADol (ULTRAM) 50 MG tablet Take 1-2 tablets (50-100 mg total) by mouth every 6 (six) hours as needed for moderate pain. 05/28/17  Yes Biagio Borg, MD  vitamin B-12 1000 MCG tablet Take 1 tablet (  1,000 mcg total) by mouth daily. 05/11/17  Yes Angiulli, Lavon Paganini, PA-C  Rivaroxaban (XARELTO) 15 MG TABS tablet 15 mg twice daily through 05/30/2017 and stop Patient not taking: Reported on 06/13/2017 05/11/17   Angiulli, Lavon Paganini, PA-C                                                                                                                                    Past Surgical History Past Surgical History:  Procedure Laterality Date   . DILATION AND CURETTAGE OF UTERUS  1999   hx of after miscarriage  . s/p cervical procedure  1995   LEEP   Family History Family History  Problem Relation Age of Onset  . Hypertension Other   . Stroke Other     Social History Social History   Tobacco Use  . Smoking status: Former Research scientist (life sciences)  . Smokeless tobacco: Former Systems developer    Quit date: 02/21/1976  . Tobacco comment: quit a long time ago;   Substance Use Topics  . Alcohol use: No    Alcohol/week: 0.0 oz  . Drug use: No   Allergies Nabumetone  Review of Systems Review of Systems All other systems are reviewed and are negative for acute change except as noted in the HPI  Physical Exam Vital Signs  I have reviewed the triage vital signs BP 104/69 (BP Location: Left Arm)   Pulse (!) 111   Temp (!) 97.5 F (36.4 C) (Oral)   Resp 16   Ht 5\' 8"  (1.727 m)   Wt 81.6 kg (180 lb)   SpO2 95%   BMI 27.37 kg/m   Physical Exam  Constitutional: She is oriented to person, place, and time. She appears well-developed and well-nourished. She has a sickly appearance. No distress.  HENT:  Head: Normocephalic and atraumatic.  Nose: Nose normal.  Eyes: Pupils are equal, round, and reactive to light. Conjunctivae and EOM are normal. Right eye exhibits no discharge. Left eye exhibits no discharge. No scleral icterus.  Neck: Normal range of motion. Neck supple.  Cardiovascular: Regular rhythm. Tachycardia present. Exam reveals no gallop and no friction rub.  No murmur heard. Pulmonary/Chest: Effort normal and breath sounds normal. No stridor. No respiratory distress. She has no rales.  Abdominal: Soft. She exhibits distension. There is generalized tenderness. There is no rigidity, no rebound and no guarding.  Musculoskeletal: She exhibits no edema or tenderness.  Neurological: She is alert and oriented to person, place, and time.  Skin: Skin is warm and dry. No rash noted. She is not diaphoretic. No erythema.  Psychiatric: She has a  normal mood and affect.  Vitals reviewed.   ED Results and Treatments Labs (all labs ordered are listed, but only abnormal results are displayed) Labs Reviewed  COMPREHENSIVE METABOLIC PANEL - Abnormal; Notable for the following components:      Result Value   Potassium 7.3 (*)    CO2 18 (*)  Glucose, Bld 118 (*)    BUN 77 (*)    Creatinine, Ser 5.97 (*)    Albumin 2.2 (*)    GFR calc non Af Amer 7 (*)    GFR calc Af Amer 8 (*)    All other components within normal limits  CBC - Abnormal; Notable for the following components:   WBC 11.4 (*)    RBC 3.44 (*)    Hemoglobin 9.0 (*)    HCT 26.8 (*)    MCV 77.9 (*)    RDW 17.0 (*)    Platelets 420 (*)    All other components within normal limits  LIPASE, BLOOD  URINALYSIS, ROUTINE W REFLEX MICROSCOPIC  PROTIME-INR                                                                                                                         EKG  EKG Interpretation  Date/Time:  Wednesday June 13 2017 18:24:12 EDT Ventricular Rate:  118 PR Interval:    QRS Duration: 95 QT Interval:  308 QTC Calculation: 432 R Axis:   84 Text Interpretation:  Sinus tachycardia Borderline right axis deviation Low voltage, precordial leads Borderline T abnormalities, anterior leads no peaked T waves or wide QRS Otherwise no significant change Confirmed by Addison Lank [phone]) on 06/13/2017 6:28:41 PM      Radiology No results found. Pertinent labs & imaging results that were available during my care of the patient were reviewed by me and considered in my medical decision making (see chart for details).  Medications Ordered in ED Medications  sodium chloride 0.9 % bolus 1,000 mL (1,000 mLs Intravenous New Bag/Given 06/13/17 1818)  albuterol (PROVENTIL) (2.5 MG/3ML) 0.083% nebulizer solution 10 mg (10 mg Nebulization Given 06/13/17 1823)  insulin aspart (novoLOG) injection 10 Units (10 Units Intravenous Given 06/13/17 1821)  dextrose 50 % solution  50 mL (50 mLs Intravenous Given 06/13/17 1818)  calcium gluconate 1 g in sodium chloride 0.9 % 100 mL IVPB (1 g Intravenous New Bag/Given 06/13/17 1813)                                                                                                                                    Procedures Procedures CRITICAL CARE Performed by: Grayce Sessions Zadiel Leyh Total critical care time: 45 minutes Critical care time was exclusive of separately billable procedures and treating other patients. Critical care was  necessary to treat or prevent imminent or life-threatening deterioration. Critical care was time spent personally by me on the following activities: development of treatment plan with patient and/or surrogate as well as nursing, discussions with consultants, evaluation of patient's response to treatment, examination of patient, obtaining history from patient or surrogate, ordering and performing treatments and interventions, ordering and review of laboratory studies, ordering and review of radiographic studies, pulse oximetry and re-evaluation of patient's condition.   (including critical care time)  Medical Decision Making / ED Course I have reviewed the nursing notes for this encounter and the patient's prior records (if available in EHR or on provided paperwork).    Patient with ascites.  She is sickly appearing, tachycardic but normotensive.  She is afebrile.  Screening labs obtain revealed renal failure with hyperkalemia.  EKGw/o peaked T waves or wide QRS.  Patient started on temporizing measures with calcium, insulin, albuterol, IV fluids.  Briefly discussed goals of care and patient and husband reported that they would like to pursue diagnostic and treatment measures for the carcinoma.   Will discuss case with medicine for admission and continued management.  Final Clinical Impression(s) / ED Diagnoses Final diagnoses:  Acute renal failure, unspecified acute renal failure type ([place])    Hyperkalemia  Malignant ascites      This chart was dictated using voice recognition software.  Despite best efforts to proofread,  errors can occur which can change the documentation meaning.     Fatima Blank, MD 06/13/17 1919

## 2017-06-13 NOTE — Telephone Encounter (Signed)
Called pt's husband, informed him that I had a few samples waiting at the front desk for the patient. He stated that he will come by some time today to pick them up.

## 2017-06-14 ENCOUNTER — Inpatient Hospital Stay (HOSPITAL_COMMUNITY): Payer: Medicare Other

## 2017-06-14 DIAGNOSIS — Z515 Encounter for palliative care: Secondary | ICD-10-CM

## 2017-06-14 DIAGNOSIS — G893 Neoplasm related pain (acute) (chronic): Secondary | ICD-10-CM

## 2017-06-14 DIAGNOSIS — N179 Acute kidney failure, unspecified: Principal | ICD-10-CM

## 2017-06-14 DIAGNOSIS — N170 Acute kidney failure with tubular necrosis: Secondary | ICD-10-CM

## 2017-06-14 DIAGNOSIS — F209 Schizophrenia, unspecified: Secondary | ICD-10-CM

## 2017-06-14 DIAGNOSIS — E875 Hyperkalemia: Secondary | ICD-10-CM

## 2017-06-14 DIAGNOSIS — F203 Undifferentiated schizophrenia: Secondary | ICD-10-CM

## 2017-06-14 DIAGNOSIS — I10 Essential (primary) hypertension: Secondary | ICD-10-CM

## 2017-06-14 DIAGNOSIS — R180 Malignant ascites: Secondary | ICD-10-CM

## 2017-06-14 LAB — URINALYSIS, ROUTINE W REFLEX MICROSCOPIC
BILIRUBIN URINE: NEGATIVE
Glucose, UA: NEGATIVE mg/dL
Ketones, ur: 5 mg/dL — AB
NITRITE: NEGATIVE
PH: 5 (ref 5.0–8.0)
Protein, ur: 30 mg/dL — AB
Specific Gravity, Urine: 1.015 (ref 1.005–1.030)

## 2017-06-14 LAB — CREATININE, SERUM
Creatinine, Ser: 5.69 mg/dL — ABNORMAL HIGH (ref 0.44–1.00)
GFR calc non Af Amer: 7 mL/min — ABNORMAL LOW (ref >60)
GFR, EST AFRICAN AMERICAN: 8 mL/min — AB (ref >60)

## 2017-06-14 MED ORDER — LORAZEPAM 2 MG/ML IJ SOLN: 0.5000 mg | INTRAMUSCULAR | Status: DC | PRN

## 2017-06-14 MED ORDER — HALOPERIDOL LACTATE 2 MG/ML PO CONC
15.0000 mg | Freq: Every day | ORAL | Status: DC
  Administered 2017-06-14 – 2017-06-15 (×2): 15 mg via ORAL
  Filled 2017-06-14 (×3): qty 7.5

## 2017-06-14 MED ORDER — HYDROMORPHONE HCL 1 MG/ML IJ SOLN
0.5000 mg | INTRAMUSCULAR | Status: DC | PRN
  Administered 2017-06-14: 0.5 mg via INTRAVENOUS
  Filled 2017-06-14: qty 0.5

## 2017-06-14 MED ORDER — TRAMADOL HCL 50 MG PO TABS: 50.0000 mg | ORAL_TABLET | Freq: Two times a day (BID) | ORAL | Status: DC | PRN

## 2017-06-14 MED ORDER — GLYCOPYRROLATE 0.2 MG/ML IJ SOLN
0.2000 mg | INTRAMUSCULAR | Status: DC | PRN
  Filled 2017-06-14: qty 1

## 2017-06-14 MED ORDER — LIDOCAINE HCL 1 % IJ SOLN
INTRAMUSCULAR | Status: AC
  Filled 2017-06-14: qty 10

## 2017-06-14 MED ORDER — TRAMADOL HCL 50 MG PO TABS
50.0000 mg | ORAL_TABLET | Freq: Four times a day (QID) | ORAL | Status: DC | PRN
Start: 2017-06-14 — End: 2017-06-14

## 2017-06-14 NOTE — Consult Note (Signed)
Consultation Note Date: 06/14/2017   Patient Name: [name] [name]  DOB: 1950-12-31  MRN: [id number]  Age / Sex: 67 y.o., female  PCP: Biagio Borg, MD Referring Physician: Roney Jaffe, MD  Reason for Consultation: Establishing goals of care and Terminal Care  HPI/Patient Profile: 67 y.o. female  with past medical history of metastatic cancer of unknown origin, hypertension, hyperlipidemia, schizophrenia, embolic CVA admitted on 2/48/2500 with abdominal swelling. Hospitalization in March 2019 with CT revealing small bilateral occipital to early subacute infarctions, CTA revealing right ICA occlusion with chronic thrombus and bilateral acute PE's and pulmonary nodules worrisome for metastatic cancer. Patient also found to have omental nodularity and large volume ascites s/p paracentesis on 3/17 and 4/1. Positive for right lower extremity DVT. Patient declined further workup for metastatic cancer. As a result of multiple embolic strokes, patient developed right hemiparesis and cognitive deficits. In ED, patient found to be in acute renal failure (77/5.97) and potassium of 7.3. Per attending physician, patient declined further workup or procedures and requesting comfort measures. Palliative medicine consultation for terminal care.   Clinical Assessment and Goals of Care: I have reviewed medical records , discussed with care team, and met with patient and husband at bedside to discuss diagnosis, prognosis, GOC, EOL wishes, disposition and options. Multiple family members at bedside but patient requesting other family members to step outside during our conversation.   Patient will wake to voice but remains drowsy throughout the conversation. She does tell me she is having abdominal pain. RN to give prn dilaudid.   Introduced Palliative Medicine as specialized medical care for people living with serious illness. It  focuses on providing relief from the symptoms and stress of a serious illness.  We discussed a brief life review of the patient. Married to husband Roselie Awkward) for 21 years. She has two children. Roselie Awkward recalls her hospitalization in March and them finding likely metastatic cancer. At that time, patient declined further oncology workup and wished to focus on her comfort.   We discussed events leading up to hospitalization and hospital diagnoses/interventions. Roselie Awkward confirms their conversation with attending last night, speaking of Doaa declining further workup. He speaks of wanting "comfort and dignity" for her.   The difference between aggressive medical intervention and comfort care was considered in light of the patient's goals of care. Educated on hospice philosophy and goal of comfort, quality, and dignity at EOL. Educated on medications as needed for symptom management to ensure she is comfortable. He states "I don't want her to suffer." We discussed comfort feeds and allowing her to eat/drink what gives her joy. Husband is also requesting we continue PO medications for schizophrenia as long as she can take them.   We discussed prognosis of likely days-weeks with underlying metastatic cancer and now with acute renal failure, oliguria, and symptoms of uremia. Educated on EOL expectations. Husband confirms understanding.   Husband asks about a paracentesis prior to discharge to hospice, in hopes of providing some relief of abdominal pain. Patient agreeable with paracentesis for  comfort. Discussed with Dr. Jonnie Finner and IR order placed.   Discussed hospice options--home versus facility. Husband requesting to take her to hospice facility.   Roselie Awkward agreeable for me to give all family members an update regarding diagnoses, interventions, and prognosis. All questions and concerns were addressed. PMT contact information given. Emotional/spiritual support provided.     SUMMARY OF RECOMMENDATIONS     DNR/DNI  Comfort measures. Allow comfort feeds per patient request.   Symptom management--see below  IR consulted for palliative paracentesis.  SW consult for hospice facility. Husband requesting [employer] [facility].  Code Status/Advance Care Planning:  DNR  Symptom Management:   Tramadol 50-113m PO q12h prn moderate pain  Dilaudid 0.586mIV q2h prn severe pain, dyspnea, air hunger (Will avoid morphine with acute renal failure).   Ativan 0.21m74mV q4h prn anxiety  Robinul 0.2mg31m q4h prn secretions  Zofran 4mg 22mq6h prn nausea/vomiting  Continue PO Lexapro and Haldol as long as patient can swallow pills per husband request   Palliative Prophylaxis:   Aspiration, Delirium Protocol, Frequent Pain Assessment, Oral Care and Turn Reposition  Additional Recommendations (Limitations, Scope, Preferences):  Full Comfort Care  Psycho-social/Spiritual:   Desire for further Chaplaincy support: yes  Additional Recommendations: Caregiving  Support/Resources and Education on Hospice  Prognosis:   Likely days to weeks with metastatic cancer of unknown origin with CT revealing peritotneal carcinomatosis, recurrent malignant ascites, acute renal failure with uremia, hyperkalemia, and declining functional/cognitive/nutritional status. Patient/husband decline aggressive interventions/oncology workup and wish to pursue comfort measures.   Discharge Planning: Hospice facility      Primary Diagnoses: Present on Admission: . Acute renal failure (ARF) (HCC) [place] deep vein thrombosis (DVT) of popliteal vein of right lower extremity (HCC) . Cerebral infarction (HCC) [place] . Hyperlipidemia . Malignant ascites . Schizophrenia (HCC) [place] have reviewed the medical record, interviewed the patient and family, and examined the patient. The following aspects are pertinent.  Past Medical History:  Diagnosis Date  . CTS (carpal tunnel syndrome)    bilateral  . FATIGUE  08/27/2008  . GLUCOSE INTOLERANCE 06/25/2008  . HYPERLIPIDEMIA 06/25/2008  . HYPERTENSION 06/25/2008  . Impaired glucose tolerance 06/12/2010  . SCHIZOPHRENIA 06/25/2008   Social History   Socioeconomic History  . Marital status: Married    Spouse name: Not on file  . Number of children: 2  . Years of education: Not on file  . Highest education level: Not on file  Occupational History  . Occupation: disabled psych  Social Needs  . Financial resource strain: Not on file  . Food insecurity:    Worry: Not on file    Inability: Not on file  . Transportation needs:    Medical: Not on file    Non-medical: Not on file  Tobacco Use  . Smoking status: Former SmokeResearch scientist (life sciences)mokeless tobacco: Former User Systems developeruit date: 02/21/1976  . Tobacco comment: quit a long time ago;   Substance and Sexual Activity  . Alcohol use: No    Alcohol/week: 0.0 oz  . Drug use: No  . Sexual activity: Not on file  Lifestyle  . Physical activity:    Days per week: Not on file    Minutes per session: Not on file  . Stress: Not on file  Relationships  . Social connections:    Talks on phone: Not on file    Gets together: Not on file    Attends religious service: Not on file  Active member of club or organization: Not on file    Attends meetings of clubs or organizations: Not on file    Relationship status: Not on file  Other Topics Concern  . Not on file  Social History Narrative  . Not on file   Family History  Problem Relation Age of Onset  . Hypertension Other   . Stroke Other    Scheduled Meds: . benztropine  2 mg Oral Daily  . escitalopram  5 mg Oral Daily  . haloperidol  15 mg Oral Daily  . sodium chloride flush  3 mL Intravenous Q12H   Continuous Infusions: . sodium chloride     PRN Meds:.sodium chloride, acetaminophen **OR** acetaminophen, albuterol, glycopyrrolate, HYDROmorphone (DILAUDID) injection, LORazepam, ondansetron **OR** ondansetron (ZOFRAN) IV, sodium chloride flush,  traMADol Medications Prior to Admission:  Prior to Admission medications   Medication Sig Start Date End Date Taking? Authorizing Provider  aspirin EC 81 MG EC tablet Take 1 tablet (81 mg total) by mouth daily. 05/11/17  Yes Angiulli, Lavon Paganini, PA-C  benztropine (COGENTIN) 2 MG tablet Take 1 tablet (2 mg total) by mouth daily. 05/11/17  Yes Angiulli, Lavon Paganini, PA-C  escitalopram (LEXAPRO) 5 MG tablet Take 1 tablet (5 mg total) by mouth daily. 05/11/17  Yes Angiulli, Lavon Paganini, PA-C  haloperidol (HALDOL) 10 MG tablet Take 1.5 tablets (15 mg total) by mouth daily. Take 1 1/2 tab by mouth daily 05/11/17  Yes Angiulli, Lavon Paganini, PA-C  lovastatin (MEVACOR) 40 MG tablet Take 1 tablet (40 mg total) by mouth daily. 05/11/17  Yes Angiulli, Lavon Paganini, PA-C  potassium chloride (K-DUR,KLOR-CON) 10 MEQ tablet Take 10 mEq by mouth 2 (two) times daily.   Yes [provider]  rivaroxaban (XARELTO) 20 MG TABS tablet 20 mg daily beginning 05/31/2017 05/11/17  Yes Angiulli, Lavon Paganini, PA-C  traMADol (ULTRAM) 50 MG tablet Take 1-2 tablets (50-100 mg total) by mouth every 6 (six) hours as needed for moderate pain. 05/28/17  Yes Biagio Borg, MD  vitamin B-12 1000 MCG tablet Take 1 tablet (1,000 mcg total) by mouth daily. 05/11/17  Yes Angiulli, Lavon Paganini, PA-C  Rivaroxaban (XARELTO) 15 MG TABS tablet 15 mg twice daily through 05/30/2017 and stop Patient not taking: Reported on 06/13/2017 05/11/17   Cathlyn Parsons, PA-C   Allergies  Allergen Reactions  . Nabumetone Rash   Review of Systems  Constitutional:       C/o abdominal pain   Physical Exam  Constitutional: She is easily aroused. She appears ill.  drowsy  HENT:  Head: Normocephalic and atraumatic.  Cardiovascular: Regular rhythm.  Pulmonary/Chest: No accessory muscle usage. No tachypnea. No respiratory distress.  Abdominal: She exhibits distension and ascites. There is tenderness.  Neurological: She is easily aroused.  Wakes to voice, very drowsy   Skin: Skin is warm and dry.  Psychiatric: Her speech is delayed. She is inattentive.  Nursing note and vitals reviewed.  Vital Signs: BP 98/73 (BP Location: Right Arm)   Pulse (!) 109   Temp 97.6 F (36.4 C) (Oral)   Resp 14   Ht 5' 8"  (1.727 m)   Wt 81.6 kg (180 lb)   SpO2 95%   BMI 27.37 kg/m  Pain Scale: 0-10   Pain Score: Asleep   SpO2: SpO2: 95 % O2 Device:SpO2: 95 % O2 Flow Rate: .   IO: Intake/output summary:   Intake/Output Summary (Last 24 hours) at 06/14/2017 1629 Last data filed at 06/14/2017 1300 Gross per 24  hour  Intake 2060 ml  Output 200 ml  Net 1860 ml    LBM: Last BM Date: 06/12/17 Baseline Weight: Weight: 81.6 kg (180 lb) Most recent weight: Weight: 81.6 kg (180 lb)     Palliative Assessment/Data: PPS 20%   Flowsheet Rows     Most Recent Value  Intake Tab  Referral Department  Hospitalist  Unit at Time of Referral  Med/Surg Unit  Palliative Care Primary Diagnosis  Cancer  Palliative Care Type  Return patient Palliative Care  Reason for referral  Clarify Goals of Care, End of Life Care Assistance  Date first seen by Palliative Care  06/14/17  Clinical Assessment  Palliative Performance Scale Score  20%  Psychosocial & Spiritual Assessment  Palliative Care Outcomes  Patient/Family meeting held?  Yes  Who was at the meeting?  patient, husband  Palliative Care Outcomes  Clarified goals of care, Counseled regarding hospice, Improved pain interventions, Improved non-pain symptom therapy, Provided end of life care assistance, Changed to focus on comfort, Provided psychosocial or spiritual support, Transitioned to hospice      Time In: 1500 Time Out: 1620 Time Total: 78mn Greater than 50%  of this time was spent counseling and coordinating care related to the above assessment and plan.  Signed by:  MIhor Dow FNP-C Palliative Medicine Team  Phone: [phone]: [phone]  Please contact Palliative Medicine Team phone at  [phone] questions and concerns.  For individual provider: See AShea Evans

## 2017-06-14 NOTE — Procedures (Signed)
Ultrasound-guided therapeutic paracentesis performed yielding 4 liters (maximum ordered) of yellow fluid. No immediate complications.     

## 2017-06-14 NOTE — Progress Notes (Signed)
[facility] Progress Note  Subjective: no new c/o, lots of family in the room  Vitals:   06/14/17 1648 06/14/17 1653 06/14/17 1659 06/14/17 1705  BP: 101/64 (!) 91/57 (!) 98/59 (!) 97/57  Pulse:      Resp:      Temp:      TempSrc:      SpO2:      Weight:      Height:        Inpatient medications: . benztropine  2 mg Oral Daily  . escitalopram  5 mg Oral Daily  . haloperidol  15 mg Oral Daily  . lidocaine      . sodium chloride flush  3 mL Intravenous Q12H   . sodium chloride     sodium chloride, acetaminophen **OR** acetaminophen, albuterol, glycopyrrolate, HYDROmorphone (DILAUDID) injection, LORazepam, ondansetron **OR** ondansetron (ZOFRAN) IV, sodium chloride flush, traMADol  Exam: 1. General:  in No Acute distress  Chronically ill  -appearing 2. Psychological: Alert and  Oriented 3. Head/ENT:      Dry Mucous Membranes                          Head Non traumatic, neck supple                            Poor Dentition 4. SKIN:   decreased Skin turgor,  Skin clean Dry and intact no rash 5. Heart: Regular rate and rhythm no Murmur, no Rub or gallop 6. Lungs:  no wheezes or crackles   7. Abdomen: Soft, non-tender,   distended  bowel sounds present 8. Lower extremities: no clubbing, cyanosis, or edema 9. Neurologically Grossly intact, moving all 4 extremities equally  10. MSK: Normal range of motion     Brief Summary: [name] [name] is a 67 y.o. female with medical history significant of hypertension and schizophrenia HLD,  metastatic carcinoma unknown primary, ascites, embolic CVA who presented with similar abdominal pain and swelling, last paracentesis was 1 April.  Pt also endorsed worsening fatigue, abdomen has been gradually getting bigger, pain is worse with change in position and palpation better if she is still.  No associated chest pain which she is unable to take deep breaths secondary to abdominal swelling no fevers or chills. Occasional  vomiting.  Recently admitted on March 15 when patient presented with blurred vision and right-sided weakness, CT scan showed small bilateral occipital to early subacute infarctions. CT angio showed right ICA occlusion with reconstitution CT a which has showed chronic thrombus as well as some acute PE in the lungs bilaterally and bilateral pulmonary nodules worrisome for metastatic spread; also noted to have omental nodularity and the larger volume ascites. She underwent paracentesis on 17 March and had 3.8 L removed.  Also noted to have right lower extremity DVT.  MRI of the brain showed multiple acute to subacute infarcts felt to be secondary to embolic process.  Started on IV heparin.  Echocardiogram showed no evidence of PFO.  Cytology on peritoneal fluid for was + for metastatic cancer likely of gyn origin.  However, pt refused further workup choosing to undergo palliative care instead. Pt was admitted to inpatient rehab finally was discharged to family care on 24 March.  She was discharged on Xarelto plan to follow-up with neurology  Patient as a result of multiple strokes developed right hemiparesis and cognitive deficits. She undergone another paracentesis on 21 May 2017.  While in ER:  Noted to have new onset renal failure with hyperkalemia EKG changes she was started on calcium insulin albuterol and IV fluids         Impression/Plan: 1) Acute renal failure (ARF)  -unclear etiology possibly related to malignancy and ureteral obstruction.  At this point patient would like [place] to concentrate on comfort measures only.  Family at bedside.  They met w/ palliative care today and decided on one last paracentesis then will plan dc to a facility to be chosen yet for hospice care, probably will be dc'd tomorrow.  2) Recent acute deep vein thrombosis (DVT) of popliteal vein of right lower extremity - switch from Xarelto to Eliquis dose per pharmacy 3) Recent Cerebral infarction ([place]) in the setting of  possibly embolic events patient on anticoagulation 4) Essential hypertension hold off on home medications 5)  Hyperlipidemia hold off on statins 6)  Malignant ascites - management as needed and supportive management 7) Schizophrenia - continue home medications     DVT prophylaxis: as per pharmacy  Code Status:    DNR/DNI  comfort care as per patient family   Family Communication:   Family  at  Bedside  Disposition Plan: per palliative care rec's                          Consults called: palliative care  Admission status:    inpatient                 Kelly Splinter MD [facility] pgr [phone] 06/14/2017, 6:20 PM   Recent Labs  Lab 06/13/17 1556 06/14/17 0517  NA 135  --   K 7.3*  --   CL 105  --   CO2 18*  --   GLUCOSE 118*  --   BUN 77*  --   CREATININE 5.97* 5.69*  CALCIUM 9.2  --    Recent Labs  Lab 06/13/17 1556  AST 21  ALT 14  ALKPHOS 69  BILITOT 0.5  PROT 7.6  ALBUMIN 2.2*   Recent Labs  Lab 06/13/17 1556  WBC 11.4*  HGB 9.0*  HCT 26.8*  MCV 77.9*  PLT 420*   Iron/TIBC/Ferritin/ %Sat    Component Value Date/Time   IRON 16 (L) 05/04/2017 0457   TIBC 167 (L) 05/04/2017 0457   FERRITIN 528 (H) 05/04/2017 0457   IRONPCTSAT 10 (L) 05/04/2017 8850

## 2017-06-15 DIAGNOSIS — R188 Other ascites: Secondary | ICD-10-CM

## 2017-06-15 MED ORDER — ALBUTEROL SULFATE (2.5 MG/3ML) 0.083% IN NEBU: 2.5000 mg | INHALATION_SOLUTION | RESPIRATORY_TRACT | 12 refills | Status: AC | PRN

## 2017-06-15 NOTE — Progress Notes (Signed)
Report called to [facility], given to Dorothea Glassman RN

## 2017-06-15 NOTE — Progress Notes (Signed)
Call from Social Worker Elmyra Ricks to make RN aware that ambulance transportation has been called to transport patient. Made Spouse aware. Donne Hazel, RN

## 2017-06-15 NOTE — Progress Notes (Signed)
CSW following for discharge needs to [facility]. CSW made referral to liaison Olivia Mackie. BP liaison will follow up.   Kathrin Greathouse, Latanya Presser, MSW Clinical Social Worker  [phone] 06/15/2017  10:19 AM

## 2017-06-15 NOTE — Progress Notes (Signed)
Patient discharged to [facility], left with [facility], in stable condition. All belongings are with patient's spouse.

## 2017-06-15 NOTE — Clinical Social Work Note (Signed)
Clinical Social Work Assessment  Patient Details  Name: [name] [name] MRN: [id number] Date of Birth: 04-Nov-1950  Date of referral:  06/15/17               Reason for consult:  End of Life/Hospice                Permission sought to share information with:  Family Supports Permission granted to share information::  Yes, Verbal Permission Granted  Name::        Agency::  Residential Hospice   Relationship::  Spouse   Contact Information:     Housing/Transportation Living arrangements for the past 2 months:  Single Family Home Source of Information:  Spouse Patient Interpreter Needed:  None Criminal Activity/Legal Involvement Pertinent to Current Situation/Hospitalization:  No - Comment as needed Significant Relationships:  Spouse Lives with:  Spouse Do you feel safe going back to the place where you live?  No Need for family participation in patient care:  Yes (Comment)(End of life Choices )  Care giving concerns:   Terminally ill.   Social Worker assessment / plan:  CSW met with patient, spouse and sister at bedside. CSW explain role and reason for visit- to assist with patient discharge to residential hospice-[facility].  CSW explain the process to patient, BP liaison to meet with patient spouse and complete admission paperwork.   Physician informed of disharge. DNR signed.   [facility] to transport.   Plan: [facility].   Employment status:  Retired Forensic scientist:  Medicare PT Recommendations:  [place] / Referral to community resources:  [place]  Patient/Family's Response to care: Patient and family are ready to transition to [facility].   Patient/Family's Understanding of and Emotional Response to Diagnosis, Current Treatment, and Prognosis:  Patient alert to conversation but allowed her spouse to converse with CSW. Patient and family have good understanding of patient prognosis and transition to  comfort care.   Emotional Assessment Appearance:  Appears stated age Attitude/Demeanor/Rapport:    Affect (typically observed):  Accepting Orientation:  Oriented to Self, Oriented to Place Alcohol / Substance use:  Not Applicable Psych involvement (Current and /or in the community):  No (Comment)  Discharge Needs  Concerns to be addressed:  Discharge Planning Concerns, Grief and Loss Concerns Readmission within the last 30 days:  Yes Current discharge risk:  Terminally ill Barriers to Discharge:  No Barriers Identified   Lia Hopping, LCSW 06/15/2017, 11:10 AM

## 2017-06-15 NOTE — Progress Notes (Signed)
[facility] Liaison: ? Received a request from [place] for family interest in [facility], with request to transfer today. Chart reviewed. Met with husband, Roselie Awkward to confirm interest and explain services. Family agreeable to transfer today. CSW aware and transportation scheduled. Registration paperwork completed. ? RN please call report to [phone]. ? Thank You,  Freddi Starr RN, [facility] Liaison [phone]

## 2017-06-15 NOTE — Progress Notes (Signed)
[facility] called for transport.  Med. Nes. Form completer. DNR placed.   Kathrin Greathouse, Latanya Presser, MSW Clinical Social Worker  [phone] 06/15/2017  12:10 PM

## 2017-06-15 NOTE — Discharge Summary (Signed)
Physician Discharge Summary   Patient ID: [name] [name] MRN: [id number] DOB/AGE: 1950-09-27 67 y.o.  Admit date: 06/13/2017 Discharge date: 06/15/2017  Primary Care Physician:  Biagio Borg, MD   Recommendations for Outpatient Follow-up:  Comfort care  Home Health: None  Discharge Condition: Guarded prognosis, patient being transferred to residential hospice CODE STATUS: DNR   Diet recommendation: Comfort feeds   Discharge Diagnoses:    . Acute renal failure (ARF) ([place]) . Acute deep vein thrombosis (DVT) of popliteal vein of right lower extremity (HCC)   Malignant ascites status post palliative paracentesis  . Recent cerebral infarction [facility]) in the setting of possible embolic events . Essential hypertension . Hyperlipidemia . Recent acute DVT of popliteal vein RLE . 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 Nabumetone Rash     DISCHARGE MEDICATIONS: Allergies as of 06/15/2017      Reactions   Nabumetone Rash      Medication List    STOP taking these medications   aspirin 81 MG EC tablet   cyanocobalamin 1000 MCG tablet   lovastatin 40 MG tablet Commonly known as:  MEVACOR   potassium chloride 10 MEQ tablet Commonly known as:  K-DUR,KLOR-CON   Rivaroxaban 15 MG Tabs tablet Commonly known as:  XARELTO   rivaroxaban 20 MG Tabs tablet Commonly known as:  XARELTO     TAKE these medications   albuterol (2.5 MG/3ML) 0.083% nebulizer solution Commonly known as:  PROVENTIL Take 3 mLs (2.5 mg total) by nebulization every 2 (two) hours as needed for wheezing.   benztropine 2 MG tablet Commonly known as:  COGENTIN Take 1 tablet (2 mg total) by mouth daily.   escitalopram 5 MG tablet Commonly known as:  LEXAPRO Take 1 tablet (5 mg total) by mouth daily.   haloperidol 10 MG tablet Commonly known as:  HALDOL Take 1.5 tablets (15 mg total) by  mouth daily. Take 1 1/2 tab by mouth daily   traMADol 50 MG tablet Commonly known as:  ULTRAM Take 1-2 tablets (50-100 mg total) by mouth every 6 (six) hours as needed for moderate pain.        Brief H and P: For complete details please refer to admission H and P, but in brief  [name] [name] a 67 y.o.femalewith medical history significant of hypertension and schizophreniaHLD, metastatic carcinoma unknown primary, ascites, embolic CVA who presented withsimilar abdominal pain and swelling, last paracentesis was 1 April.  Pt also endorsed worsening fatigue, abdomen has been gradually getting bigger, pain is worse with change in position and palpation better if she is still. No associated chest pain which she is unable to take deep breaths secondary to abdominal swelling no fevers or chills. Occasional vomiting. Recently admitted on March 15 when patient presented with blurred vision and right-sided weakness, CT scan showed small bilateral occipital to early subacute infarctions. CT angio showed right ICA occlusion with reconstitution CT a which has showed chronic thrombus as well as some acute PE in the lungs bilaterally and bilateral pulmonary nodules worrisome for metastatic spread; also noted to have omental nodularity and the larger volume ascites. She underwent paracentesis on 17 March and had 3.8 L removed.  Also noted to have right lower extremity DVT. MRI of the brain showed multiple acute to subacute infarcts felt to be secondary to embolic process. Started on IV heparin.Echocardiogram showed no evidence of PFO.  Cytology on peritoneal fluid for was + for metastatic cancer likely of gyn origin.  However, pt refused further workup choosing to undergo palliative care instead. Pt was admitted to inpatient rehab finally was discharged to family care on 24 March.She was discharged on Xarelto plan to follow-up with neurology Patient as a result of multiple strokes developed right  hemiparesis and cognitive deficits. She undergone another paracentesis on 21 May 2017. While in ER:  Noted to have new onset renal failure with hyperkalemia EKG changes she was started on calcium insulin albuterol and IV fluids    Hospital Course:   Acute kidney injury: Likely due to malignancy and ureteral obstruction from peritoneal carcinomatosis -Palliative medicine was consulted, family and the patient has decided at this point to concentrate on comfort measures only  Malignant ascites in the setting of peritoneal carcinomatosis -IR was consulted and patient underwent palliative paracentesis, yielding 4 L of yellow fluid  Recent acute DVT of popliteal vein RLE -Patient was continued on anticoagulation while inpatient until patient and family decided for complete comfort care status and anti-correlation was discontinued.  Recent CVA -Comfort care status  Schizophrenia -Continue home medication including Haldol, Cogentin, Lexapro  Metastatic cancer with peritoneal carcinomatosis -Peritoneal fluid positive for malignant cells likely of GYN origin.  Patient underwent palliative paracentesis yielding 4 L on 4/25.  Patient and her husband has not declined aggressive interventions or any further oncologic work-up, wants to pursue comfort measures only, prognosis days to weeks. -Patient accepted at residential hospice.  Day of Discharge S: Feels okay, no pain, denies any nausea or vomiting.  No fevers  BP (!) 103/55 (BP Location: Left Arm)   Pulse (!) 113   Temp 100.2 F (37.9 C) (Oral)   Resp 16   Ht 5\' 8"  (1.727 m)   Wt 81.6 kg (180 lb)   SpO2 96%   BMI 27.37 kg/m   Physical Exam: General: Alert and awake oriented, NAD HEENT: anicteric sclera, pupils reactive to light and accommodation CVS: S1-S2 clear no murmur rubs or gallops Chest: clear to auscultation bilaterally, no wheezing rales or rhonchi Abdomen: soft, distended normal bowel sounds Extremities: no cyanosis,  clubbing or edema noted bilaterally Neuro: no new deficits   The results of significant diagnostics from this hospitalization (including imaging, microbiology, ancillary and laboratory) are listed below for reference.      Procedures/Studies:  Ct Abdomen Pelvis Wo Contrast  Result Date: 06/13/2017 CLINICAL DATA:  Abdominal pain and swelling. Gradually getting bigger. Abdomen was drained 3 weeks ago. EXAM: CT ABDOMEN AND PELVIS WITHOUT CONTRAST TECHNIQUE: Multidetector CT imaging of the abdomen and pelvis was performed following the standard protocol without IV contrast. COMPARISON:  CT chest 05/04/2017 FINDINGS: Lower chest: Motion artifact limits evaluation. No focal consolidation. Several nodules demonstrated in the right lung base, largest measuring 6 mm diameter. No change since previous study. Hepatobiliary: No focal liver abnormality is seen. No gallstones, gallbladder wall thickening, or biliary dilatation. Pancreas: Unremarkable. No pancreatic ductal dilatation or surrounding inflammatory changes. Spleen: Normal in size without focal abnormality. Adrenals/Urinary Tract: No adrenal gland nodules. Prominent atrophy of the left kidney. No hydronephrosis in either kidney. No renal or ureteral stones identified. Bladder wall is thickened, suggesting possible cystitis. No bladder stones identified. Stomach/Bowel: Stomach is within normal limits. Appendix appears normal. No evidence of bowel wall thickening, distention, or inflammatory changes. Vascular/Lymphatic: Minimal scattered aortic calcification. No aneurysm. Prominent retroperitoneal lymph nodes, measuring up to about 12 mm short axis dimension. Prominent lymph nodes in  the groins. Left groin dominant lymph node measures about 1.4 cm diameter. These are nonspecific but could indicate metastatic lesions. Reproductive: Uterus and ovaries are not enlarged. Focal increased density within the uterine myometrium likely to represent a small calcified  fibroid. Other: Prominent diffuse abdominal and pelvic ascites resulting in severe abdominal distention. No free air. Suggestion of nodularity in the omentum could indicate carcinomatosis. Correlation with cytology from paracentesis done on 05/06/2017 indicates malignant cells present consistent with carcinoma suggestive of gynecologic primary. Abdominal wall musculature appears intact. Musculoskeletal: Degenerative changes in the spine. No destructive bone lesions. IMPRESSION: 1. Abdominal distention due to prominent ascites diffusely throughout the abdomen and pelvis. Omental nodularity consistent with peritoneal carcinomatosis. 2. Right lung base nodules measuring up to 6 mm in diameter. No change since previous study. Possible metastatic disease. 3. Moderately prominent lymph nodes in the retroperitoneum and groin regions, possibly metastatic. 4. Uterus and ovaries are not enlarged. No definite primary lesion identified. Consider follow-up with ultrasound or MRI after paracentesis. 5. Asymmetric left renal atrophy. 6. Bladder wall thickening suggesting cystitis. 7. Aortic atherosclerosis. Electronically Signed   By: Lucienne Capers M.D.   On: 06/13/2017 21:42   US Paracentesis  Result Date: 06/14/2017 INDICATION: Patient with history of metastatic carcinoma of unknown origin, recurrent malignant ascites. Request made for therapeutic paracentesis up to 3-4 liters. EXAM: ULTRASOUND GUIDED THERAPEUTIC PARACENTESIS MEDICATIONS: None COMPLICATIONS: None immediate. PROCEDURE: Informed written consent was obtained from the patient after a discussion of the risks, benefits and alternatives to treatment. A timeout was performed prior to the initiation of the procedure. Initial ultrasound scanning demonstrates a moderate to large amount of ascites within the left mid to lower abdominal quadrant. The left mid to lower abdomen was prepped and draped in the usual sterile fashion. 1% lidocaine was used for local  anesthesia. Following this, a 19 gauge, 7-cm, Yueh catheter was introduced. An ultrasound image was saved for documentation purposes. The paracentesis was performed. The catheter was removed and a dressing was applied. The patient tolerated the procedure well without immediate post procedural complication. FINDINGS: A total of approximately 4 liters of yellow fluid was removed. IMPRESSION: Successful ultrasound-guided therapeutic paracentesis yielding 4 liters of peritoneal fluid. Read by: Rowe Robert, PA-C Electronically Signed   By: Jacqulynn Cadet M.D.   On: 06/14/2017 17:01   US Paracentesis  Result Date: 05/21/2017 INDICATION: Patient with history of recurrent malignant ascites. Request made for therapeutic paracentesis up to 5 liters. EXAM: ULTRASOUND GUIDED THERAPEUTIC PARACENTESIS MEDICATIONS: None COMPLICATIONS: None immediate. PROCEDURE: Informed written consent was obtained from the patient after a discussion of the risks, benefits and alternatives to treatment. A timeout was performed prior to the initiation of the procedure. Initial ultrasound scanning demonstrates a large amount of ascites within the left lower abdominal quadrant. The left lower abdomen was prepped and draped in the usual sterile fashion. 1% lidocaine was used for local anesthesia. Following this, a 19 gauge, 7-cm, Yueh catheter was introduced. An ultrasound image was saved for documentation purposes. The paracentesis was performed. The catheter was removed and a dressing was applied. The patient tolerated the procedure well without immediate post procedural complication. FINDINGS: A total of approximately 4.8 liters of hazy, yellow fluid was removed. IMPRESSION: Successful ultrasound-guided therapeutic paracentesis yielding 4.8 liters of peritoneal fluid. Read by: Rowe Robert, PA-C Electronically Signed   By: Corrie Mckusick D.O.   On: 05/21/2017 15:37       LAB RESULTS: Basic Metabolic Panel: Recent Labs  Lab  06/13/17 1556 06/14/17 0517  NA 135  --   K 7.3*  --   CL 105  --   CO2 18*  --   GLUCOSE 118*  --   BUN 77*  --   CREATININE 5.97* 5.69*  CALCIUM 9.2  --    Liver Function Tests: Recent Labs  Lab 06/13/17 1556  AST 21  ALT 14  ALKPHOS 69  BILITOT 0.5  PROT 7.6  ALBUMIN 2.2*   Recent Labs  Lab 06/13/17 1556  LIPASE 27   No results for input(s): AMMONIA in the last 168 hours. CBC: Recent Labs  Lab 06/13/17 1556  WBC 11.4*  HGB 9.0*  HCT 26.8*  MCV 77.9*  PLT 420*   Cardiac Enzymes: No results for input(s): CKTOTAL, CKMB, CKMBINDEX, TROPONINI in the last 168 hours. BNP: Invalid input(s): POCBNP CBG: No results for input(s): GLUCAP in the last 168 hours.    Disposition and Follow-up:    DISPOSITION: [facility]   [place]    Biagio Borg, MD Follow up.   Specialties:  Internal Medicine, Radiology Why:  AS NEEDED  Contact information: [address] [place] [address] [phone]            Time coordinating discharge:  25 MINS   Signed:   Estill Cotta M.D. [facility] 06/15/2017, 11:18 AM Pager: [phone]

## 2017-06-18 ENCOUNTER — Telehealth: Payer: Self-pay | Admitting: Internal Medicine

## 2017-06-18 ENCOUNTER — Ambulatory Visit: Payer: Medicare Other | Admitting: Internal Medicine

## 2017-06-18 NOTE — Telephone Encounter (Signed)
Forms done.

## 2017-06-18 NOTE — Telephone Encounter (Signed)
I have received FMLA for patients daughter Faustino Congress to be completed for caring for the patient.   Patient has an appointment today 06/18/17 @1 :40p.   I have completed everything on the forms but the frequency is not.  Forms have been giving to the provider for the appointment to review and complete.  Thank you.

## 2017-06-18 NOTE — Telephone Encounter (Signed)
Forms have been faxed, copy sent to scan & charged for under the daughter, Faustino Congress.   Original up front for patient to pick up!

## 2017-06-20 ENCOUNTER — Ambulatory Visit: Payer: Medicare Other | Admitting: Hematology and Oncology

## 2017-06-26 ENCOUNTER — Ambulatory Visit: Payer: Medicare Other | Admitting: Adult Health

## 2017-06-26 NOTE — Progress Notes (Deleted)
[facility] [address] [place] [address] [phone]       OFFICE FOLLOW UP NOTE  [name] Date of Birth:  03-14-1950 Medical Record Number:  [id number]   Reason for Referral:  hospital *** follow up  CHIEF COMPLAINT:  No chief complaint on file.   HPI: [name] [name] is being seen today for initial visit in the office for *** on ***. History obtained from *** and chart review. Reviewed all radiology images and labs personally.  [name] is a 67 y.o. female with history of hypertension, hyperlipidemia, schizophrenia admitted for transient right-sided weakness, blurry vision and dizziness. No tPA given due to out of window.  CT head reviewed and showed small bilateral occipital acute early subacute infarct without hemorrhage or mass-effect.   MRI brain reviewed and showed bilateral supra and infra patchy and punctate tentorial infarcts.  CTA head and neck showed right ICA occlusion consistent with thrombus of right ICA bifurcation.  TCD bubble study negative.  2D echo showed an EF of 50 to 55%.  LDL 72 and A1c 6.2.  Lower extremity venous Doppler showed right lower extremity DVT and CTA chest showed acute and chronic PE with submassive PE.  As patient does have a history of abdominal and lung metastatic cancer stroke was likely due to hypercoagulable state secondary to advanced malignancy.  It was recommended for patient to undergo pan CT to further eval of malignancy and requested oncology consult. Recommended pravastatin for cholesterol management. Per hospital notes, patient refused to undergo further workup for metastatic cancer and chose to undergo with palliative care. Recommended discharge on Xarelto and pravastatin for secondary stroke prevention. Patient was discharged to inpatient rehab for right hemiparesis and cognitive deficits.        ROS:   14 system review of systems performed and negative with exception of ***  PMH:  Past  Medical History:  Diagnosis Date  . CTS (carpal tunnel syndrome)    bilateral  . FATIGUE 08/27/2008  . GLUCOSE INTOLERANCE 06/25/2008  . HYPERLIPIDEMIA 06/25/2008  . HYPERTENSION 06/25/2008  . Impaired glucose tolerance 06/12/2010  . SCHIZOPHRENIA 06/25/2008    PSH:  Past Surgical History:  Procedure Laterality Date  . DILATION AND CURETTAGE OF UTERUS  1999   hx of after miscarriage  . s/p cervical procedure  1995   LEEP    Social History:  Social History   Socioeconomic History  . Marital status: Married    Spouse name: Not on file  . Number of children: 2  . Years of education: Not on file  . Highest education level: Not on file  Occupational History  . Occupation: disabled psych  Social Needs  . Financial resource strain: Not on file  . Food insecurity:    Worry: Not on file    Inability: Not on file  . Transportation needs:    Medical: Not on file    Non-medical: Not on file  Tobacco Use  . Smoking status: Former Research scientist (life sciences)  . Smokeless tobacco: Former Systems developer    Quit date: 02/21/1976  . Tobacco comment: quit a long time ago;   Substance and Sexual Activity  . Alcohol use: No    Alcohol/week: 0.0 oz  . Drug use: No  . Sexual activity: Not on file  Lifestyle  . Physical activity:    Days per week: Not on file    Minutes per session: Not on file  . Stress: Not on file  Relationships  . Social connections:    Talks on phone: Not on file    Gets together: Not on file    Attends religious service: Not on file    Active member of club or organization: Not on file    Attends meetings of clubs or organizations: Not on file    Relationship status: Not on file  . Intimate partner violence:    Fear of current or ex partner: Not on file    Emotionally abused: Not on file    Physically abused: Not on file    Forced sexual activity: Not on file  Other Topics Concern  . Not on file  Social History Narrative  . Not on file    Family History:  Family History  Problem  Relation Age of Onset  . Hypertension Other   . Stroke Other     Medications:   Current Outpatient Medications on File Prior to Visit  Medication Sig Dispense Refill  . albuterol (PROVENTIL) (2.5 MG/3ML) 0.083% nebulizer solution Take 3 mLs (2.5 mg total) by nebulization every 2 (two) hours as needed for wheezing. 75 mL 12  . benztropine (COGENTIN) 2 MG tablet Take 1 tablet (2 mg total) by mouth daily. 90 tablet 3  . escitalopram (LEXAPRO) 5 MG tablet Take 1 tablet (5 mg total) by mouth daily. 30 tablet 0  . haloperidol (HALDOL) 10 MG tablet Take 1.5 tablets (15 mg total) by mouth daily. Take 1 1/2 tab by mouth daily 60 tablet 0  . traMADol (ULTRAM) 50 MG tablet Take 1-2 tablets (50-100 mg total) by mouth every 6 (six) hours as needed for moderate pain. 60 tablet 1   No current facility-administered medications on file prior to visit.     Allergies:   Allergies  Allergen Reactions  . Nabumetone Rash     Physical Exam  There were no vitals filed for this visit. There is no height or weight on file to calculate BMI. No exam data present  General: well developed, well nourished, seated, in no evident distress Head: head normocephalic and atraumatic.   Neck: supple with no carotid or supraclavicular bruits Cardiovascular: regular rate and rhythm, no murmurs Musculoskeletal: no deformity Skin:  no rash/petichiae Vascular:  Normal pulses all extremities  Neurologic Exam Mental Status: Awake and fully alert. Oriented to place and time. Recent and remote memory intact. Attention span, concentration and fund of knowledge appropriate. Mood and affect appropriate.  No flowsheet data found. Cranial Nerves: Fundoscopic exam reveals sharp disc margins. Pupils equal, briskly reactive to light. Extraocular movements full without nystagmus. Visual fields full to confrontation. Hearing intact. Facial sensation intact. Face, tongue, palate moves normally and symmetrically.  Motor: Normal bulk  and tone. Normal strength in all tested extremity muscles. Sensory.: intact to touch , pinprick , position and vibratory sensation.  Coordination: Rapid alternating movements normal in all extremities. Finger-to-nose and heel-to-shin performed accurately bilaterally. Gait and Station: Arises from chair without difficulty. Stance is normal. Gait demonstrates normal stride length and balance . Able to heel, toe and tandem walk without difficulty.  Reflexes: 1+ and symmetric. Toes downgoing.    NIHSS  *** Modified Rankin  *** HAS-BLED *** CHA2DS2-VASc ***   Diagnostic Data (Labs, Imaging, Testing)   ASSESSMENT: [name] [name] is a 67 y.o. year old female here with *** on *** secondary to ***. Vascular risk factors include ***.     PLAN: -Continue {anticoagulants:31417}  and ***  for secondary stroke prevention -F/u with PCP  regarding your *** management -continue to monitor BP at home  -Maintain strict control of hypertension with blood pressure goal below 130/90, diabetes with hemoglobin A1c goal below 6.5% and cholesterol with LDL cholesterol (bad cholesterol) goal below 70 mg/dL. I also advised the patient to eat a healthy diet with plenty of whole grains, cereals, fruits and vegetables, exercise regularly and maintain ideal body weight.  Follow up in *** or call earlier if needed   Greater than 50% time during this *** minute consultation visit was spent on counseling and coordination of care about ***, and ***, discussion about risk benefit of anticoagulation and answering questions.     Venancio Poisson, AGNP-BC  [facility] [address] [place], [place] [address]  Phone [phone] Fax [phone]

## 2017-06-27 ENCOUNTER — Encounter: Payer: Self-pay | Admitting: Adult Health

## 2017-06-27 ENCOUNTER — Telehealth: Payer: Self-pay

## 2017-06-27 NOTE — Telephone Encounter (Signed)
PT no show for appt in hospice 06/26/2017.

## 2017-07-05 ENCOUNTER — Ambulatory Visit: Payer: Medicare Other | Admitting: Physical Medicine & Rehabilitation

## 2017-07-21 DEATH — deceased

## 2017-09-17 ENCOUNTER — Telehealth: Payer: Self-pay | Admitting: Internal Medicine

## 2017-09-17 NOTE — Telephone Encounter (Signed)
Was able to locate obituary as well, pt flagged in [place] as deceased and future appt with Dr. Jenny Reichmann cancelled.

## 2017-09-17 NOTE — Telephone Encounter (Signed)
Copied from [place] [id number]. Topic: General - Other >> Sep 17, 2017 11:19 AM Burchel, Abbi R wrote: Reason for CRM:   Pt's daughter called to let PCP know that pt passed away in 07/20/2017.

## 2017-09-27 ENCOUNTER — Encounter: Payer: Medicare Other | Admitting: Internal Medicine

## 2017-09-27 ENCOUNTER — Ambulatory Visit: Payer: Medicare Other | Admitting: Internal Medicine

## 2018-10-19 IMAGING — US US PARACENTESIS
1 series · 8 of 8 positions shown · non-contrast
Comparison: none

INDICATION: Patient with history of recurrent malignant ascites. Request made
for therapeutic paracentesis up to 5 liters.

[Series 1: us paracentesis · 8 of 8 slices shown]
[im 1/8]
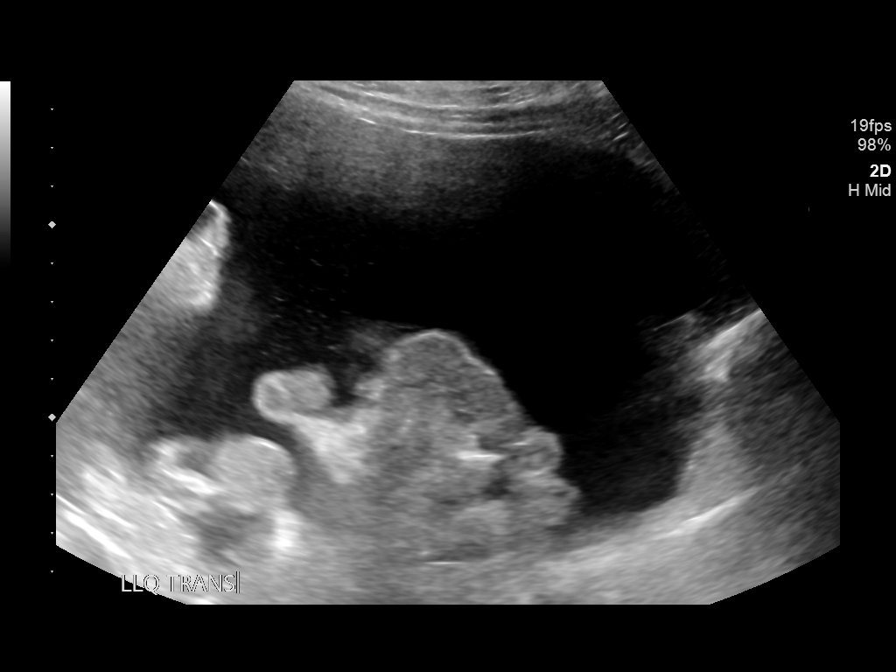
[im 2/8]
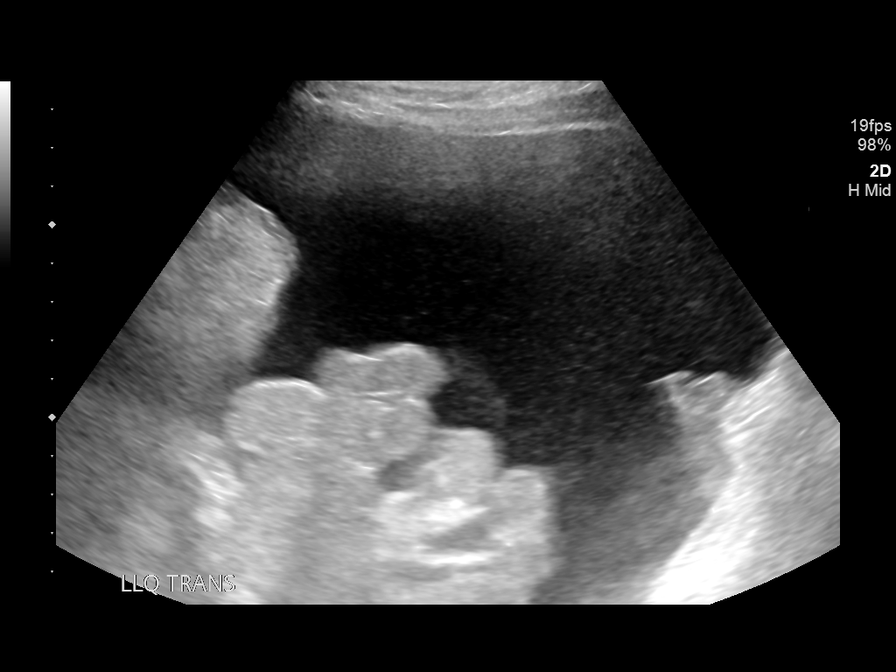
[im 3/8]
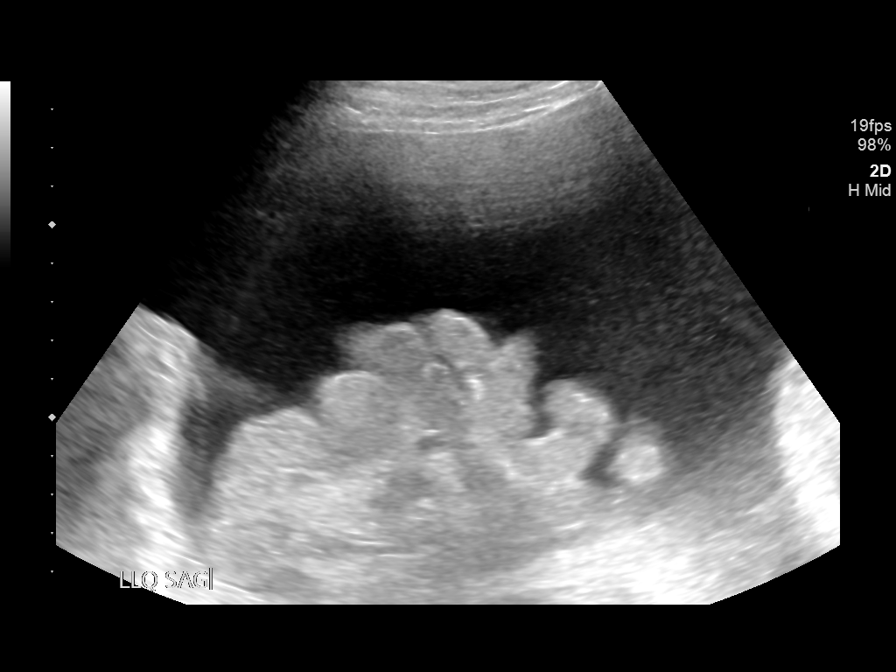
[im 4/8]
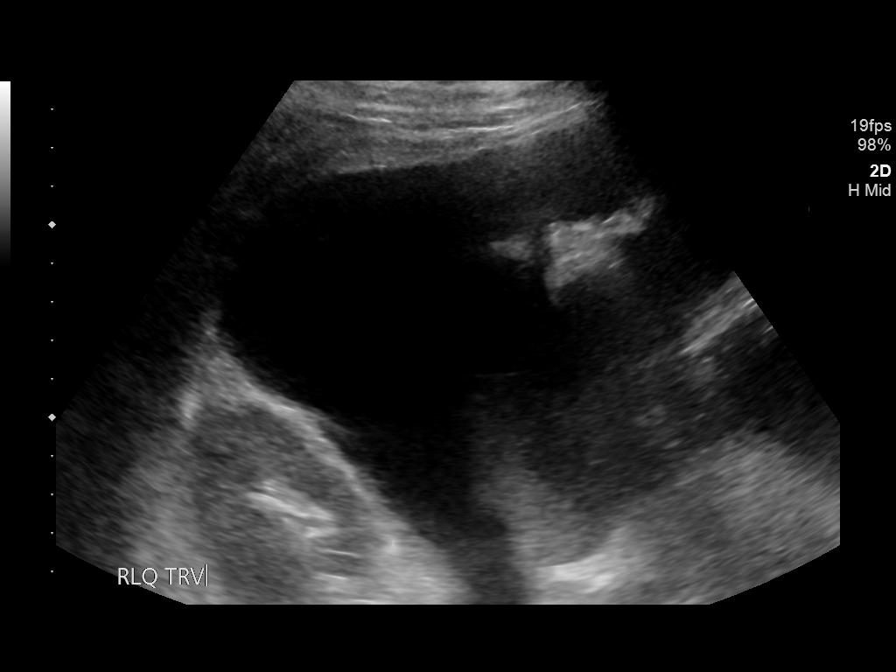
[im 5/8]
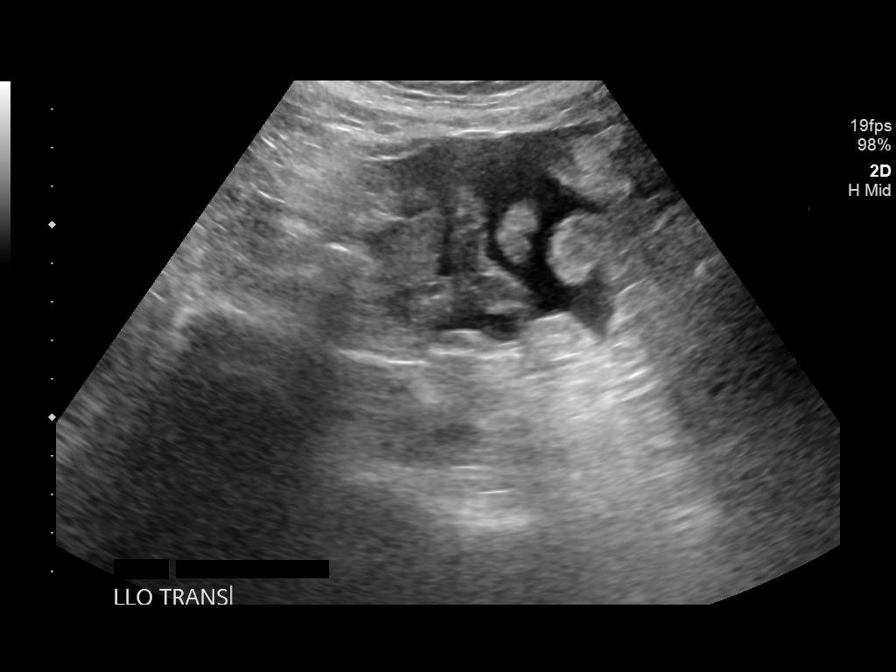
[im 6/8]
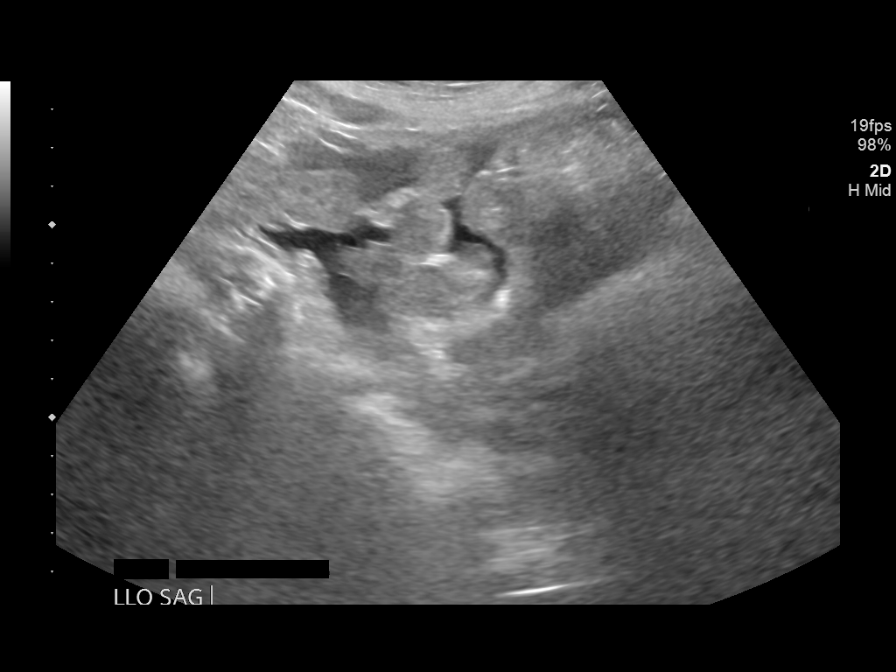
[im 7/8]
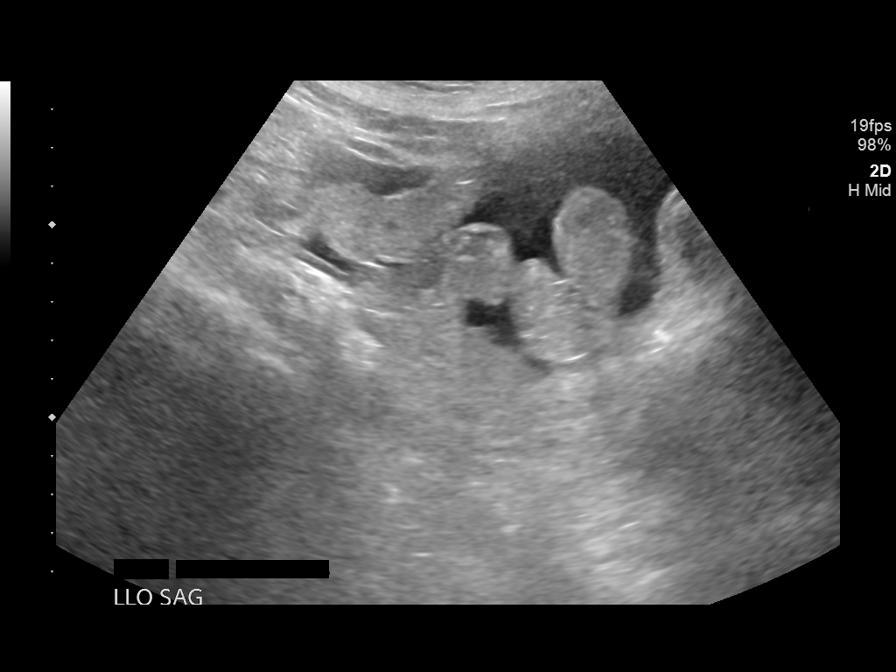
[im 8/8]
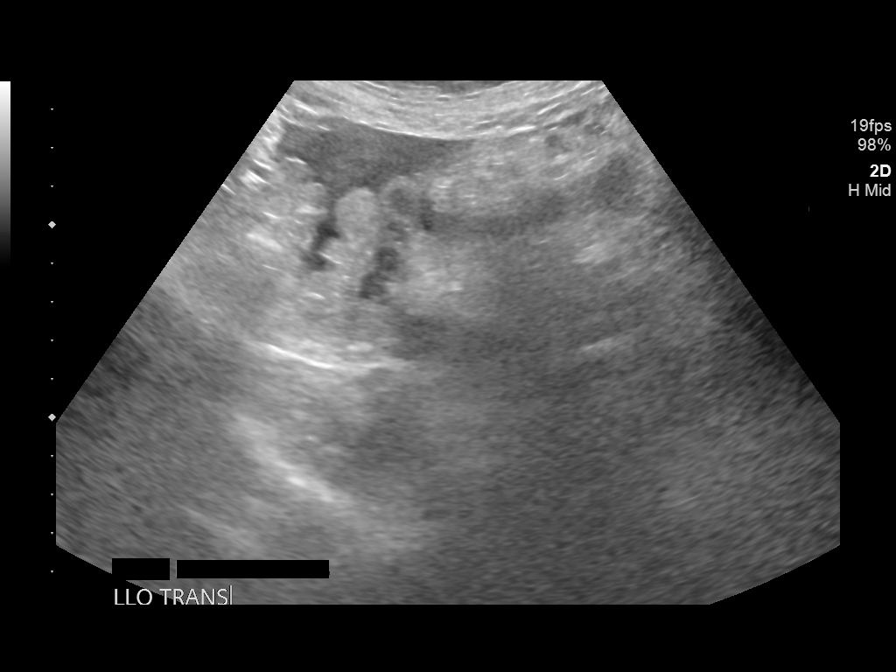

[8 of 8 positions shown; findings below may reference images not displayed]

EXAM:
ULTRASOUND GUIDED THERAPEUTIC PARACENTESIS

MEDICATIONS:
None

COMPLICATIONS:
None immediate.

PROCEDURE:
Informed written consent was obtained from the patient after a
discussion of the risks, benefits and alternatives to treatment. A
timeout was performed prior to the initiation of the procedure.

Initial ultrasound scanning demonstrates a large amount of ascites
within the left lower abdominal quadrant. The left lower abdomen was
prepped and draped in the usual sterile fashion. 1% lidocaine was
used for local anesthesia.

Following this, a 19 gauge, 7-cm, Yueh catheter was introduced. An
ultrasound image was saved for documentation purposes. The
paracentesis was performed. The catheter was removed and a dressing
was applied. The patient tolerated the procedure well without
immediate post procedural complication.
FINDINGS: A total of approximately 4.8 liters of hazy, yellow fluid was
removed.
IMPRESSION: Successful ultrasound-guided therapeutic paracentesis yielding
liters of peritoneal fluid.

## 2018-11-11 IMAGING — CT CT ABD-PELV W/O CM
2 of 4 series · 15 of 46 positions shown, 17 images · non-contrast
Comparison: CT chest 05/04/2017

CLINICAL DATA: Abdominal pain and swelling. Gradually getting
bigger. Abdomen was drained 3 weeks ago.

EXAM:
CT ABDOMEN AND PELVIS WITHOUT CONTRAST
TECHNIQUE: Multidetector CT imaging of the abdomen and pelvis was performed
following the standard protocol without IV contrast.

[Series 2: axial st · axial · 0.75mm/px · z∈[-786,-326]mm · 12 of 104 slices shown, 14 images]
[im 6/104  soft-tissue]
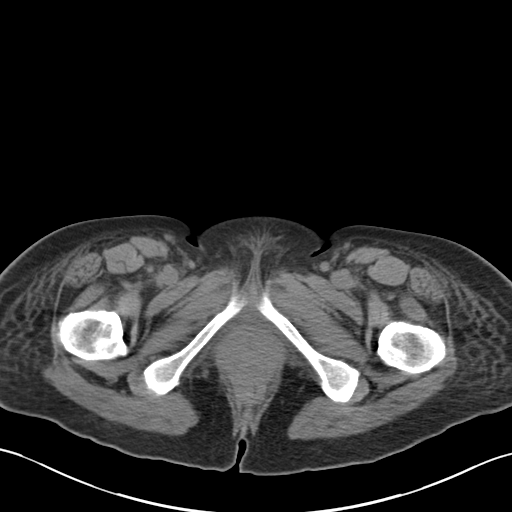
[im 6/104  bone]
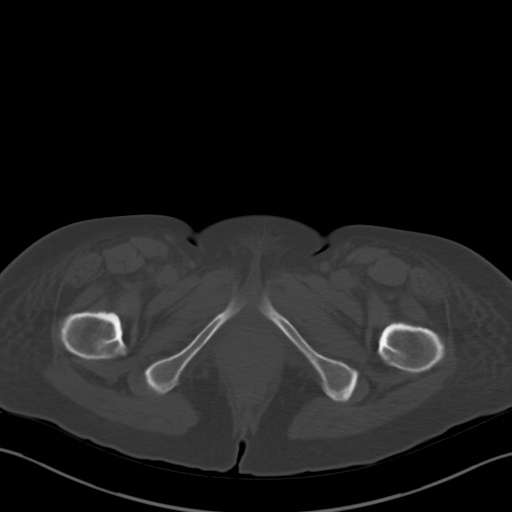
[im 17/104  soft-tissue]
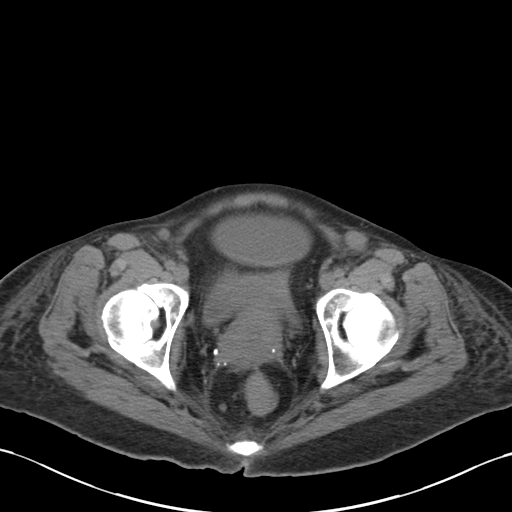
[im 22/104  soft-tissue]
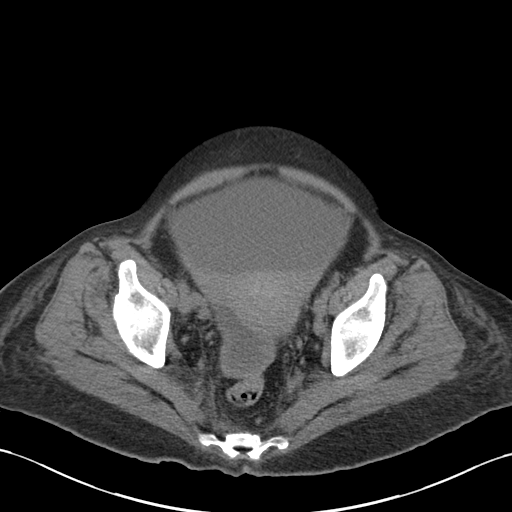
[im 33/104  soft-tissue]
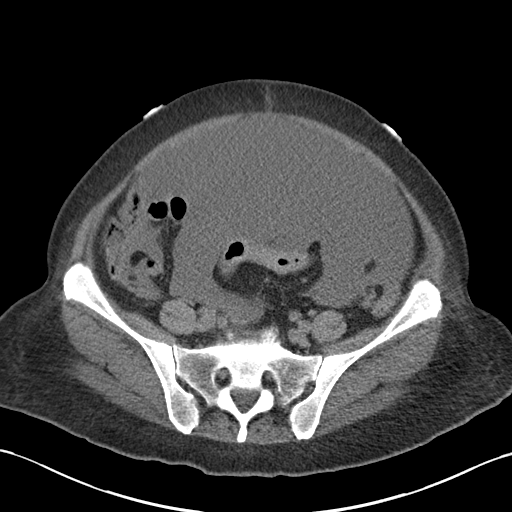
[im 38/104  soft-tissue]
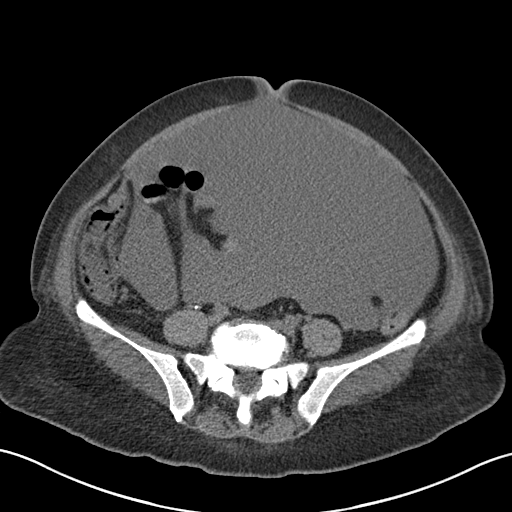
[im 49/104  soft-tissue]
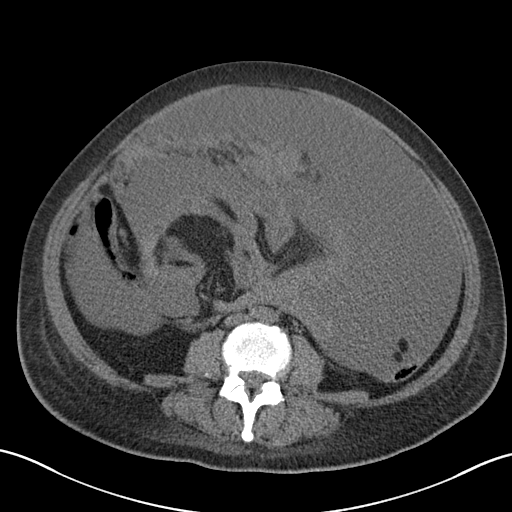
[im 55/104  soft-tissue]
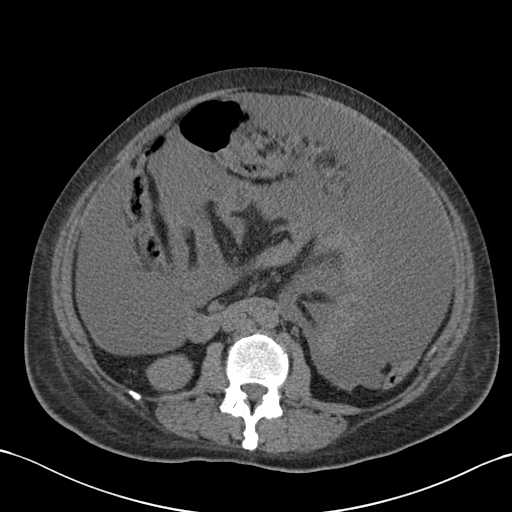
[im 66/104  soft-tissue]
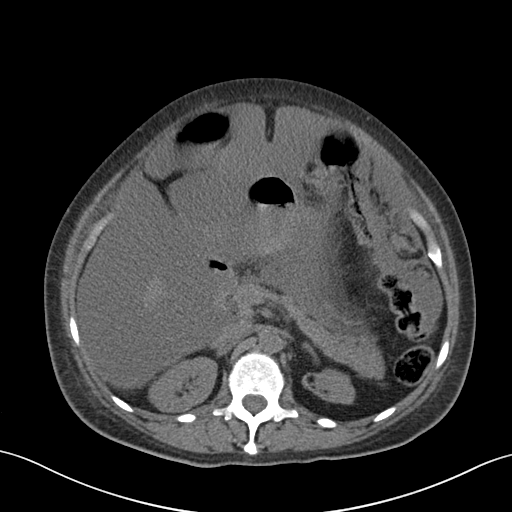
[im 71/104  soft-tissue]
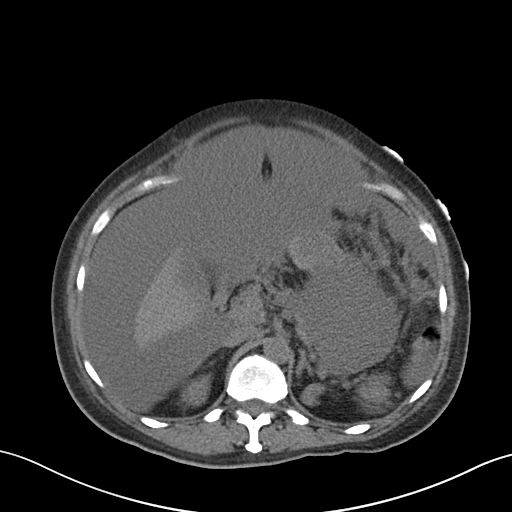
[im 71/104  bone]
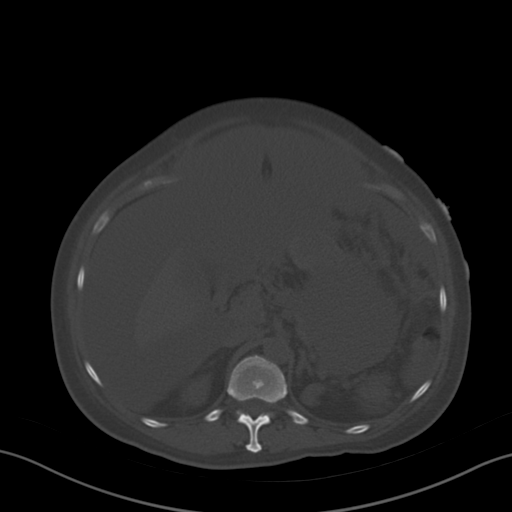
[im 82/104  soft-tissue]
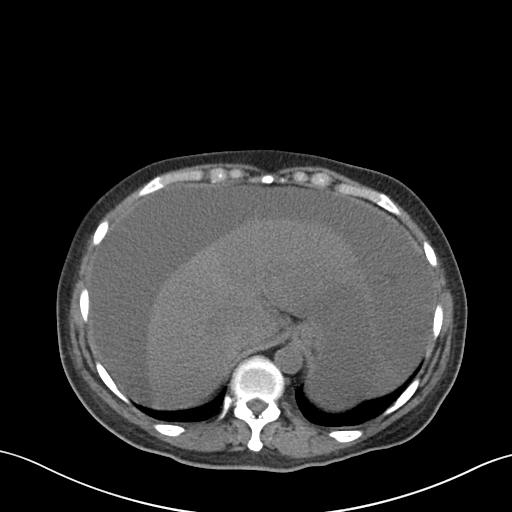
[im 87/104  soft-tissue]
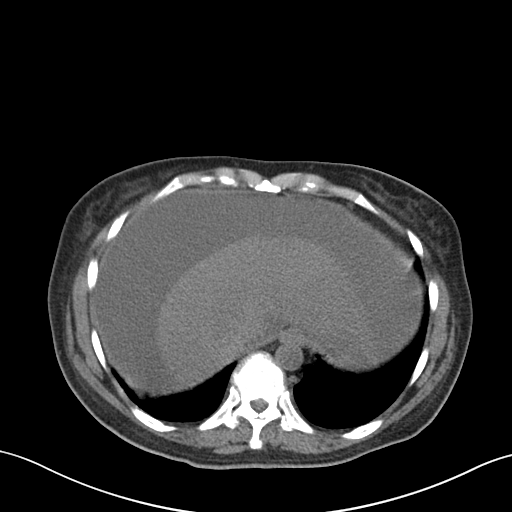
[im 98/104  soft-tissue]
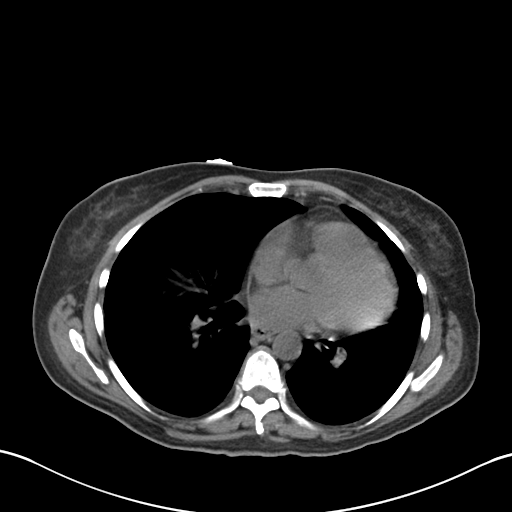

[Series 5: coronal st · coronal · 0.74mm/px · 3 of 109 slices shown]
[im 37/109  soft-tissue]
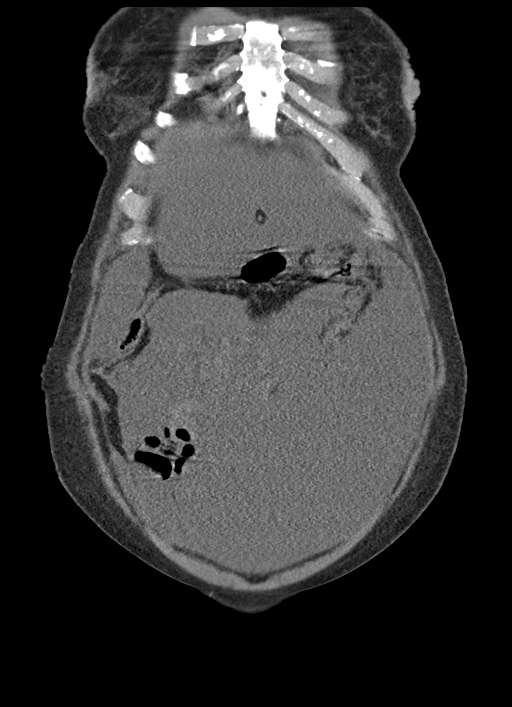
[im 49/109  soft-tissue]
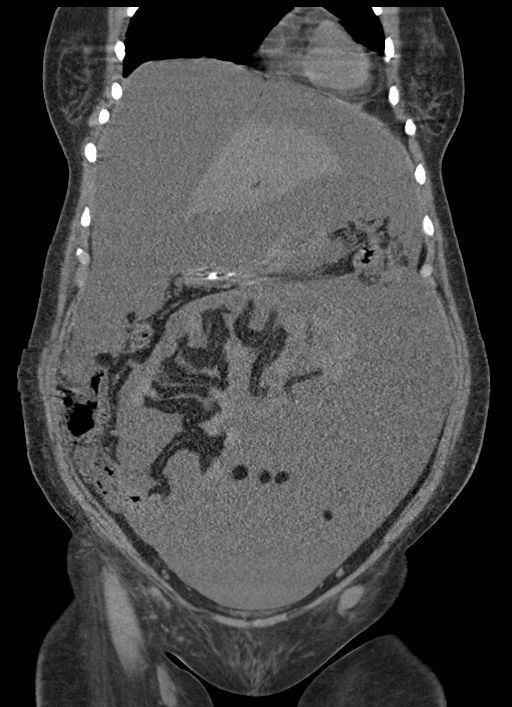
[im 61/109  soft-tissue]
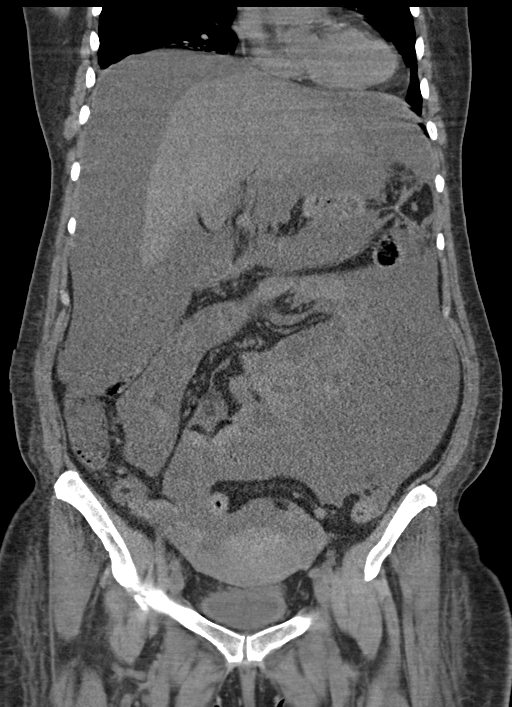

[15 of 46 positions shown; findings below may reference images not displayed]

FINDINGS: Lower chest: Motion artifact limits evaluation. No focal
consolidation. Several nodules demonstrated in the right lung base,
largest measuring 6 mm diameter. No change since previous study.

Hepatobiliary: No focal liver abnormality is seen. No gallstones,
gallbladder wall thickening, or biliary dilatation.

Pancreas: Unremarkable. No pancreatic ductal dilatation or
surrounding inflammatory changes.

Spleen: Normal in size without focal abnormality.

Adrenals/Urinary Tract: No adrenal gland nodules. Prominent atrophy
of the left kidney. No hydronephrosis in either kidney. No renal or
ureteral stones identified. Bladder wall is thickened, suggesting
possible cystitis. No bladder stones identified.

Stomach/Bowel: Stomach is within normal limits. Appendix appears
normal. No evidence of bowel wall thickening, distention, or
inflammatory changes.

Vascular/Lymphatic: Minimal scattered aortic calcification. No
aneurysm. Prominent retroperitoneal lymph nodes, measuring up to
about 12 mm short axis dimension. Prominent lymph nodes in the
groins. Left groin dominant lymph node measures about 1.4 cm
diameter. These are nonspecific but could indicate metastatic
lesions.

Reproductive: Uterus and ovaries are not enlarged. Focal increased
density within the uterine myometrium likely to represent a small
calcified fibroid.

Other: Prominent diffuse abdominal and pelvic ascites resulting in
severe abdominal distention. No free air. Suggestion of nodularity
in the omentum could indicate carcinomatosis. Correlation with
cytology from paracentesis done on 05/06/2017 indicates malignant
cells present consistent with carcinoma suggestive of gynecologic
primary. Abdominal wall musculature appears intact.

Musculoskeletal: Degenerative changes in the spine. No destructive
bone lesions.
IMPRESSION: 1. Abdominal distention due to prominent ascites diffusely
throughout the abdomen and pelvis. Omental nodularity consistent
with peritoneal carcinomatosis.
2. Right lung base nodules measuring up to 6 mm in diameter. No
change since previous study. Possible metastatic disease.
3. Moderately prominent lymph nodes in the retroperitoneum and groin
regions, possibly metastatic.
4. Uterus and ovaries are not enlarged. No definite primary lesion
identified. Consider follow-up with ultrasound or MRI after
paracentesis.
5. Asymmetric left renal atrophy.
6. Bladder wall thickening suggesting cystitis.
7. Aortic atherosclerosis.

## 2018-11-12 IMAGING — US US PARACENTESIS
1 series · 5 of 5 positions shown · non-contrast
Comparison: none

INDICATION: Patient with history of metastatic carcinoma of unknown origin,
recurrent malignant ascites. Request made for therapeutic
paracentesis up to 3-4 liters.

[Series 1: us paracentesis · 5 of 5 slices shown]
[im 1/5]
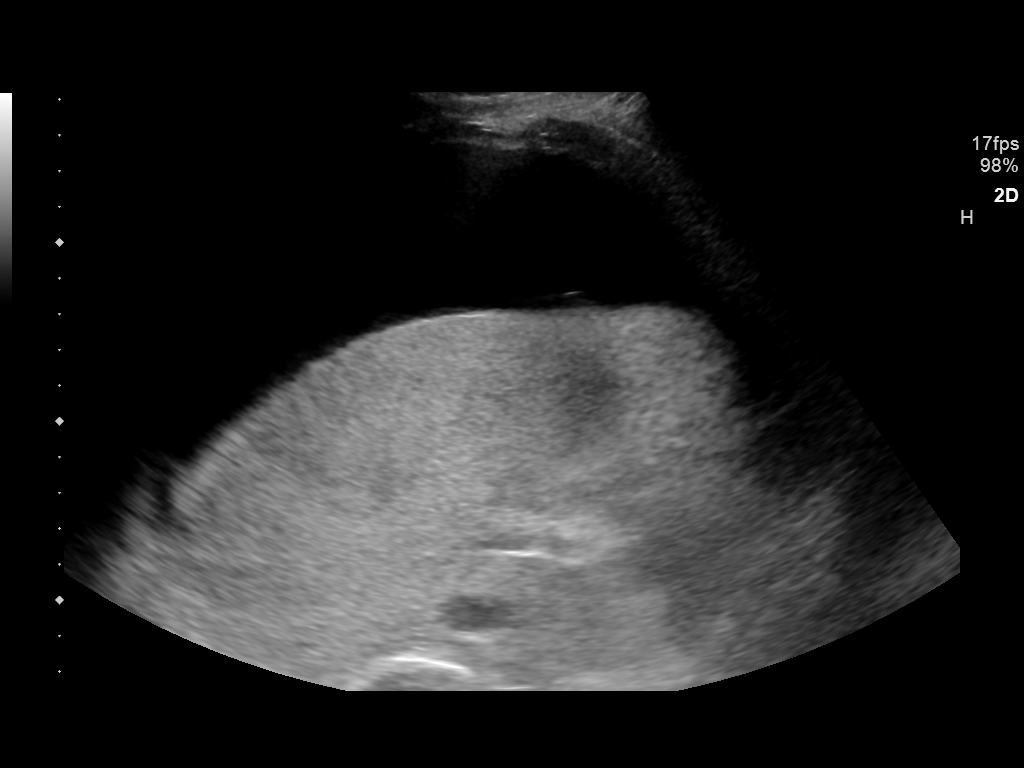
[im 2/5]
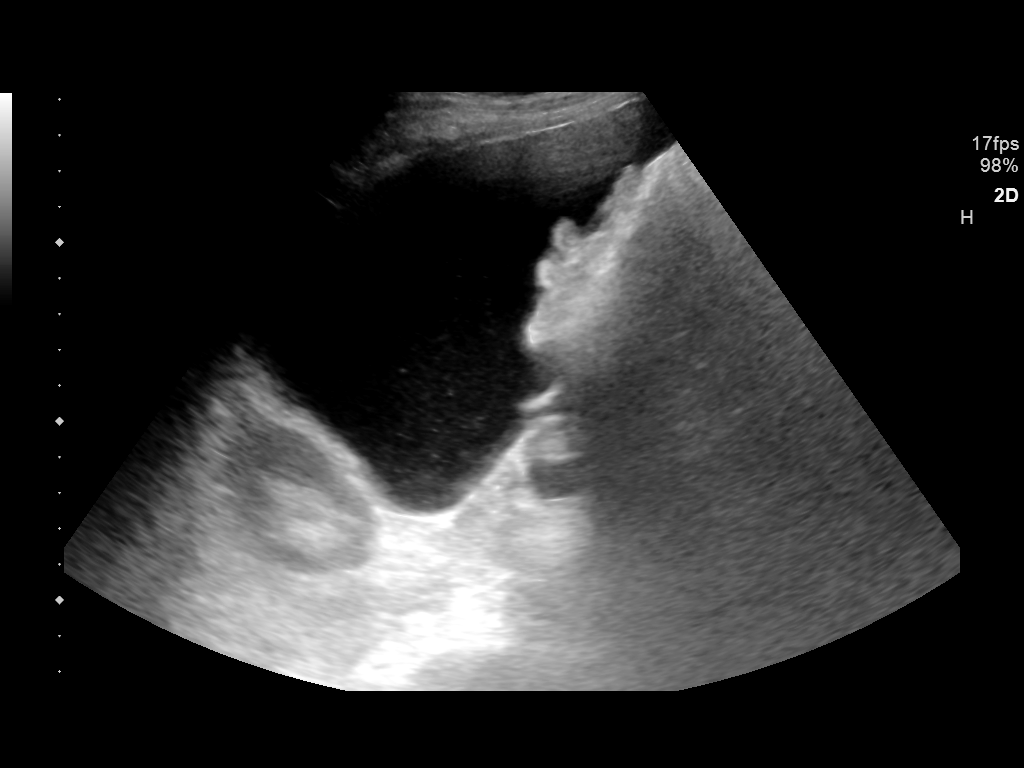
[im 3/5]
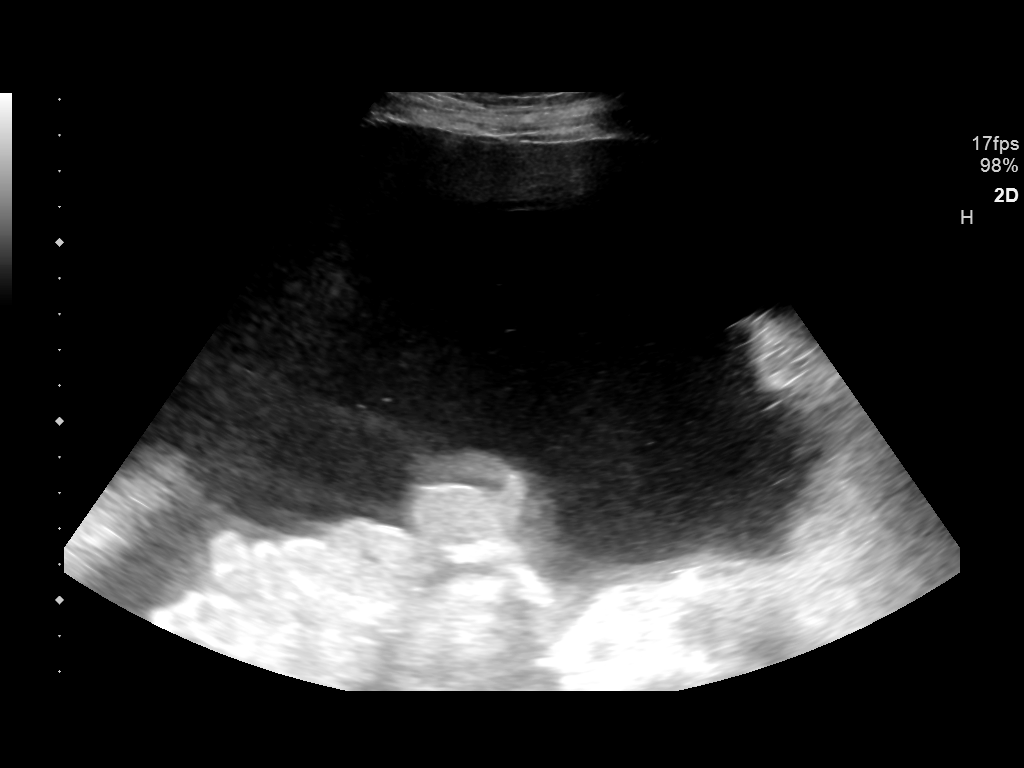
[im 4/5]
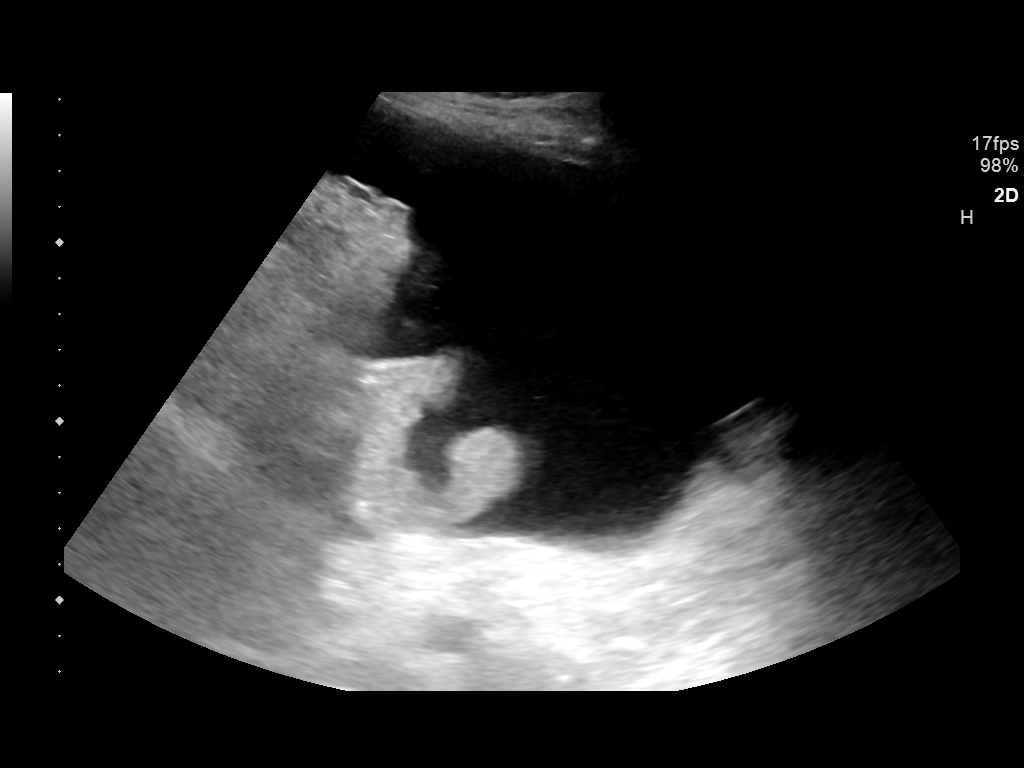
[im 5/5]
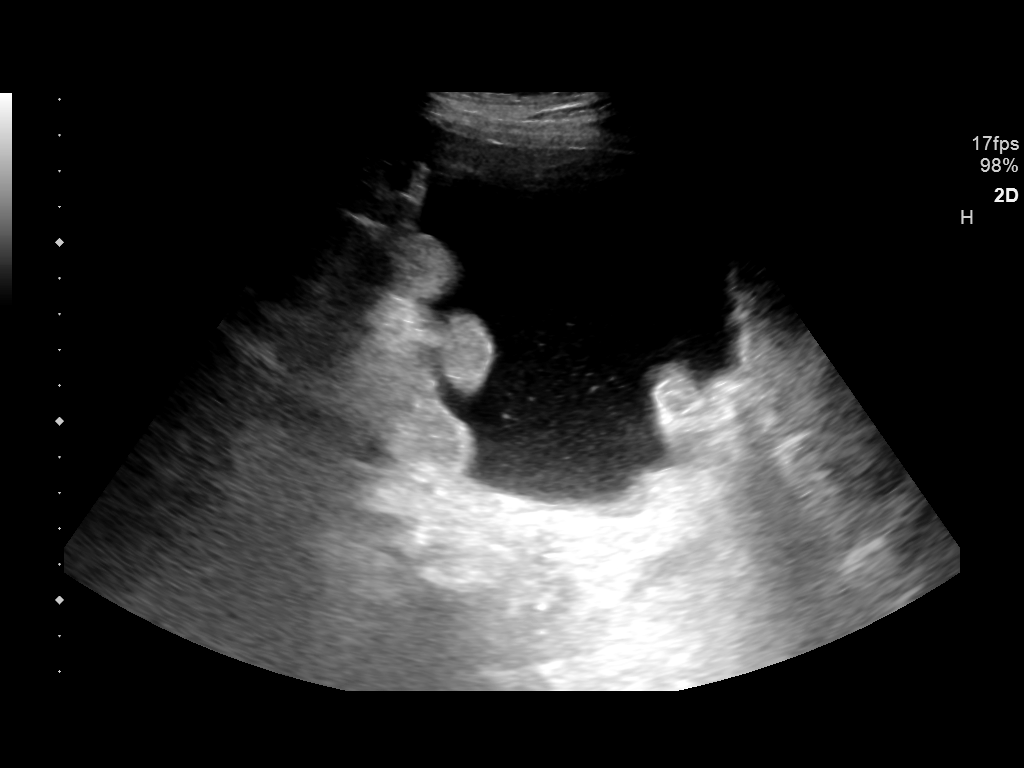

[5 of 5 positions shown; findings below may reference images not displayed]

EXAM:
ULTRASOUND GUIDED THERAPEUTIC PARACENTESIS

MEDICATIONS:
None

COMPLICATIONS:
None immediate.

PROCEDURE:
Informed written consent was obtained from the patient after a
discussion of the risks, benefits and alternatives to treatment. A
timeout was performed prior to the initiation of the procedure.

Initial ultrasound scanning demonstrates a moderate to large amount
of ascites within the left mid to lower abdominal quadrant. The left
mid to lower abdomen was prepped and draped in the usual sterile
fashion. 1% lidocaine was used for local anesthesia.

Following this, a 19 gauge, 7-cm, Yueh catheter was introduced. An
ultrasound image was saved for documentation purposes. The
paracentesis was performed. The catheter was removed and a dressing
was applied. The patient tolerated the procedure well without
immediate post procedural complication.
FINDINGS: A total of approximately 4 liters of yellow fluid was removed.
IMPRESSION: Successful ultrasound-guided therapeutic paracentesis yielding 4
liters of peritoneal fluid.
